# Patient Record
Sex: Female | Born: 1945 | Race: White | Hispanic: No | Smoking: Current some day smoker
Health system: Southern US, Community
[De-identification: ages and names within clinical notes are randomized; demographics above are authoritative.]

## PROBLEM LIST (undated history)

## (undated) DIAGNOSIS — R05 Cough: Secondary | ICD-10-CM

## (undated) DIAGNOSIS — K2270 Barrett's esophagus without dysplasia: Secondary | ICD-10-CM

## (undated) DIAGNOSIS — K2970 Gastritis, unspecified, without bleeding: Secondary | ICD-10-CM

## (undated) DIAGNOSIS — F329 Major depressive disorder, single episode, unspecified: Secondary | ICD-10-CM

## (undated) DIAGNOSIS — J439 Emphysema, unspecified: Secondary | ICD-10-CM

## (undated) DIAGNOSIS — K2980 Duodenitis without bleeding: Secondary | ICD-10-CM

## (undated) DIAGNOSIS — R51 Headache: Secondary | ICD-10-CM

## (undated) DIAGNOSIS — K224 Dyskinesia of esophagus: Secondary | ICD-10-CM

## (undated) DIAGNOSIS — F419 Anxiety disorder, unspecified: Secondary | ICD-10-CM

## (undated) DIAGNOSIS — C50919 Malignant neoplasm of unspecified site of unspecified female breast: Secondary | ICD-10-CM

## (undated) DIAGNOSIS — J449 Chronic obstructive pulmonary disease, unspecified: Secondary | ICD-10-CM

## (undated) DIAGNOSIS — I8393 Asymptomatic varicose veins of bilateral lower extremities: Secondary | ICD-10-CM

## (undated) DIAGNOSIS — F32A Depression, unspecified: Secondary | ICD-10-CM

## (undated) DIAGNOSIS — Z801 Family history of malignant neoplasm of trachea, bronchus and lung: Secondary | ICD-10-CM

## (undated) DIAGNOSIS — C50812 Malignant neoplasm of overlapping sites of left female breast: Secondary | ICD-10-CM

## (undated) DIAGNOSIS — R519 Headache, unspecified: Secondary | ICD-10-CM

## (undated) DIAGNOSIS — I700 Atherosclerosis of aorta: Secondary | ICD-10-CM

## (undated) DIAGNOSIS — R053 Chronic cough: Secondary | ICD-10-CM

## (undated) DIAGNOSIS — Z720 Tobacco use: Secondary | ICD-10-CM

## (undated) DIAGNOSIS — M48062 Spinal stenosis, lumbar region with neurogenic claudication: Secondary | ICD-10-CM

## (undated) DIAGNOSIS — Z170 Estrogen receptor positive status [ER+]: Secondary | ICD-10-CM

## (undated) DIAGNOSIS — K219 Gastro-esophageal reflux disease without esophagitis: Secondary | ICD-10-CM

## (undated) DIAGNOSIS — T7840XA Allergy, unspecified, initial encounter: Secondary | ICD-10-CM

## (undated) DIAGNOSIS — M810 Age-related osteoporosis without current pathological fracture: Secondary | ICD-10-CM

## (undated) DIAGNOSIS — R7303 Prediabetes: Secondary | ICD-10-CM

## (undated) DIAGNOSIS — M1990 Unspecified osteoarthritis, unspecified site: Secondary | ICD-10-CM

## (undated) DIAGNOSIS — Z808 Family history of malignant neoplasm of other organs or systems: Secondary | ICD-10-CM

## (undated) DIAGNOSIS — K222 Esophageal obstruction: Secondary | ICD-10-CM

## (undated) DIAGNOSIS — G8929 Other chronic pain: Secondary | ICD-10-CM

## (undated) DIAGNOSIS — Z800 Family history of malignant neoplasm of digestive organs: Secondary | ICD-10-CM

## (undated) HISTORY — PX: APPENDECTOMY: SHX54

## (undated) HISTORY — PX: EYE SURGERY: SHX253

## (undated) HISTORY — DX: Family history of malignant neoplasm of trachea, bronchus and lung: Z80.1

## (undated) HISTORY — PX: CHOLECYSTECTOMY: SHX55

## (undated) HISTORY — DX: Family history of malignant neoplasm of other organs or systems: Z80.8

## (undated) HISTORY — PX: HERNIA REPAIR: SHX51

## (undated) HISTORY — PX: FRACTURE SURGERY: SHX138

## (undated) HISTORY — DX: Family history of malignant neoplasm of digestive organs: Z80.0

---

## 2012-08-07 ENCOUNTER — Emergency Department: Payer: Self-pay | Admitting: Emergency Medicine

## 2012-08-07 LAB — CBC
HCT: 46.7 % (ref 35.0–47.0)
HGB: 15.6 g/dL (ref 12.0–16.0)
MCH: 30.4 pg (ref 26.0–34.0)
MCHC: 33.5 g/dL (ref 32.0–36.0)
MCV: 91 fL (ref 80–100)
Platelet: 272 10*3/uL (ref 150–440)
RBC: 5.15 10*6/uL (ref 3.80–5.20)
RDW: 13.5 % (ref 11.5–14.5)
WBC: 11.3 10*3/uL — ABNORMAL HIGH (ref 3.6–11.0)

## 2012-08-07 LAB — BASIC METABOLIC PANEL
Anion Gap: 7 (ref 7–16)
BUN: 12 mg/dL (ref 7–18)
Calcium, Total: 9.4 mg/dL (ref 8.5–10.1)
Chloride: 107 mmol/L (ref 98–107)
Co2: 28 mmol/L (ref 21–32)
Creatinine: 0.97 mg/dL (ref 0.60–1.30)
EGFR (African American): >60
EGFR (Non-African Amer.): >60
Glucose: 102 mg/dL — ABNORMAL HIGH (ref 65–99)
Osmolality: 283 (ref 275–301)
Potassium: 4.2 mmol/L (ref 3.5–5.1)
Sodium: 142 mmol/L (ref 136–145)

## 2012-08-07 LAB — CK TOTAL AND CKMB (NOT AT ARMC)
CK, Total: 51 U/L (ref 21–215)
CK-MB: 0.7 ng/mL (ref 0.5–3.6)

## 2012-08-07 LAB — TROPONIN I
Troponin-I: <0.02 ng/mL
Troponin-I: <0.02 ng/mL

## 2012-11-24 ENCOUNTER — Ambulatory Visit: Payer: Self-pay | Admitting: Gastroenterology

## 2012-11-27 LAB — PATHOLOGY REPORT

## 2012-12-05 ENCOUNTER — Ambulatory Visit: Payer: Self-pay | Admitting: Internal Medicine

## 2013-01-03 ENCOUNTER — Ambulatory Visit: Payer: Self-pay | Admitting: Internal Medicine

## 2013-08-07 ENCOUNTER — Ambulatory Visit: Payer: Self-pay | Admitting: Internal Medicine

## 2014-08-14 ENCOUNTER — Ambulatory Visit: Payer: Self-pay | Admitting: Internal Medicine

## 2014-08-21 ENCOUNTER — Ambulatory Visit: Payer: Self-pay | Admitting: Internal Medicine

## 2014-11-14 DIAGNOSIS — T7840XA Allergy, unspecified, initial encounter: Secondary | ICD-10-CM | POA: Diagnosis not present

## 2014-11-14 DIAGNOSIS — G2581 Restless legs syndrome: Secondary | ICD-10-CM | POA: Diagnosis not present

## 2014-11-14 DIAGNOSIS — J3089 Other allergic rhinitis: Secondary | ICD-10-CM | POA: Diagnosis not present

## 2014-11-14 DIAGNOSIS — Z720 Tobacco use: Secondary | ICD-10-CM | POA: Diagnosis not present

## 2015-01-07 DIAGNOSIS — J209 Acute bronchitis, unspecified: Secondary | ICD-10-CM | POA: Diagnosis not present

## 2015-01-13 DIAGNOSIS — J209 Acute bronchitis, unspecified: Secondary | ICD-10-CM | POA: Diagnosis not present

## 2015-01-13 DIAGNOSIS — F328 Other depressive episodes: Secondary | ICD-10-CM | POA: Diagnosis not present

## 2015-02-21 ENCOUNTER — Ambulatory Visit
Admit: 2015-02-21 | Disposition: A | Discharge status: Home / Self Care | Payer: Self-pay | Attending: Internal Medicine | Admitting: Internal Medicine

## 2015-02-21 DIAGNOSIS — N6489 Other specified disorders of breast: Secondary | ICD-10-CM | POA: Diagnosis not present

## 2015-02-21 DIAGNOSIS — N63 Unspecified lump in breast: Secondary | ICD-10-CM | POA: Diagnosis not present

## 2015-03-12 DIAGNOSIS — R600 Localized edema: Secondary | ICD-10-CM | POA: Diagnosis not present

## 2015-03-12 DIAGNOSIS — J302 Other seasonal allergic rhinitis: Secondary | ICD-10-CM | POA: Diagnosis not present

## 2015-03-12 DIAGNOSIS — Z720 Tobacco use: Secondary | ICD-10-CM | POA: Diagnosis not present

## 2015-03-12 DIAGNOSIS — R05 Cough: Secondary | ICD-10-CM | POA: Diagnosis not present

## 2015-04-03 DIAGNOSIS — H5210 Myopia, unspecified eye: Secondary | ICD-10-CM | POA: Diagnosis not present

## 2015-04-03 DIAGNOSIS — Z0100 Encounter for examination of eyes and vision without abnormal findings: Secondary | ICD-10-CM | POA: Diagnosis not present

## 2015-04-03 DIAGNOSIS — H524 Presbyopia: Secondary | ICD-10-CM | POA: Diagnosis not present

## 2015-07-21 DIAGNOSIS — F328 Other depressive episodes: Secondary | ICD-10-CM | POA: Diagnosis not present

## 2015-07-21 DIAGNOSIS — Z720 Tobacco use: Secondary | ICD-10-CM | POA: Diagnosis not present

## 2015-07-21 DIAGNOSIS — R05 Cough: Secondary | ICD-10-CM | POA: Diagnosis not present

## 2015-07-21 DIAGNOSIS — G2581 Restless legs syndrome: Secondary | ICD-10-CM | POA: Diagnosis not present

## 2015-07-21 DIAGNOSIS — J302 Other seasonal allergic rhinitis: Secondary | ICD-10-CM | POA: Diagnosis not present

## 2015-07-21 DIAGNOSIS — K22719 Barrett's esophagus with dysplasia, unspecified: Secondary | ICD-10-CM | POA: Diagnosis not present

## 2015-07-21 DIAGNOSIS — Z79899 Other long term (current) drug therapy: Secondary | ICD-10-CM | POA: Diagnosis not present

## 2015-07-21 DIAGNOSIS — R600 Localized edema: Secondary | ICD-10-CM | POA: Diagnosis not present

## 2015-08-06 ENCOUNTER — Other Ambulatory Visit: Payer: Self-pay | Admitting: Internal Medicine

## 2015-08-06 DIAGNOSIS — T7840XD Allergy, unspecified, subsequent encounter: Secondary | ICD-10-CM | POA: Diagnosis not present

## 2015-08-06 DIAGNOSIS — Z0000 Encounter for general adult medical examination without abnormal findings: Secondary | ICD-10-CM | POA: Diagnosis not present

## 2015-08-06 DIAGNOSIS — R05 Cough: Secondary | ICD-10-CM | POA: Diagnosis not present

## 2015-08-06 DIAGNOSIS — N6320 Unspecified lump in the left breast, unspecified quadrant: Secondary | ICD-10-CM

## 2015-08-06 DIAGNOSIS — K293 Chronic superficial 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: Secondary | ICD-10-CM | POA: Diagnosis not present

## 2015-08-06 DIAGNOSIS — N63 Unspecified lump in breast: Secondary | ICD-10-CM | POA: Diagnosis not present

## 2015-08-06 DIAGNOSIS — F3289 Other specified depressive episodes: Secondary | ICD-10-CM | POA: Diagnosis not present

## 2015-08-06 DIAGNOSIS — Z23 Encounter for immunization: Secondary | ICD-10-CM | POA: Diagnosis not present

## 2015-08-06 DIAGNOSIS — Z720 Tobacco use: Secondary | ICD-10-CM | POA: Diagnosis not present

## 2015-08-06 DIAGNOSIS — J439 Emphysema, unspecified: Secondary | ICD-10-CM | POA: Diagnosis not present

## 2015-08-25 DIAGNOSIS — R197 Diarrhea, unspecified: Secondary | ICD-10-CM | POA: Diagnosis not present

## 2015-08-25 DIAGNOSIS — R110 Nausea: Secondary | ICD-10-CM | POA: Diagnosis not present

## 2015-08-25 DIAGNOSIS — K219 Gastro-esophageal reflux disease without esophagitis: Secondary | ICD-10-CM | POA: Diagnosis not present

## 2015-08-27 ENCOUNTER — Other Ambulatory Visit: Payer: Self-pay | Admitting: Internal Medicine

## 2015-08-27 ENCOUNTER — Ambulatory Visit
Admission: RE | Admit: 2015-08-27 | Discharge: 2015-08-27 | Disposition: A | Discharge status: Home / Self Care | Payer: Commercial Managed Care - HMO | Source: Ambulatory Visit | Attending: Internal Medicine | Admitting: Internal Medicine

## 2015-08-27 DIAGNOSIS — N6320 Unspecified lump in the left breast, unspecified quadrant: Secondary | ICD-10-CM

## 2015-08-27 DIAGNOSIS — N63 Unspecified lump in breast: Secondary | ICD-10-CM | POA: Insufficient documentation

## 2015-09-02 DIAGNOSIS — H2513 Age-related nuclear cataract, bilateral: Secondary | ICD-10-CM | POA: Diagnosis not present

## 2015-10-13 ENCOUNTER — Encounter: Payer: Self-pay | Admitting: *Deleted

## 2015-10-14 ENCOUNTER — Ambulatory Visit: Payer: Commercial Managed Care - HMO | Admitting: Anesthesiology

## 2015-10-14 ENCOUNTER — Encounter: Payer: Self-pay | Admitting: *Deleted

## 2015-10-14 ENCOUNTER — Ambulatory Visit
Admission: RE | Admit: 2015-10-14 | Discharge: 2015-10-14 | Disposition: A | Discharge status: Home / Self Care | Payer: Commercial Managed Care - HMO | Source: Ambulatory Visit | Attending: Gastroenterology | Admitting: Gastroenterology

## 2015-10-14 ENCOUNTER — Encounter
Admission: RE | Disposition: A | Discharge status: Home / Self Care | Payer: Self-pay | Source: Ambulatory Visit | Attending: Gastroenterology

## 2015-10-14 DIAGNOSIS — Z79899 Other long term (current) drug therapy: Secondary | ICD-10-CM | POA: Diagnosis not present

## 2015-10-14 DIAGNOSIS — R110 Nausea: Secondary | ICD-10-CM | POA: Diagnosis not present

## 2015-10-14 DIAGNOSIS — K296 Other 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: Secondary | ICD-10-CM | POA: Diagnosis not present

## 2015-10-14 DIAGNOSIS — K2980 Duodenitis without bleeding: Secondary | ICD-10-CM | POA: Insufficient documentation

## 2015-10-14 DIAGNOSIS — K219 Gastro-esophageal reflux disease without esophagitis: Secondary | ICD-10-CM | POA: Insufficient documentation

## 2015-10-14 DIAGNOSIS — F329 Major depressive disorder, single episode, unspecified: Secondary | ICD-10-CM | POA: Insufficient documentation

## 2015-10-14 DIAGNOSIS — K2970 Gastritis, unspecified, without bleeding: Secondary | ICD-10-CM | POA: Diagnosis not present

## 2015-10-14 DIAGNOSIS — K295 Unspecified chronic 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: Secondary | ICD-10-CM | POA: Insufficient documentation

## 2015-10-14 DIAGNOSIS — Q438 Other specified congenital malformations of intestine: Secondary | ICD-10-CM | POA: Diagnosis not present

## 2015-10-14 HISTORY — DX: Barrett's esophagus without dysplasia: K22.70

## 2015-10-14 HISTORY — DX: Depression, unspecified: F32.A

## 2015-10-14 HISTORY — DX: Major depressive disorder, single episode, unspecified: F32.9

## 2015-10-14 HISTORY — DX: Allergy, unspecified, initial encounter: T78.40XA

## 2015-10-14 HISTORY — DX: Gastro-esophageal reflux disease without esophagitis: K21.9

## 2015-10-14 HISTORY — PX: ESOPHAGOGASTRODUODENOSCOPY (EGD) WITH PROPOFOL: SHX5813

## 2015-10-14 SURGERY — ESOPHAGOGASTRODUODENOSCOPY (EGD) WITH PROPOFOL
Anesthesia: General

## 2015-10-14 MED ORDER — PROPOFOL 500 MG/50ML IV EMUL
INTRAVENOUS | Status: DC | PRN
  Administered 2015-10-14: 150 ug/kg/min via INTRAVENOUS

## 2015-10-14 MED ORDER — FENTANYL CITRATE (PF) 100 MCG/2ML IJ SOLN
INTRAMUSCULAR | Status: DC | PRN
  Administered 2015-10-14: 25 ug via INTRAVENOUS

## 2015-10-14 MED ORDER — SODIUM CHLORIDE 0.9 % IV SOLN
INTRAVENOUS | Status: DC
  Administered 2015-10-14 (×2): via INTRAVENOUS

## 2015-10-14 MED ORDER — SODIUM CHLORIDE 0.9 % IV SOLN: INTRAVENOUS | Status: DC

## 2015-10-14 MED ORDER — LIDOCAINE HCL (PF) 2 % IJ SOLN
INTRAMUSCULAR | Status: DC | PRN
  Administered 2015-10-14: 60 mg via INTRADERMAL

## 2015-10-14 MED ORDER — PROPOFOL 10 MG/ML IV BOLUS
INTRAVENOUS | Status: DC | PRN
  Administered 2015-10-14: 40 mg via INTRAVENOUS
  Administered 2015-10-14: 20 mg via INTRAVENOUS

## 2015-10-14 NOTE — Anesthesia Preprocedure Evaluation (Signed)
Anesthesia Evaluation  Patient identified by MRN, date of birth, ID band Patient awake    Reviewed: Allergy & Precautions, NPO status , Patient's Chart, lab work & pertinent test results, reviewed documented beta blocker date and time   Airway Mallampati: II  TM Distance: >3 FB     Dental  (+) Chipped   Pulmonary Current Smoker,           Cardiovascular      Neuro/Psych PSYCHIATRIC DISORDERS Depression    GI/Hepatic GERD  ,  Endo/Other    Renal/GU      Musculoskeletal   Abdominal   Peds  Hematology   Anesthesia Other Findings   Reproductive/Obstetrics                             Anesthesia Physical Anesthesia Plan  ASA: III  Anesthesia Plan: General   Post-op Pain Management:    Induction:   Airway Management Planned:   Additional Equipment:   Intra-op Plan:   Post-operative Plan:   Informed Consent: I have reviewed the patients History and Physical, chart, labs and discussed the procedure including the risks, benefits and alternatives for the proposed anesthesia with the patient or authorized representative who has indicated his/her understanding and acceptance.     Plan Discussed with: CRNA  Anesthesia Plan Comments:         Anesthesia Quick Evaluation

## 2015-10-14 NOTE — Anesthesia Postprocedure Evaluation (Signed)
Anesthesia Post Note  Patient: Morgan Valdez  Procedure(s) Performed: Procedure(s) (LRB): ESOPHAGOGASTRODUODENOSCOPY (EGD) WITH PROPOFOL (N/A)  Patient location during evaluation: Endoscopy Anesthesia Type: General Level of consciousness: awake Pain management: pain level controlled Vital Signs Assessment: post-procedure vital signs reviewed and stable Respiratory status: spontaneous breathing Cardiovascular status: blood pressure returned to baseline Anesthetic complications: no    Last Vitals:  Filed Vitals:   10/14/15 0900 10/14/15 0910  BP: 102/89 119/65  Pulse: 84 86  Temp:    Resp: 23 21    Last Pain: There were no vitals filed for this visit.               Harshil Cavallaro S

## 2015-10-14 NOTE — Transfer of Care (Signed)
Immediate Anesthesia Transfer of Care Note  Patient: Morgan Valdez  Procedure(s) Performed: Procedure(s): ESOPHAGOGASTRODUODENOSCOPY (EGD) WITH PROPOFOL (N/A)  Patient Location: PACU  Anesthesia Type:General  Level of Consciousness: awake  Airway & Oxygen Therapy: Patient Spontanous Breathing and Patient connected to nasal cannula oxygen  Post-op Assessment: Report given to RN and Post -op Vital signs reviewed and stable  Post vital signs: Reviewed and stable  Last Vitals:  Filed Vitals:   10/14/15 0838 10/14/15 0839  BP: 162/118 162/118  Pulse: 88 88  Temp:    Resp: 14 14    Complications: No apparent anesthesia complications

## 2015-10-14 NOTE — H&P (Signed)
Outpatient short stay form Pre-procedure 10/14/2015 8:05 AM Lollie Sails MD  Primary Physician: Dr. Ginette Pitman  Reason for visit:  EGD  History of present illness:  Patient is a 69 year old female presenting today for EGD. She has had problems with increasing reflux symptoms and switch from a previous medication 1 does not seem be as effective. Also has some nausea perhaps twice a week in the mornings. Is having a history of cholecystectomy in the late 1990s.  She takes no aspirin or blood thinning medications. She was prescribed some Carafate for about a month.   Current facility-administered medications:  .  0.9 %  sodium chloride infusion, , Intravenous, Continuous, Lollie Sails, MD, Last Rate: 20 mL/hr at 10/14/15 0745 .  0.9 %  sodium chloride infusion, , Intravenous, Continuous, Lollie Sails, MD  Prescriptions prior to admission  Medication Sig Dispense Refill Last Dose  . albuterol (PROVENTIL HFA;VENTOLIN HFA) 108 (90 BASE) MCG/ACT inhaler Inhale 2 puffs into the lungs every 6 (six) hours as needed for wheezing or shortness of breath.     . cetirizine (ZYRTEC) 10 MG tablet Take 10 mg by mouth daily.     . fluticasone (FLONASE) 50 MCG/ACT nasal spray Place 2 sprays into both nostrils daily.     . furosemide (LASIX) 20 MG tablet Take 20 mg by mouth daily.     . montelukast (SINGULAIR) 10 MG tablet Take 10 mg by mouth at bedtime.     . pantoprazole (PROTONIX) 40 MG tablet Take 40 mg by mouth daily.     . pramipexole (MIRAPEX) 0.5 MG tablet Take 0.5 mg by mouth 3 (three) times daily.     Marland Kitchen rOPINIRole (REQUIP) 0.25 MG tablet Take 0.25 mg by mouth daily.     . sertraline (ZOLOFT) 50 MG tablet Take 25 mg by mouth daily.     . sucralfate (CARAFATE) 1 G tablet Take 1 g by mouth 4 (four) times daily -  with meals and at bedtime.        Allergies  Allergen Reactions  . Bacitracin      Past Medical History  Diagnosis Date  . Barrett's esophagus   . Depression   .  Allergic state   . GERD (gastroesophageal reflux disease)     Review of systems:      Physical Exam    Heart and lungs: Regular rate and rhythm without rub or gallop, lungs are bilaterally clear.    HEENT: Normocephalic atraumatic eyes are anicteric    Other:     Pertinant exam for procedure: Soft nontender nondistended bowel sounds positive normoactive    Planned proceedures: EGD and indicated procedures. I have discussed the risks benefits and complications of procedures to include not limited to bleeding, infection, perforation and the risk of sedation and the patient wishes to proceed.    Lollie Sails, MD Gastroenterology 10/14/2015  8:05 AM

## 2015-10-14 NOTE — Op Note (Signed)
[facility] Patient Name: Morgan Valdez Procedure Date: 10/14/2015 8:08 AM MRN: [id number] Account #: [id number] Date of Birth: 01/07/1946 Admit Type: Outpatient Age: 69 Room: [facility] ENDO ROOM 3 Gender: Female Note Status: Finalized Procedure:         Upper GI endoscopy Indications:       Gastro-esophageal reflux disease, Nausea Providers:         Lollie Sails, MD Referring MD:      Tracie Harrier, MD (Referring MD) Medicines:         Monitored Anesthesia Care Complications:     No immediate complications. Procedure:         Pre-Anesthesia Assessment:                    - ASA Grade Assessment: III - A patient with severe                     systemic disease.                    After obtaining informed consent, the endoscope was passed                     under direct vision. Throughout the procedure, the                     patient's blood pressure, pulse, and oxygen saturations                     were monitored continuously. The Endoscope was introduced                     through the mouth, and advanced to the third part of                     duodenum. The upper GI endoscopy was accomplished without                     difficulty. The patient tolerated the procedure well. Findings:      The Z-line was variable. Biopsies were taken with a cold forceps for       histology.      Abnormal motility was noted in the lower third of the esophagus. The       cricopharyngeus was normal. There is spasticity of the esophageal body.       The distal esophagus/lower esophageal sphincter is open. Tertiary       peristaltic waves are noted.      Diffuse and patchy mild inflammation characterized by congestion (edema)       and erythema was found in the gastric body and in the gastric antrum.       Biopsies were taken with a cold forceps for histology.      Patchy mucosal flattening was found at 2nd part of the duodenum and at       3rd part of the  duodenum. Biopsies were taken with a cold forceps for       histology.      Localized mild inflammation characterized by erythema was found in the       duodenal bulb. Biopsies were taken with a cold forceps for histology. Impression:        - Z-line variable. Biopsied.                    -  Esophageal motility disorder.                    - Gastritis. Biopsied.                    - Flattened mucosa was found in the duodenum, not                     consistent with celiac disease. Biopsied.                    - Duodenitis. Biopsied. Recommendation:    - Discharge patient to home.                    - Use Prilosec (omeprazole) 40 mg PO daily daily.                    - Return to GI clinic in 1 month. Procedure Code(s): --- Professional ---                    [phone], Esophagogastroduodenoscopy, flexible, transoral;                     with biopsy, single or multiple Diagnosis Code(s): --- Professional ---                    K22.8, Other specified diseases of esophagus                    K22.4, Dyskinesia of esophagus                    K29.70, Gastritis, unspecified, without bleeding                    K31.89, Other diseases of stomach and duodenum                    K29.80, Duodenitis without bleeding                    K21.9, Gastro-esophageal reflux disease without esophagitis                    R11.0, Nausea CPT copyright 2014 [employer]. All rights reserved. The codes documented in this report are preliminary and upon coder review may  be revised to meet current compliance requirements. Lollie Sails, MD 10/14/2015 8:43:44 AM This report has been signed electronically. Number of Addenda: 0 Note Initiated On: 10/14/2015 8:08 AM      [facility]

## 2015-10-15 ENCOUNTER — Encounter: Payer: Self-pay | Admitting: Gastroenterology

## 2015-10-16 LAB — SURGICAL PATHOLOGY

## 2015-11-12 DIAGNOSIS — H04123 Dry eye syndrome of bilateral lacrimal glands: Secondary | ICD-10-CM | POA: Diagnosis not present

## 2015-12-03 DIAGNOSIS — K2970 Gastritis, unspecified, without bleeding: Secondary | ICD-10-CM | POA: Diagnosis not present

## 2015-12-10 DIAGNOSIS — M545 Low back pain: Secondary | ICD-10-CM | POA: Diagnosis not present

## 2015-12-10 DIAGNOSIS — G2581 Restless legs syndrome: Secondary | ICD-10-CM | POA: Diagnosis not present

## 2015-12-10 DIAGNOSIS — Z720 Tobacco use: Secondary | ICD-10-CM | POA: Diagnosis not present

## 2015-12-10 DIAGNOSIS — F325 Major depressive disorder, single episode, in full remission: Secondary | ICD-10-CM | POA: Diagnosis not present

## 2015-12-10 DIAGNOSIS — G8929 Other chronic pain: Secondary | ICD-10-CM | POA: Diagnosis not present

## 2015-12-10 DIAGNOSIS — E7800 Pure hypercholesterolemia, unspecified: Secondary | ICD-10-CM | POA: Diagnosis not present

## 2016-04-01 DIAGNOSIS — G8929 Other chronic pain: Secondary | ICD-10-CM | POA: Diagnosis not present

## 2016-04-01 DIAGNOSIS — M545 Low back pain: Secondary | ICD-10-CM | POA: Diagnosis not present

## 2016-04-01 DIAGNOSIS — G2581 Restless legs syndrome: Secondary | ICD-10-CM | POA: Diagnosis not present

## 2016-04-01 DIAGNOSIS — E7800 Pure hypercholesterolemia, unspecified: Secondary | ICD-10-CM | POA: Diagnosis not present

## 2016-04-01 DIAGNOSIS — Z720 Tobacco use: Secondary | ICD-10-CM | POA: Diagnosis not present

## 2016-04-08 DIAGNOSIS — F3289 Other specified depressive episodes: Secondary | ICD-10-CM | POA: Diagnosis not present

## 2016-04-08 DIAGNOSIS — G2581 Restless legs syndrome: Secondary | ICD-10-CM | POA: Diagnosis not present

## 2016-04-08 DIAGNOSIS — G8929 Other chronic pain: Secondary | ICD-10-CM | POA: Diagnosis not present

## 2016-04-08 DIAGNOSIS — Z720 Tobacco use: Secondary | ICD-10-CM | POA: Diagnosis not present

## 2016-04-08 DIAGNOSIS — M545 Low back pain: Secondary | ICD-10-CM | POA: Diagnosis not present

## 2016-04-08 DIAGNOSIS — J309 Allergic rhinitis, unspecified: Secondary | ICD-10-CM | POA: Diagnosis not present

## 2016-04-08 DIAGNOSIS — N393 Stress incontinence (female) (male): Secondary | ICD-10-CM | POA: Diagnosis not present

## 2016-05-04 ENCOUNTER — Encounter: Payer: Self-pay | Admitting: Emergency Medicine

## 2016-05-04 ENCOUNTER — Emergency Department
Admission: EM | Admit: 2016-05-04 | Discharge: 2016-05-04 | Disposition: A | Discharge status: Home / Self Care | Payer: Commercial Managed Care - HMO | Attending: Emergency Medicine | Admitting: Emergency Medicine

## 2016-05-04 ENCOUNTER — Emergency Department: Payer: Commercial Managed Care - HMO

## 2016-05-04 DIAGNOSIS — R05 Cough: Secondary | ICD-10-CM | POA: Diagnosis not present

## 2016-05-04 DIAGNOSIS — Z79899 Other long term (current) drug therapy: Secondary | ICD-10-CM | POA: Diagnosis not present

## 2016-05-04 DIAGNOSIS — F17200 Nicotine dependence, unspecified, uncomplicated: Secondary | ICD-10-CM | POA: Insufficient documentation

## 2016-05-04 DIAGNOSIS — R0602 Shortness of breath: Secondary | ICD-10-CM | POA: Diagnosis not present

## 2016-05-04 DIAGNOSIS — R0789 Other chest pain: Secondary | ICD-10-CM | POA: Diagnosis not present

## 2016-05-04 DIAGNOSIS — Z7951 Long term (current) use of inhaled steroids: Secondary | ICD-10-CM | POA: Insufficient documentation

## 2016-05-04 DIAGNOSIS — F329 Major depressive disorder, single episode, unspecified: Secondary | ICD-10-CM | POA: Diagnosis not present

## 2016-05-04 DIAGNOSIS — R079 Chest pain, unspecified: Secondary | ICD-10-CM | POA: Diagnosis not present

## 2016-05-04 DIAGNOSIS — R059 Cough, unspecified: Secondary | ICD-10-CM

## 2016-05-04 DIAGNOSIS — Z8719 Personal history of other diseases of the digestive system: Secondary | ICD-10-CM | POA: Diagnosis not present

## 2016-05-04 LAB — COMPREHENSIVE METABOLIC PANEL
ALT: 20 U/L (ref 14–54)
AST: 21 U/L (ref 15–41)
Albumin: 4.1 g/dL (ref 3.5–5.0)
Alkaline Phosphatase: 66 U/L (ref 38–126)
Anion gap: 8 (ref 5–15)
BUN: 13 mg/dL (ref 6–20)
CO2: 27 mmol/L (ref 22–32)
Calcium: 9.2 mg/dL (ref 8.9–10.3)
Chloride: 103 mmol/L (ref 101–111)
Creatinine, Ser: 0.87 mg/dL (ref 0.44–1.00)
GFR calc Af Amer: >60 mL/min (ref >60)
GFR calc non Af Amer: >60 mL/min (ref >60)
Glucose, Bld: 114 mg/dL — ABNORMAL HIGH (ref 65–99)
Potassium: 4 mmol/L (ref 3.5–5.1)
Sodium: 138 mmol/L (ref 135–145)
Total Bilirubin: 0.5 mg/dL (ref 0.3–1.2)
Total Protein: 7.1 g/dL (ref 6.5–8.1)

## 2016-05-04 LAB — TROPONIN I
Troponin I: <0.03 ng/mL (ref <0.03)
Troponin I: <0.03 ng/mL (ref <0.03)

## 2016-05-04 LAB — CBC WITH DIFFERENTIAL/PLATELET
Basophils Absolute: 0.1 10*3/uL (ref 0–0.1)
Basophils Relative: 1 %
Eosinophils Absolute: 0.1 10*3/uL (ref 0–0.7)
Eosinophils Relative: 1 %
HCT: 48.3 % — ABNORMAL HIGH (ref 35.0–47.0)
Hemoglobin: 16.4 g/dL — ABNORMAL HIGH (ref 12.0–16.0)
Lymphocytes Relative: 25 %
Lymphs Abs: 2.7 10*3/uL (ref 1.0–3.6)
MCH: 30.2 pg (ref 26.0–34.0)
MCHC: 34 g/dL (ref 32.0–36.0)
MCV: 89.1 fL (ref 80.0–100.0)
Monocytes Absolute: 0.8 10*3/uL (ref 0.2–0.9)
Monocytes Relative: 7 %
Neutro Abs: 7 10*3/uL — ABNORMAL HIGH (ref 1.4–6.5)
Neutrophils Relative %: 66 %
Platelets: 244 10*3/uL (ref 150–440)
RBC: 5.42 MIL/uL — ABNORMAL HIGH (ref 3.80–5.20)
RDW: 14.2 % (ref 11.5–14.5)
WBC: 10.6 10*3/uL (ref 3.6–11.0)

## 2016-05-04 LAB — FIBRIN DERIVATIVES D-DIMER (ARMC ONLY): Fibrin derivatives D-dimer (ARMC): 416 (ref 0–499)

## 2016-05-04 LAB — PROTIME-INR
INR: 0.99
Prothrombin Time: 13.3 seconds (ref 11.4–15.0)

## 2016-05-04 LAB — LIPASE, BLOOD: Lipase: 20 U/L (ref 11–51)

## 2016-05-04 MED ORDER — ASPIRIN 81 MG PO CHEW
324.0000 mg | CHEWABLE_TABLET | Freq: Once | ORAL | Status: AC
  Administered 2016-05-04: 324 mg via ORAL
  Filled 2016-05-04: qty 4

## 2016-05-04 MED ORDER — ALBUTEROL SULFATE (2.5 MG/3ML) 0.083% IN NEBU
5.0000 mg | INHALATION_SOLUTION | Freq: Once | RESPIRATORY_TRACT | Status: AC
  Administered 2016-05-04: 5 mg via RESPIRATORY_TRACT
  Filled 2016-05-04: qty 6

## 2016-05-04 MED ORDER — IPRATROPIUM BROMIDE 0.02 % IN SOLN
0.5000 mg | Freq: Once | RESPIRATORY_TRACT | Status: AC
  Administered 2016-05-04: 0.5 mg via RESPIRATORY_TRACT
  Filled 2016-05-04: qty 2.5

## 2016-05-04 MED ORDER — BENZONATATE 100 MG PO CAPS: 100.0000 mg | ORAL_CAPSULE | Freq: Four times a day (QID) | ORAL | Status: DC | PRN

## 2016-05-04 MED ORDER — ALBUTEROL SULFATE HFA 108 (90 BASE) MCG/ACT IN AERS: 2.0000 | INHALATION_SPRAY | Freq: Four times a day (QID) | RESPIRATORY_TRACT | Status: DC | PRN

## 2016-05-04 MED ORDER — KETOROLAC TROMETHAMINE 30 MG/ML IJ SOLN
15.0000 mg | Freq: Once | INTRAMUSCULAR | Status: AC
  Administered 2016-05-04: 15 mg via INTRAVENOUS
  Filled 2016-05-04: qty 1

## 2016-05-04 MED ORDER — IOPAMIDOL (ISOVUE-370) INJECTION 76%
100.0000 mL | Freq: Once | INTRAVENOUS | Status: AC | PRN
  Administered 2016-05-04: 100 mL via INTRAVENOUS

## 2016-05-04 NOTE — ED Provider Notes (Addendum)
[facility] Emergency Department Provider Note  ____________________________________________   I have reviewed the triage vital signs and the nursing notes.   HISTORY  Chief Complaint Chest Pain    HPI Morgan Valdez is a 70 y.o. female presents today complaining of very vague with described chest discomfort. Patient states that she has a history of panic attacks. She states this is very similar pain attack but in some ways different in its very difficult for her to tell the difference. Patient states that she has not had a panic attack in several years. She states that she has a generalized chest tightness "like someone is giving me a hug,", the pain seems to be also in her back. Upper back. She has not had any fever or chills. She did have some mild shortness of breath. This is all consistent with a panic attack that she has had before, however again this feels somewhat different as well. Patient also has a history of Barrett's esophagus, no history of anxiety and his estrogens no history of recent surgery, no personal or family history of PE or DVT no leg swelling no recent travel, no risk factors for PE and otherwise. Patient states sometimes the pain is worse when she takes deep breath and sometimes it does not. Pain is also worse when she touches her chest wall or changes position. She does not recall any injury. It feels like a muscle pain in her chest she states. She is had no exertional symptoms. The pain is made worse by changing position, touching her chest. she does admit to coughing which is occasionally productive. She has not had any other URI symptoms. Denies fever or chills. Coughing makes it worse and in fact she believes that she was coughing at the time that this started. Somewhat vague historian.       Past Medical History  Diagnosis Date  . Barrett's esophagus   . Depression   . Allergic state   . GERD (gastroesophageal reflux disease)      There are no active problems to display for this patient.   Past Surgical History  Procedure Laterality Date  . Appendectomy    . Cholecystectomy    . Hernia repair    . Esophagogastroduodenoscopy (egd) with propofol N/A 10/14/2015    Procedure: ESOPHAGOGASTRODUODENOSCOPY (EGD) WITH PROPOFOL;  Surgeon: Lollie Sails, MD;  Location: [facility] ENDOSCOPY;  Service: Endoscopy;  Laterality: N/A;    Current Outpatient Rx  Name  Route  Sig  Dispense  Refill  . albuterol (PROVENTIL HFA;VENTOLIN HFA) 108 (90 BASE) MCG/ACT inhaler   Inhalation   Inhale 2 puffs into the lungs every 6 (six) hours as needed for wheezing or shortness of breath.         . cetirizine (ZYRTEC) 10 MG tablet   Oral   Take 10 mg by mouth daily.         . fluticasone (FLONASE) 50 MCG/ACT nasal spray   Each Nare   Place 2 sprays into both nostrils daily.         . furosemide (LASIX) 20 MG tablet   Oral   Take 20 mg by mouth daily.         . montelukast (SINGULAIR) 10 MG tablet   Oral   Take 10 mg by mouth at bedtime.         . pantoprazole (PROTONIX) 40 MG tablet   Oral   Take 40 mg by mouth daily.         Marland Kitchen  pramipexole (MIRAPEX) 0.5 MG tablet   Oral   Take 0.5 mg by mouth 3 (three) times daily.         Marland Kitchen rOPINIRole (REQUIP) 0.25 MG tablet   Oral   Take 0.25 mg by mouth daily.         . sertraline (ZOLOFT) 50 MG tablet   Oral   Take 25 mg by mouth daily.         . sucralfate (CARAFATE) 1 G tablet   Oral   Take 1 g by mouth 4 (four) times daily -  with meals and at bedtime.           Allergies Bacitracin  No family history on file.  Social History Social History  Substance Use Topics  . Smoking status: Current Some Day Smoker -- 0.50 packs/day  . Smokeless tobacco: Never Used  . Alcohol Use: No    Review of Systems  Constitutional: No fever/chills Eyes: No visual changes. ENT: No sore throat. No stiff neck no neck pain Cardiovascular: Positive vaguely  described chest discomfort Respiratory: Positive cough and mild shortness of breath Gastrointestinal:   no vomiting.  No diarrhea.  No constipation. Genitourinary: Negative for dysuria. Musculoskeletal: Negative lower extremity swelling Skin: Negative for rash. Neurological: Negative for headaches, focal weakness or numbness. 10-point ROS otherwise negative.  ____________________________________________   PHYSICAL EXAM:  VITAL SIGNS: ED Triage Vitals  Enc Vitals Group     BP 05/04/16 1535 143/61 mmHg     Pulse Rate 05/04/16 1535 85     Resp 05/04/16 1535 18     Temp 05/04/16 1535 98.4 F (36.9 C)     Temp Source 05/04/16 1535 Oral     SpO2 05/04/16 1535 96 %     Weight 05/04/16 1535 170 lb (77.111 kg)     Height 05/04/16 1535 5\' 7"  (1.702 m)     Head Cir --      Peak Flow --      Pain Score --      Pain Loc --      Pain Edu? --      Excl. in [place]? --     Constitutional: Alert and oriented. Well appearing and in no acute distress. Eyes: Conjunctivae are normal. PERRL. EOMI. Head: Atraumatic. Nose: No congestion/rhinnorhea. Mouth/Throat: Mucous membranes are moist.  Oropharynx non-erythematous. Neck: No stridor.   Nontender with no meningismus Cardiovascular: Normal rate, regular rhythm. Grossly normal heart sounds.  Good peripheral circulation. Respiratory: Normal respiratory effort.  No retractions. Lungs CTAB. Abdominal: Soft and nontender. No distention. No guarding no rebound Chest: Tender to palpation of the chest wall anteriorly, when I palpate this area patient says "ouch that's the pain right there" and pulls back. No crepitus no flail chest, no rib fracture palpated. Patient also has pain when she pulls up in the bed. Back:  There is no focal tenderness or step off there is no midline tenderness there are no lesions noted. there is no CVA tenderness3 Musculoskeletal: No lower extremity tenderness. No joint effusions, no DVT signs strong distal pulses no  edema Neurologic:  Normal speech and language. No gross focal neurologic deficits are appreciated.  Skin:  Skin is warm, dry and intact. No rash noted. Psychiatric: Mood and affect are normal. Speech and behavior are normal.  ____________________________________________   LABS (all labs ordered are listed, but only abnormal results are displayed)  Labs Reviewed  CBC WITH DIFFERENTIAL/PLATELET - Abnormal; Notable for the following:    RBC  5.42 (*)    Hemoglobin 16.4 (*)    HCT 48.3 (*)    Neutro Abs 7.0 (*)    All other components within normal limits  COMPREHENSIVE METABOLIC PANEL - Abnormal; Notable for the following:    Glucose, Bld 114 (*)    All other components within normal limits  TROPONIN I  FIBRIN DERIVATIVES D-DIMER ([facility] ONLY)  PROTIME-INR  LIPASE, BLOOD   ____________________________________________  EKG  I personally interpreted any EKGs ordered by me or triage Sinus rhythm rate 86 bpm no acute ST elevation or acute ST depression normal axis unremarkable EKG ____________________________________________  RADIOLOGY  I reviewed any imaging ordered by me or triage that were performed during my shift and, if possible, patient and/or family made aware of any abnormal findings. ____________________________________________   PROCEDURES  Procedure(s) performed: None  Critical Care performed: None  ____________________________________________   INITIAL IMPRESSION / ASSESSMENT AND PLAN / ED COURSE  Pertinent labs & imaging results that were available during my care of the patient were reviewed by me and considered in my medical decision making (see chart for details).  Patient with reproducible chest wall pain in the context of cough and history of panic attacks however given her age, I did send a d-dimer. I have low pretest for really for PE and her d-dimer is negative. However, patient also complains of pain that seems to be going towards her back. Her age,  obviously she is at some risk for dissection. I have however low suspicion of that entity. We will obtain imaging to rule it out. Patient is much more controlled this time after breathing treatment. At this time, the most likely diagnosis is reproducible chest wall pain in the context of cough however, we will continue to evaluate the patient. I will send a second troponin, CT and troponin are negative is my hope that we can get the patient's sibling, which is her strong preference.  ----------------------------------------- 7:59 PM on 05/04/2016 -----------------------------------------  Patient sleeping in no acute distress, exhaustive workup here unremarkable. Patient feels much better after Toradol. She does understand the limitations of the workup in the emergency room. I've offered her admission to the hospital and she declines. She understands the risk of going home and would prefer to leave. Extensive return precautions and follow-up given and understood. At this time, there does not appear to be clinical evidence to support the diagnosis of pulmonary embolus, dissection, myocarditis, endocarditis, pericarditis, pericardial tamponade, acute coronary syndrome, pneumothorax, pneumonia, or any other acute intrathoracic pathology that will require admission or acute intervention. Nor is there evidence of any significant intra-abdominal pathology causing this discomfort. ____________________________________________   FINAL CLINICAL IMPRESSION(S) / ED DIAGNOSES  Final diagnoses:  None      This chart was dictated using voice recognition software.  Despite best efforts to proofread,  errors can occur which can change meaning.     Schuyler Amor, MD 05/04/16 [place], MD 05/04/16 2000

## 2016-05-04 NOTE — ED Notes (Signed)
Pt arrived via EMS from home with reports of substernal chest pain. Pt does not report radiation to arm or jaw but reports increased pain when she takes a deep breath. EMS reports VS WNL, NSR.

## 2016-05-04 NOTE — Discharge Instructions (Signed)
Cardiac-Specific Troponin I and T Test WHY AM I HAVING THIS TEST? You may have this test if you have experienced chest pain. The test can be used to determine if you have had a heart attack or injury to heart (cardiac) muscle. This test can also help predict the possibility of future heart attacks. This test measures the concentration of cardiac-specific troponin in your blood. Troponins are proteins that help muscles contract. There are three forms of troponin, including troponins C, I, and T. The types of troponins I and T that are found in cardiac muscle are different from the troponins I and T that are found in skeletal muscle. Therefore, testing can be done for cardiac-specific troponins I and T. These types of troponin are normally present in very small quantities in the blood. When there is damage to heart muscle cells, cardiac troponins I and T are released into circulation. The more damage there is, the greater the concentration of troponins I and T. When a person has a heart attack, levels of troponin can become elevated in the blood within 3-4 hours after injury and may remain elevated for 10-14 days. WHAT KIND OF SAMPLE IS TAKEN? A blood sample is required for this test. It is usually collected by inserting a needle into a vein. Usually, an initial blood sample is collected, and then another blood sample is collected 12 hours later. After these samples, you will have your blood tested daily for 3-5 days. You might also have it tested weekly for 5-6 weeks. HOW DO I PREPARE FOR THE TEST? There is no preparation required for this test. However, be aware that you will need to make arrangements to have your blood collected frequently.  WHAT ARE THE REFERENCE RANGES? Reference values are considered healthy values established after testing a large group of healthy people. Reference values may vary among different people, labs, and hospitals. It is your responsibility to obtain your test results. Ask  the lab or department performing the test when and how you will get your results. Reference values for cardiac troponins are as follows:  Cardiac troponin T: less than 0.1 ng/mL.  Cardiac troponin I: less than 0.03 ng/mL. WHAT DO THE RESULTS MEAN? Troponin values above the reference values may indicate:  Injury to the heart muscle.  Heart attack. Talk with your health care provider to discuss your results, treatment options, and if necessary, the need for more tests. Talk with your health care provider if you have any questions about your results.   This information is not intended to replace advice given to you by your health care provider. Make sure you discuss any questions you have with your health care provider.   Document Released: 11/20/2004 Document Revised: 11/08/2014 Document Reviewed: 03/13/2014 Elsevier Interactive Patient Education 2016 [employer]  Chest Wall Pain Chest wall pain is pain in or around the bones and muscles of your chest. Sometimes, an injury causes this pain. Sometimes, the cause may not be known. This pain may take several weeks or longer to get better. HOME CARE INSTRUCTIONS  Pay attention to any changes in your symptoms. Take these actions to help with your pain:   Rest as told by your health care provider.   Avoid activities that cause pain. These include any activities that use your chest muscles or your abdominal and side muscles to lift heavy items.   If directed, apply ice to the painful area:  Put ice in a plastic bag.  Place a towel between your  skin and the bag.  Leave the ice on for 20 minutes, 2-3 times per day.  Take over-the-counter and prescription medicines only as told by your health care provider.  Do not use tobacco products, including cigarettes, chewing tobacco, and e-cigarettes. If you need help quitting, ask your health care provider.  Keep all follow-up visits as told by your health care provider. This is  important. SEEK MEDICAL CARE IF:  You have a fever.  Your chest pain becomes worse.  You have new symptoms. SEEK IMMEDIATE MEDICAL CARE IF:  You have nausea or vomiting.  You feel sweaty or light-headed.  You have a cough with phlegm (sputum) or you cough up blood.  You develop shortness of breath.   This information is not intended to replace advice given to you by your health care provider. Make sure you discuss any questions you have with your health care provider.   Document Released: 10/18/2005 Document Revised: 07/09/2015 Document Reviewed: 01/13/2015 Elsevier Interactive Patient Education 2016 [place].  Chest Pain Observation It is often hard to give a specific diagnosis for the cause of chest pain. Among other possibilities your symptoms might be caused by inadequate oxygen delivery to your heart (angina). Angina that is not treated or evaluated can lead to a heart attack (myocardial infarction) or death. Blood tests, electrocardiograms, and X-rays may have been done to help determine a possible cause of your chest pain. After evaluation and observation, your health care provider has determined that it is unlikely your pain was caused by an unstable condition that requires hospitalization. However, a full evaluation of your pain may need to be completed, with additional diagnostic testing as directed. It is very important to keep your follow-up appointments. Not keeping your follow-up appointments could result in permanent heart damage, disability, or death. If there is any problem keeping your follow-up appointments, you must call your health care provider. HOME CARE INSTRUCTIONS  Due to the slight chance that your pain could be angina, it is important to follow your health care provider's treatment plan and also maintain a healthy lifestyle:  Maintain or work toward achieving a healthy weight.  Stay physically active and exercise regularly.  Decrease your salt  intake.  Eat a balanced, healthy diet. Talk to a dietitian to learn about heart-healthy foods.  Increase your fiber intake by including whole grains, vegetables, fruits, and nuts in your diet.  Avoid situations that cause stress, anger, or depression.  Take medicines as advised by your health care provider. Report any side effects to your health care provider. Do not stop medicines or adjust the dosages on your own.  Quit smoking. Do not use nicotine patches or gum until you check with your health care provider.  Keep your blood pressure, blood sugar, and cholesterol levels within normal limits.  Limit alcohol intake to no more than 1 drink per day for women who are not pregnant and 2 drinks per day for men.  Do not abuse drugs. SEEK IMMEDIATE MEDICAL CARE IF: You have severe chest pain or pressure which may include symptoms such as:  You feel pain or pressure in your arms, neck, jaw, or back.  You have severe back or abdominal pain, feel sick to your stomach (nauseous), or throw up (vomit).  You are sweating profusely.  You are having a fast or irregular heartbeat.  You feel short of breath while at rest.  You notice increasing shortness of breath during rest, sleep, or with activity.  You have chest  pain that does not get better after rest or after taking your usual medicine.  You wake from sleep with chest pain.  You are unable to sleep because you cannot breathe.  You develop a frequent cough or you are coughing up blood.  You feel dizzy, faint, or experience extreme fatigue.  You develop severe weakness, dizziness, fainting, or chills. Any of these symptoms may represent a serious problem that is an emergency. Do not wait to see if the symptoms will go away. Call your local emergency services (911 in the [place] Do not drive yourself to the hospital. MAKE SURE YOU:  Understand these instructions.  Will watch your condition.  Will get help right away if you are  not doing well or get worse.   This information is not intended to replace advice given to you by your health care provider. Make sure you discuss any questions you have with your health care provider.   Document Released: 11/20/2010 Document Revised: 10/23/2013 Document Reviewed: 04/19/2013 Elsevier Interactive Patient Education [employer].

## 2016-05-06 DIAGNOSIS — R0602 Shortness of breath: Secondary | ICD-10-CM | POA: Diagnosis not present

## 2016-05-06 DIAGNOSIS — F3289 Other specified depressive episodes: Secondary | ICD-10-CM | POA: Diagnosis not present

## 2016-05-06 DIAGNOSIS — Z720 Tobacco use: Secondary | ICD-10-CM | POA: Diagnosis not present

## 2016-05-06 DIAGNOSIS — Z09 Encounter for follow-up examination after completed treatment for conditions other than malignant neoplasm: Secondary | ICD-10-CM | POA: Diagnosis not present

## 2016-05-17 DIAGNOSIS — R0602 Shortness of breath: Secondary | ICD-10-CM | POA: Diagnosis not present

## 2016-09-07 DIAGNOSIS — J45909 Unspecified asthma, uncomplicated: Secondary | ICD-10-CM | POA: Diagnosis not present

## 2016-09-07 DIAGNOSIS — Z720 Tobacco use: Secondary | ICD-10-CM | POA: Diagnosis not present

## 2016-09-07 DIAGNOSIS — G2581 Restless legs syndrome: Secondary | ICD-10-CM | POA: Diagnosis not present

## 2016-09-21 DIAGNOSIS — J40 Bronchitis, not specified as acute or chronic: Secondary | ICD-10-CM | POA: Diagnosis not present

## 2016-09-21 DIAGNOSIS — R05 Cough: Secondary | ICD-10-CM | POA: Diagnosis not present

## 2016-09-21 DIAGNOSIS — J439 Emphysema, unspecified: Secondary | ICD-10-CM | POA: Diagnosis not present

## 2016-11-04 ENCOUNTER — Other Ambulatory Visit: Payer: Self-pay | Admitting: Internal Medicine

## 2016-11-04 DIAGNOSIS — N6320 Unspecified lump in the left breast, unspecified quadrant: Secondary | ICD-10-CM

## 2016-11-04 DIAGNOSIS — Z1239 Encounter for other screening for malignant neoplasm of breast: Secondary | ICD-10-CM

## 2016-11-08 DIAGNOSIS — E785 Hyperlipidemia, unspecified: Secondary | ICD-10-CM | POA: Diagnosis not present

## 2016-11-08 DIAGNOSIS — H538 Other visual disturbances: Secondary | ICD-10-CM | POA: Diagnosis not present

## 2016-11-08 DIAGNOSIS — M545 Low back pain: Secondary | ICD-10-CM | POA: Diagnosis not present

## 2016-11-08 DIAGNOSIS — F3289 Other specified depressive episodes: Secondary | ICD-10-CM | POA: Diagnosis not present

## 2016-11-08 DIAGNOSIS — G8929 Other chronic pain: Secondary | ICD-10-CM | POA: Diagnosis not present

## 2016-11-08 DIAGNOSIS — Z1231 Encounter for screening mammogram for malignant neoplasm of breast: Secondary | ICD-10-CM | POA: Diagnosis not present

## 2016-11-08 DIAGNOSIS — Z0000 Encounter for general adult medical examination without abnormal findings: Secondary | ICD-10-CM | POA: Diagnosis not present

## 2016-11-08 DIAGNOSIS — Z720 Tobacco use: Secondary | ICD-10-CM | POA: Diagnosis not present

## 2016-12-01 DIAGNOSIS — H2513 Age-related nuclear cataract, bilateral: Secondary | ICD-10-CM | POA: Diagnosis not present

## 2016-12-08 DIAGNOSIS — R1310 Dysphagia, unspecified: Secondary | ICD-10-CM | POA: Diagnosis not present

## 2016-12-10 ENCOUNTER — Other Ambulatory Visit: Payer: Self-pay | Admitting: Gastroenterology

## 2016-12-10 DIAGNOSIS — R1310 Dysphagia, unspecified: Secondary | ICD-10-CM

## 2016-12-17 ENCOUNTER — Ambulatory Visit
Admission: RE | Admit: 2016-12-17 | Discharge: 2016-12-17 | Disposition: A | Discharge status: Home / Self Care | Payer: Commercial Managed Care - HMO | Source: Ambulatory Visit | Attending: Internal Medicine | Admitting: Internal Medicine

## 2016-12-17 DIAGNOSIS — N6320 Unspecified lump in the left breast, unspecified quadrant: Secondary | ICD-10-CM

## 2016-12-17 DIAGNOSIS — Z1239 Encounter for other screening for malignant neoplasm of breast: Secondary | ICD-10-CM

## 2016-12-17 DIAGNOSIS — Z0000 Encounter for general adult medical examination without abnormal findings: Secondary | ICD-10-CM | POA: Insufficient documentation

## 2016-12-17 DIAGNOSIS — D242 Benign neoplasm of left breast: Secondary | ICD-10-CM | POA: Insufficient documentation

## 2016-12-17 DIAGNOSIS — N6321 Unspecified lump in the left breast, upper outer quadrant: Secondary | ICD-10-CM | POA: Diagnosis not present

## 2016-12-17 DIAGNOSIS — R928 Other abnormal and inconclusive findings on diagnostic imaging of breast: Secondary | ICD-10-CM | POA: Diagnosis not present

## 2016-12-21 DIAGNOSIS — H2513 Age-related nuclear cataract, bilateral: Secondary | ICD-10-CM | POA: Diagnosis not present

## 2016-12-27 ENCOUNTER — Ambulatory Visit: Admission: RE | Admit: 2016-12-27 | Payer: Commercial Managed Care - HMO | Source: Ambulatory Visit

## 2016-12-28 ENCOUNTER — Encounter: Payer: Self-pay | Admitting: *Deleted

## 2016-12-31 NOTE — Discharge Instructions (Signed)
Cataract Surgery, Care After °Refer to this sheet in the next few weeks. These instructions provide you with information about caring for yourself after your procedure. Your health care provider may also give you more specific instructions. Your treatment has been planned according to current medical practices, but problems sometimes occur. Call your health care provider if you have any problems or questions after your procedure. °What can I expect after the procedure? °After the procedure, it is common to have: °· Itching. °· Discomfort. °· Fluid discharge. °· Sensitivity to light and to touch. °· Bruising. °Follow these instructions at home: °Eye Care  °· Check your eye every day for signs of infection. Watch for: °¨ Redness, swelling, or pain. °¨ Fluid, blood, or pus. °¨ Warmth. °¨ Bad smell. °Activity  °· Avoid strenuous activities, such as playing contact sports, for as long as told by your health care provider. °· Do not drive or operate heavy machinery until your health care provider approves. °· Do not bend or lift heavy objects . Bending increases pressure in the eye. You can walk, climb stairs, and do light household chores. °· Ask your health care provider when you can return to work. If you work in a dusty environment, you may be advised to wear protective eyewear for a period of time. °General instructions  °· Take or apply over-the-counter and prescription medicines only as told by your health care provider. This includes eye drops. °· Do not touch or rub your eyes. °· If you were given a protective shield, wear it as told by your health care provider. If you were not given a protective shield, wear sunglasses as told by your health care provider to protect your eyes. °· Keep the area around your eye clean and dry. Avoid swimming or allowing water to hit you directly in the face while showering until told by your health care provider. Keep soap and shampoo out of your eyes. °· Do not put a contact lens  into the affected eye or eyes until your health care provider approves. °· Keep all follow-up visits as told by your health care provider. This is important. °Contact a health care provider if: ° °· You have increased bruising around your eye. °· You have pain that is not helped with medicine. °· You have a fever. °· You have redness, swelling, or pain in your eye. °· You have fluid, blood, or pus coming from your incision. °· Your vision gets worse. °Get help right away if: °· You have sudden vision loss. °This information is not intended to replace advice given to you by your health care provider. Make sure you discuss any questions you have with your health care provider. °Document Released: 05/07/2005 Document Revised: 02/26/2016 Document Reviewed: 08/28/2015 °Elsevier Interactive Patient Education © 2017 [employer] Inc. ° ° ° ° °General Anesthesia, Adult, Care After °These instructions provide you with information about caring for yourself after your procedure. Your health care provider may also give you more specific instructions. Your treatment has been planned according to current medical practices, but problems sometimes occur. Call your health care provider if you have any problems or questions after your procedure. °What can I expect after the procedure? °After the procedure, it is common to have: °· Vomiting. °· A sore throat. °· Mental slowness. °It is common to feel: °· Nauseous. °· Cold or shivery. °· Sleepy. °· Tired. °· Sore or achy, even in parts of your body where you did not have surgery. °Follow these instructions at   home: °For at least 24 hours after the procedure:  °· Do not: °¨ Participate in activities where you could fall or become injured. °¨ Drive. °¨ Use heavy machinery. °¨ Drink alcohol. °¨ Take sleeping pills or medicines that cause drowsiness. °¨ Make important decisions or sign legal documents. °¨ Take care of children on your own. °· Rest. °Eating and drinking  °· If you vomit, drink  water, juice, or soup when you can drink without vomiting. °· Drink enough fluid to keep your urine clear or pale yellow. °· Make sure you have little or no nausea before eating solid foods. °· Follow the diet recommended by your health care provider. °General instructions  °· Have a responsible adult stay with you until you are awake and alert. °· Return to your normal activities as told by your health care provider. Ask your health care provider what activities are safe for you. °· Take over-the-counter and prescription medicines only as told by your health care provider. °· If you smoke, do not smoke without supervision. °· Keep all follow-up visits as told by your health care provider. This is important. °Contact a health care provider if: °· You continue to have nausea or vomiting at home, and medicines are not helpful. °· You cannot drink fluids or start eating again. °· You cannot urinate after 8-12 hours. °· You develop a skin rash. °· You have fever. °· You have increasing redness at the site of your procedure. °Get help right away if: °· You have difficulty breathing. °· You have chest pain. °· You have unexpected bleeding. °· You feel that you are having a life-threatening or urgent problem. °This information is not intended to replace advice given to you by your health care provider. Make sure you discuss any questions you have with your health care provider. °Document Released: 01/24/2001 Document Revised: 03/22/2016 Document Reviewed: 10/02/2015 °Elsevier Interactive Patient Education © 2017 Elsevier Inc. ° °

## 2017-01-03 ENCOUNTER — Ambulatory Visit
Admission: RE | Admit: 2017-01-03 | Discharge: 2017-01-03 | Disposition: A | Discharge status: Home / Self Care | Payer: Medicare HMO | Source: Ambulatory Visit | Attending: Ophthalmology | Admitting: Ophthalmology

## 2017-01-03 ENCOUNTER — Encounter
Admission: RE | Disposition: A | Discharge status: Home / Self Care | Payer: Self-pay | Source: Ambulatory Visit | Attending: Ophthalmology

## 2017-01-03 ENCOUNTER — Ambulatory Visit: Payer: Medicare HMO | Admitting: Anesthesiology

## 2017-01-03 DIAGNOSIS — H2513 Age-related nuclear cataract, bilateral: Secondary | ICD-10-CM | POA: Diagnosis not present

## 2017-01-03 DIAGNOSIS — Z79899 Other long term (current) drug therapy: Secondary | ICD-10-CM | POA: Insufficient documentation

## 2017-01-03 DIAGNOSIS — J449 Chronic obstructive pulmonary disease, unspecified: Secondary | ICD-10-CM | POA: Insufficient documentation

## 2017-01-03 DIAGNOSIS — H2511 Age-related nuclear cataract, right eye: Secondary | ICD-10-CM | POA: Diagnosis not present

## 2017-01-03 DIAGNOSIS — F17200 Nicotine dependence, unspecified, uncomplicated: Secondary | ICD-10-CM | POA: Insufficient documentation

## 2017-01-03 HISTORY — DX: Asymptomatic varicose veins of bilateral lower extremities: I83.93

## 2017-01-03 HISTORY — DX: Headache: R51

## 2017-01-03 HISTORY — DX: Chronic obstructive pulmonary disease, unspecified: J44.9

## 2017-01-03 HISTORY — DX: Unspecified osteoarthritis, unspecified site: M19.90

## 2017-01-03 HISTORY — DX: Cough: R05

## 2017-01-03 HISTORY — DX: Chronic cough: R05.3

## 2017-01-03 HISTORY — PX: CATARACT EXTRACTION W/PHACO: SHX586

## 2017-01-03 HISTORY — DX: Headache, unspecified: R51.9

## 2017-01-03 SURGERY — PHACOEMULSIFICATION, CATARACT, WITH IOL INSERTION
Anesthesia: Monitor Anesthesia Care | Site: Eye | Laterality: Right | Wound class: Clean

## 2017-01-03 MED ORDER — ARMC OPHTHALMIC DILATING DROPS
1.0000 "application " | OPHTHALMIC | Status: DC | PRN
  Administered 2017-01-03 (×3): 1 via OPHTHALMIC

## 2017-01-03 MED ORDER — CEFUROXIME OPHTHALMIC INJECTION 1 MG/0.1 ML
INJECTION | OPHTHALMIC | Status: DC | PRN
  Administered 2017-01-03: 0.1 mL via INTRACAMERAL

## 2017-01-03 MED ORDER — LIDOCAINE HCL (PF) 2 % IJ SOLN
INTRAMUSCULAR | Status: DC | PRN
  Administered 2017-01-03: .2 mL via INTRADERMAL

## 2017-01-03 MED ORDER — ARMC OPHTHALMIC DILATING DROPS: 1.0000 "application " | OPHTHALMIC | Status: DC | PRN

## 2017-01-03 MED ORDER — MOXIFLOXACIN HCL 0.5 % OP SOLN
1.0000 [drp] | OPHTHALMIC | Status: DC | PRN
  Administered 2017-01-03 (×3): 1 [drp] via OPHTHALMIC

## 2017-01-03 MED ORDER — MOXIFLOXACIN HCL 0.5 % OP SOLN: 1.0000 [drp] | OPHTHALMIC | Status: DC | PRN

## 2017-01-03 MED ORDER — MIDAZOLAM HCL 2 MG/2ML IJ SOLN
INTRAMUSCULAR | Status: DC | PRN
  Administered 2017-01-03: 2 mg via INTRAVENOUS

## 2017-01-03 MED ORDER — EPINEPHRINE PF 1 MG/ML IJ SOLN
INTRAOCULAR | Status: DC | PRN
  Administered 2017-01-03: 65 mL via OPHTHALMIC

## 2017-01-03 MED ORDER — LACTATED RINGERS IV SOLN: INTRAVENOUS | Status: DC

## 2017-01-03 MED ORDER — NA HYALUR & NA CHOND-NA HYALUR 0.4-0.35 ML IO KIT
PACK | INTRAOCULAR | Status: DC | PRN
  Administered 2017-01-03: 1 mL via INTRAOCULAR

## 2017-01-03 MED ORDER — FENTANYL CITRATE (PF) 100 MCG/2ML IJ SOLN
INTRAMUSCULAR | Status: DC | PRN
  Administered 2017-01-03: 100 ug via INTRAVENOUS

## 2017-01-03 MED ORDER — LIDOCAINE HCL (PF) 2 % IJ SOLN: INTRAOCULAR | Status: DC | PRN

## 2017-01-03 MED ORDER — BRIMONIDINE TARTRATE-TIMOLOL 0.2-0.5 % OP SOLN
OPHTHALMIC | Status: DC | PRN
  Administered 2017-01-03: 1 [drp] via OPHTHALMIC

## 2017-01-03 SURGICAL SUPPLY — 23 items
APPLICATOR COTTON TIP 3IN (MISCELLANEOUS) ×3 IMPLANT
CANNULA ANT/CHMB 27GA (MISCELLANEOUS) ×3 IMPLANT
DISSECTOR HYDRO NUCLEUS 50X22 (MISCELLANEOUS) ×3 IMPLANT
GLOVE BIO SURGEON STRL SZ7 (GLOVE) ×3 IMPLANT
GLOVE SURG LX 6.5 MICRO (GLOVE) ×2
GLOVE SURG LX STRL 6.5 MICRO (GLOVE) ×1 IMPLANT
GOWN STRL REUS W/ TWL LRG LVL3 (GOWN DISPOSABLE) ×2 IMPLANT
GOWN STRL REUS W/TWL LRG LVL3 (GOWN DISPOSABLE) ×4
LENS IOL ACRSF IQ ULTRA 13.5 (Intraocular Lens) ×1 IMPLANT
LENS IOL ACRYSOF IQ 13.5 (Intraocular Lens) ×3 IMPLANT
MARKER SKIN DUAL TIP RULER LAB (MISCELLANEOUS) ×3 IMPLANT
PACK CATARACT BRASINGTON (MISCELLANEOUS) ×3 IMPLANT
PACK EYE AFTER SURG (MISCELLANEOUS) ×3 IMPLANT
PACK OPTHALMIC (MISCELLANEOUS) ×3 IMPLANT
RING MALYGIN 7.0 (MISCELLANEOUS) IMPLANT
SUT ETHILON 10-0 CS-B-6CS-B-6 (SUTURE)
SUT VICRYL  9 0 (SUTURE)
SUT VICRYL 9 0 (SUTURE) IMPLANT
SUTURE EHLN 10-0 CS-B-6CS-B-6 (SUTURE) IMPLANT
SYR TB 1ML LUER SLIP (SYRINGE) ×3 IMPLANT
WATER STERILE IRR 250ML POUR (IV SOLUTION) ×3 IMPLANT
WICK EYE OCUCEL (MISCELLANEOUS) IMPLANT
WIPE NON LINTING 3.25X3.25 (MISCELLANEOUS) ×3 IMPLANT

## 2017-01-03 NOTE — Transfer of Care (Signed)
Immediate Anesthesia Transfer of Care Note  Patient: Morgan Valdez  Procedure(s) Performed: Procedure(s): CATARACT EXTRACTION PHACO AND INTRAOCULAR LENS PLACEMENT (IOC)  Right (Right)  Patient Location: PACU  Anesthesia Type: MAC  Level of Consciousness: awake, alert  and patient cooperative  Airway and Oxygen Therapy: Patient Spontanous Breathing and Patient connected to supplemental oxygen  Post-op Assessment: Post-op Vital signs reviewed, Patient's Cardiovascular Status Stable, Respiratory Function Stable, Patent Airway and No signs of Nausea or vomiting  Post-op Vital Signs: Reviewed and stable  Complications: No apparent anesthesia complications

## 2017-01-03 NOTE — Anesthesia Postprocedure Evaluation (Signed)
Anesthesia Post Note  Patient: Morgan Valdez  Procedure(s) Performed: Procedure(s) (LRB): CATARACT EXTRACTION PHACO AND INTRAOCULAR LENS PLACEMENT (IOC)  Right (Right)  Patient location during evaluation: PACU Anesthesia Type: MAC Level of consciousness: awake Pain management: pain level controlled Vital Signs Assessment: post-procedure vital signs reviewed and stable Respiratory status: spontaneous breathing Cardiovascular status: blood pressure returned to baseline Postop Assessment: no headache Anesthetic complications: no    Jaci Standard, III,  Shadara Lopez D

## 2017-01-03 NOTE — Anesthesia Preprocedure Evaluation (Addendum)
Anesthesia Evaluation  Patient identified by MRN, date of birth, ID band Patient awake    Reviewed: Allergy & Precautions, H&P , NPO status , Patient's Chart, lab work & pertinent test results  Airway Mallampati: II  TM Distance: >3 FB Neck ROM: full    Dental no notable dental hx.    Pulmonary COPD,  COPD inhaler, Current Smoker,    Pulmonary exam normal        Cardiovascular negative cardio ROS Normal cardiovascular exam     Neuro/Psych    GI/Hepatic Neg liver ROS, Medicated,  Endo/Other  negative endocrine ROS  Renal/GU negative Renal ROS     Musculoskeletal   Abdominal   Peds  Hematology negative hematology ROS (+)   Anesthesia Other Findings   Reproductive/Obstetrics                            Anesthesia Physical Anesthesia Plan  ASA: II  Anesthesia Plan: MAC   Post-op Pain Management:    Induction:   Airway Management Planned:   Additional Equipment:   Intra-op Plan:   Post-operative Plan:   Informed Consent: I have reviewed the patients History and Physical, chart, labs and discussed the procedure including the risks, benefits and alternatives for the proposed anesthesia with the patient or authorized representative who has indicated his/her understanding and acceptance.     Plan Discussed with:   Anesthesia Plan Comments:         Anesthesia Quick Evaluation

## 2017-01-03 NOTE — Anesthesia Procedure Notes (Signed)
Procedure Name: MAC Performed by: Mayme Genta Pre-anesthesia Checklist: Patient identified, Emergency Drugs available, Suction available, Timeout performed and Patient being monitored Patient Re-evaluated:Patient Re-evaluated prior to inductionOxygen Delivery Method: Nasal cannula Placement Confirmation: positive ETCO2

## 2017-01-03 NOTE — H&P (Signed)
H+P reviewed and is up to date, please see paper chart.  

## 2017-01-03 NOTE — Op Note (Signed)
Date of Surgery: 01/03/2017  PREOPERATIVE DIAGNOSES: Visually significant nuclear sclerotic cataract, right eye.  POSTOPERATIVE DIAGNOSES: Same  PROCEDURES PERFORMED: Cataract extraction with intraocular lens implant, right eye.  SURGEON: Almon Hercules, M.D.  ANESTHESIA: MAC and topical  IMPLANTS: [id number] +13.5 D  Implant Name Type Inv. Item Serial No. Manufacturer Lot No. LRB No. Used  LENS IOL ACRYSOF IQ 13.5 - [id number] Intraocular Lens LENS IOL ACRYSOF IQ 13.5 [id number] [employer]   Right 1     COMPLICATIONS: None.  DESCRIPTION OF PROCEDURE: Therapeutic options were discussed with the patient preoperatively, including a discussion of risks and benefits of surgery. Informed consent was obtained. An IOL-Master and immersion biometry were used to take the lens measurements, and a dilated fundus exam was performed within 6 months of the surgical date.  The patient was premedicated and brought to the operating room and placed on the operating table in the supine position. After adequate anesthesia, the patient was prepped and draped in the usual sterile ophthalmic fashion. A wire lid speculum was inserted and the microscope was positioned. A Superblade was used to create a paracentesis site at the limbus and a small amount of dilute preservative free lidocaine was instilled into the anterior chamber, followed by dispersive viscoelastic. A clear corneal incision was created temporally using a 2.4 mm keratome blade. Capsulorrhexis was then performed. In situ phacoemulsification was performed.  Cortical material was removed with the irrigation-aspiration unit. Dispersive viscoelastic was instilled to open the capsular bag. A posterior chamber intraocular lens with the specifications above was inserted and positioned. Irrigation-aspiration was used to remove all viscoelastic. Cefuroxime 1cc was instilled into the anterior chamber, and the corneal incision was checked and found to be water tight. The  eyelid speculum was removed.  The operative eye was covered with protective goggles after instilling 1 drop of timolol and brimonidine. The patient tolerated the procedure well. There were no complications.

## 2017-01-04 ENCOUNTER — Encounter: Payer: Self-pay | Admitting: Ophthalmology

## 2017-01-11 DIAGNOSIS — H2512 Age-related nuclear cataract, left eye: Secondary | ICD-10-CM | POA: Diagnosis not present

## 2017-01-12 ENCOUNTER — Encounter: Payer: Self-pay | Admitting: *Deleted

## 2017-01-13 NOTE — Discharge Instructions (Signed)
Cataract Surgery, Care After °Refer to this sheet in the next few weeks. These instructions provide you with information about caring for yourself after your procedure. Your health care provider may also give you more specific instructions. Your treatment has been planned according to current medical practices, but problems sometimes occur. Call your health care provider if you have any problems or questions after your procedure. °What can I expect after the procedure? °After the procedure, it is common to have: °· Itching. °· Discomfort. °· Fluid discharge. °· Sensitivity to light and to touch. °· Bruising. °Follow these instructions at home: °Eye Care  °· Check your eye every day for signs of infection. Watch for: °¨ Redness, swelling, or pain. °¨ Fluid, blood, or pus. °¨ Warmth. °¨ Bad smell. °Activity  °· Avoid strenuous activities, such as playing contact sports, for as long as told by your health care provider. °· Do not drive or operate heavy machinery until your health care provider approves. °· Do not bend or lift heavy objects . Bending increases pressure in the eye. You can walk, climb stairs, and do light household chores. °· Ask your health care provider when you can return to work. If you work in a dusty environment, you may be advised to wear protective eyewear for a period of time. °General instructions  °· Take or apply over-the-counter and prescription medicines only as told by your health care provider. This includes eye drops. °· Do not touch or rub your eyes. °· If you were given a protective shield, wear it as told by your health care provider. If you were not given a protective shield, wear sunglasses as told by your health care provider to protect your eyes. °· Keep the area around your eye clean and dry. Avoid swimming or allowing water to hit you directly in the face while showering until told by your health care provider. Keep soap and shampoo out of your eyes. °· Do not put a contact lens  into the affected eye or eyes until your health care provider approves. °· Keep all follow-up visits as told by your health care provider. This is important. °Contact a health care provider if: ° °· You have increased bruising around your eye. °· You have pain that is not helped with medicine. °· You have a fever. °· You have redness, swelling, or pain in your eye. °· You have fluid, blood, or pus coming from your incision. °· Your vision gets worse. °Get help right away if: °· You have sudden vision loss. °This information is not intended to replace advice given to you by your health care provider. Make sure you discuss any questions you have with your health care provider. °Document Released: 05/07/2005 Document Revised: 02/26/2016 Document Reviewed: 08/28/2015 °Elsevier Interactive Patient Education © 2017 [employer] Inc. ° ° ° ° °General Anesthesia, Adult, Care After °These instructions provide you with information about caring for yourself after your procedure. Your health care provider may also give you more specific instructions. Your treatment has been planned according to current medical practices, but problems sometimes occur. Call your health care provider if you have any problems or questions after your procedure. °What can I expect after the procedure? °After the procedure, it is common to have: °· Vomiting. °· A sore throat. °· Mental slowness. °It is common to feel: °· Nauseous. °· Cold or shivery. °· Sleepy. °· Tired. °· Sore or achy, even in parts of your body where you did not have surgery. °Follow these instructions at   home: °For at least 24 hours after the procedure:  °· Do not: °¨ Participate in activities where you could fall or become injured. °¨ Drive. °¨ Use heavy machinery. °¨ Drink alcohol. °¨ Take sleeping pills or medicines that cause drowsiness. °¨ Make important decisions or sign legal documents. °¨ Take care of children on your own. °· Rest. °Eating and drinking  °· If you vomit, drink  water, juice, or soup when you can drink without vomiting. °· Drink enough fluid to keep your urine clear or pale yellow. °· Make sure you have little or no nausea before eating solid foods. °· Follow the diet recommended by your health care provider. °General instructions  °· Have a responsible adult stay with you until you are awake and alert. °· Return to your normal activities as told by your health care provider. Ask your health care provider what activities are safe for you. °· Take over-the-counter and prescription medicines only as told by your health care provider. °· If you smoke, do not smoke without supervision. °· Keep all follow-up visits as told by your health care provider. This is important. °Contact a health care provider if: °· You continue to have nausea or vomiting at home, and medicines are not helpful. °· You cannot drink fluids or start eating again. °· You cannot urinate after 8-12 hours. °· You develop a skin rash. °· You have fever. °· You have increasing redness at the site of your procedure. °Get help right away if: °· You have difficulty breathing. °· You have chest pain. °· You have unexpected bleeding. °· You feel that you are having a life-threatening or urgent problem. °This information is not intended to replace advice given to you by your health care provider. Make sure you discuss any questions you have with your health care provider. °Document Released: 01/24/2001 Document Revised: 03/22/2016 Document Reviewed: 10/02/2015 °Elsevier Interactive Patient Education © 2017 Elsevier Inc. ° °

## 2017-01-17 ENCOUNTER — Ambulatory Visit: Payer: Medicare HMO | Admitting: Anesthesiology

## 2017-01-17 ENCOUNTER — Encounter
Admission: RE | Disposition: A | Discharge status: Home / Self Care | Payer: Self-pay | Source: Ambulatory Visit | Attending: Ophthalmology

## 2017-01-17 ENCOUNTER — Ambulatory Visit
Admission: RE | Admit: 2017-01-17 | Discharge: 2017-01-17 | Disposition: A | Discharge status: Home / Self Care | Payer: Medicare HMO | Source: Ambulatory Visit | Attending: Ophthalmology | Admitting: Ophthalmology

## 2017-01-17 DIAGNOSIS — M1990 Unspecified osteoarthritis, unspecified site: Secondary | ICD-10-CM | POA: Insufficient documentation

## 2017-01-17 DIAGNOSIS — H2512 Age-related nuclear cataract, left eye: Secondary | ICD-10-CM | POA: Diagnosis not present

## 2017-01-17 DIAGNOSIS — F17200 Nicotine dependence, unspecified, uncomplicated: Secondary | ICD-10-CM | POA: Diagnosis not present

## 2017-01-17 DIAGNOSIS — K219 Gastro-esophageal reflux disease without esophagitis: Secondary | ICD-10-CM | POA: Diagnosis not present

## 2017-01-17 DIAGNOSIS — F329 Major depressive disorder, single episode, unspecified: Secondary | ICD-10-CM | POA: Insufficient documentation

## 2017-01-17 DIAGNOSIS — R51 Headache: Secondary | ICD-10-CM | POA: Insufficient documentation

## 2017-01-17 DIAGNOSIS — J449 Chronic obstructive pulmonary disease, unspecified: Secondary | ICD-10-CM | POA: Insufficient documentation

## 2017-01-17 DIAGNOSIS — Z79899 Other long term (current) drug therapy: Secondary | ICD-10-CM | POA: Insufficient documentation

## 2017-01-17 HISTORY — PX: CATARACT EXTRACTION W/PHACO: SHX586

## 2017-01-17 SURGERY — PHACOEMULSIFICATION, CATARACT, WITH IOL INSERTION
Anesthesia: Monitor Anesthesia Care | Site: Eye | Laterality: Left

## 2017-01-17 MED ORDER — BRIMONIDINE TARTRATE-TIMOLOL 0.2-0.5 % OP SOLN
OPHTHALMIC | Status: DC | PRN
  Administered 2017-01-17: 1 [drp] via OPHTHALMIC

## 2017-01-17 MED ORDER — NA HYALUR & NA CHOND-NA HYALUR 0.4-0.35 ML IO KIT
PACK | INTRAOCULAR | Status: DC | PRN
  Administered 2017-01-17: 1 mL via INTRAOCULAR

## 2017-01-17 MED ORDER — MIDAZOLAM HCL 2 MG/2ML IJ SOLN
INTRAMUSCULAR | Status: DC | PRN
  Administered 2017-01-17: 2 mg via INTRAVENOUS

## 2017-01-17 MED ORDER — MOXIFLOXACIN HCL 0.5 % OP SOLN
1.0000 [drp] | OPHTHALMIC | Status: DC | PRN
  Administered 2017-01-17 (×3): 1 [drp] via OPHTHALMIC

## 2017-01-17 MED ORDER — LIDOCAINE HCL (PF) 2 % IJ SOLN
INTRAMUSCULAR | Status: DC | PRN
  Administered 2017-01-17: 1 mL

## 2017-01-17 MED ORDER — ARMC OPHTHALMIC DILATING DROPS
1.0000 "application " | OPHTHALMIC | Status: DC | PRN
  Administered 2017-01-17 (×3): 1 via OPHTHALMIC

## 2017-01-17 MED ORDER — FENTANYL CITRATE (PF) 100 MCG/2ML IJ SOLN
INTRAMUSCULAR | Status: DC | PRN
  Administered 2017-01-17: 100 ug via INTRAVENOUS

## 2017-01-17 MED ORDER — CEFUROXIME OPHTHALMIC INJECTION 1 MG/0.1 ML
INJECTION | OPHTHALMIC | Status: DC | PRN
  Administered 2017-01-17: .2 mL via OPHTHALMIC

## 2017-01-17 MED ORDER — EPINEPHRINE PF 1 MG/ML IJ SOLN
INTRAMUSCULAR | Status: DC | PRN
  Administered 2017-01-17: 110 mL via OPHTHALMIC

## 2017-01-17 SURGICAL SUPPLY — 23 items
APPLICATOR COTTON TIP 3IN (MISCELLANEOUS) ×2 IMPLANT
CANNULA ANT/CHMB 27GA (MISCELLANEOUS) ×2 IMPLANT
DISSECTOR HYDRO NUCLEUS 50X22 (MISCELLANEOUS) ×2 IMPLANT
GLOVE BIO SURGEON STRL SZ7 (GLOVE) ×2 IMPLANT
GLOVE SURG LX 6.5 MICRO (GLOVE) ×1
GLOVE SURG LX STRL 6.5 MICRO (GLOVE) ×1 IMPLANT
GOWN STRL REUS W/ TWL LRG LVL3 (GOWN DISPOSABLE) ×2 IMPLANT
GOWN STRL REUS W/TWL LRG LVL3 (GOWN DISPOSABLE) ×2
LENS IOL ACRSF IQ ULTRA 13.5 (Intraocular Lens) ×1 IMPLANT
LENS IOL ACRYSOF IQ 13.5 (Intraocular Lens) ×2 IMPLANT
MARKER SKIN DUAL TIP RULER LAB (MISCELLANEOUS) ×2 IMPLANT
PACK CATARACT BRASINGTON (MISCELLANEOUS) ×2 IMPLANT
PACK EYE AFTER SURG (MISCELLANEOUS) ×2 IMPLANT
PACK OPTHALMIC (MISCELLANEOUS) ×2 IMPLANT
RING MALYGIN 7.0 (MISCELLANEOUS) IMPLANT
SUT ETHILON 10-0 CS-B-6CS-B-6 (SUTURE)
SUT VICRYL  9 0 (SUTURE)
SUT VICRYL 9 0 (SUTURE) IMPLANT
SUTURE EHLN 10-0 CS-B-6CS-B-6 (SUTURE) IMPLANT
SYR TB 1ML LUER SLIP (SYRINGE) ×2 IMPLANT
WATER STERILE IRR 250ML POUR (IV SOLUTION) ×2 IMPLANT
WICK EYE OCUCEL (MISCELLANEOUS) IMPLANT
WIPE NON LINTING 3.25X3.25 (MISCELLANEOUS) ×2 IMPLANT

## 2017-01-17 NOTE — Anesthesia Preprocedure Evaluation (Addendum)
Anesthesia Evaluation  Patient identified by MRN, date of birth, ID band Patient awake    Reviewed: Allergy & Precautions, H&P , NPO status , Patient's Chart, lab work & pertinent test results  Airway Mallampati: II  TM Distance: >3 FB Neck ROM: full    Dental no notable dental hx.    Pulmonary COPD,  COPD inhaler, Current Smoker,    Pulmonary exam normal        Cardiovascular negative cardio ROS Normal cardiovascular exam     Neuro/Psych  Headaches, PSYCHIATRIC DISORDERS Depression    GI/Hepatic Neg liver ROS, GERD  Medicated and Controlled,  Endo/Other  negative endocrine ROS  Renal/GU negative Renal ROS     Musculoskeletal  (+) Arthritis , Osteoarthritis,    Abdominal   Peds  Hematology negative hematology ROS (+)   Anesthesia Other Findings   Reproductive/Obstetrics                            Anesthesia Physical Anesthesia Plan  ASA: II  Anesthesia Plan: MAC   Post-op Pain Management:    Induction: Intravenous  Airway Management Planned:   Additional Equipment:   Intra-op Plan:   Post-operative Plan:   Informed Consent: I have reviewed the patients History and Physical, chart, labs and discussed the procedure including the risks, benefits and alternatives for the proposed anesthesia with the patient or authorized representative who has indicated his/her understanding and acceptance.     Plan Discussed with: CRNA  Anesthesia Plan Comments:         Anesthesia Quick Evaluation                                   Anesthesia Evaluation  Patient identified by MRN, date of birth, ID band Patient awake    Reviewed: Allergy & Precautions, H&P , NPO status , Patient's Chart, lab work & pertinent test results  Airway Mallampati: II  TM Distance: >3 FB Neck ROM: full    Dental no notable dental hx.    Pulmonary COPD,  COPD inhaler, Current Smoker,     Pulmonary exam normal        Cardiovascular negative cardio ROS Normal cardiovascular exam     Neuro/Psych    GI/Hepatic Neg liver ROS, Medicated,  Endo/Other  negative endocrine ROS  Renal/GU negative Renal ROS     Musculoskeletal   Abdominal   Peds  Hematology negative hematology ROS (+)   Anesthesia Other Findings   Reproductive/Obstetrics                            Anesthesia Physical Anesthesia Plan  ASA: II  Anesthesia Plan: MAC   Post-op Pain Management:    Induction:   Airway Management Planned:   Additional Equipment:   Intra-op Plan:   Post-operative Plan:   Informed Consent: I have reviewed the patients History and Physical, chart, labs and discussed the procedure including the risks, benefits and alternatives for the proposed anesthesia with the patient or authorized representative who has indicated his/her understanding and acceptance.     Plan Discussed with:   Anesthesia Plan Comments:         Anesthesia Quick Evaluation

## 2017-01-17 NOTE — Transfer of Care (Signed)
Immediate Anesthesia Transfer of Care Note  Patient: Morgan Valdez  Procedure(s) Performed: Procedure(s) with comments: CATARACT EXTRACTION PHACO AND INTRAOCULAR LENS PLACEMENT (IOC) (Left) - left  Patient Location: PACU  Anesthesia Type: MAC  Level of Consciousness: awake, alert  and patient cooperative  Airway and Oxygen Therapy: Patient Spontanous Breathing and Patient connected to supplemental oxygen  Post-op Assessment: Post-op Vital signs reviewed, Patient's Cardiovascular Status Stable, Respiratory Function Stable, Patent Airway and No signs of Nausea or vomiting  Post-op Vital Signs: Reviewed and stable  Complications: No apparent anesthesia complications

## 2017-01-17 NOTE — Op Note (Signed)
Date of Surgery: 01/17/2017  PREOPERATIVE DIAGNOSES: Visually significant nuclear sclerotic cataract, left eye.  POSTOPERATIVE DIAGNOSES: Same  PROCEDURES PERFORMED: Cataract extraction with intraocular lens implant, left eye.  SURGEON: Almon Hercules, M.D.  ANESTHESIA: MAC and topical  IMPLANTS: [id number] +13.5 D  Implant Name Type Inv. Item Serial No. Manufacturer Lot No. LRB No. Used  LENS IOL ACRYSOF IQ 13.5 - [id number] Intraocular Lens LENS IOL ACRYSOF IQ 13.5 [id number] ALCON   Left 1    COMPLICATIONS: None.  DESCRIPTION OF PROCEDURE: Therapeutic options were discussed with the patient preoperatively, including a discussion of risks and benefits of surgery. Informed consent was obtained. An IOL-Master and immersion biometry were used to take the lens measurements, and a dilated fundus exam was performed within 6 months of the surgical date.  The patient was premedicated and brought to the operating room and placed on the operating table in the supine position. After adequate anesthesia, the patient was prepped and draped in the usual sterile ophthalmic fashion. A wire lid speculum was inserted and the microscope was positioned. A Superblade was used to create a paracentesis site at the limbus and a small amount of dilute preservative free lidocaine was instilled into the anterior chamber, followed by dispersive viscoelastic. A clear corneal incision was created temporally using a 2.4 mm keratome blade. Capsulorrhexis was then performed. In situ phacoemulsification was performed.  Cortical material was removed with the irrigation-aspiration unit. Dispersive viscoelastic was instilled to open the capsular bag. A posterior chamber intraocular lens with the specifications above was inserted and positioned. Irrigation-aspiration was used to remove all viscoelastic. Cefuroxime 1cc was instilled into the anterior chamber, and the corneal incision was checked and found to be water tight. The  eyelid speculum was removed.  The operative eye was covered with protective goggles after instilling 1 drop of timolol and brimonidine. The patient tolerated the procedure well. There were no complications.

## 2017-01-17 NOTE — H&P (Signed)
H+P reviewed and is up to date, please see paper chart.  

## 2017-01-17 NOTE — Anesthesia Postprocedure Evaluation (Signed)
Anesthesia Post Note  Patient: Morgan Valdez  Procedure(s) Performed: Procedure(s) (LRB): CATARACT EXTRACTION PHACO AND INTRAOCULAR LENS PLACEMENT (IOC) (Left)  Patient location during evaluation: PACU Anesthesia Type: MAC Level of consciousness: awake and alert and oriented Pain management: pain level controlled Vital Signs Assessment: post-procedure vital signs reviewed and stable Respiratory status: spontaneous breathing and nonlabored ventilation Cardiovascular status: stable Postop Assessment: no signs of nausea or vomiting and adequate PO intake Anesthetic complications: no    Estill Batten

## 2017-01-17 NOTE — Anesthesia Procedure Notes (Signed)
Procedure Name: MAC Performed by: Mayme Genta Pre-anesthesia Checklist: Patient identified, Emergency Drugs available, Suction available, Timeout performed and Patient being monitored Patient Re-evaluated:Patient Re-evaluated prior to inductionOxygen Delivery Method: Nasal cannula Placement Confirmation: positive ETCO2

## 2017-01-18 ENCOUNTER — Encounter: Payer: Self-pay | Admitting: Ophthalmology

## 2017-04-12 DIAGNOSIS — K219 Gastro-esophageal reflux disease without esophagitis: Secondary | ICD-10-CM | POA: Diagnosis not present

## 2017-04-12 DIAGNOSIS — G2581 Restless legs syndrome: Secondary | ICD-10-CM | POA: Diagnosis not present

## 2017-04-12 DIAGNOSIS — F17210 Nicotine dependence, cigarettes, uncomplicated: Secondary | ICD-10-CM | POA: Diagnosis not present

## 2017-04-12 DIAGNOSIS — F329 Major depressive disorder, single episode, unspecified: Secondary | ICD-10-CM | POA: Diagnosis not present

## 2017-04-12 DIAGNOSIS — J449 Chronic obstructive pulmonary disease, unspecified: Secondary | ICD-10-CM | POA: Diagnosis not present

## 2017-04-12 DIAGNOSIS — Z79899 Other long term (current) drug therapy: Secondary | ICD-10-CM | POA: Diagnosis not present

## 2017-04-12 DIAGNOSIS — J069 Acute upper respiratory infection, unspecified: Secondary | ICD-10-CM | POA: Diagnosis not present

## 2017-05-10 DIAGNOSIS — Z0000 Encounter for general adult medical examination without abnormal findings: Secondary | ICD-10-CM | POA: Diagnosis not present

## 2017-05-10 DIAGNOSIS — Z720 Tobacco use: Secondary | ICD-10-CM | POA: Diagnosis not present

## 2017-05-10 DIAGNOSIS — F3289 Other specified depressive episodes: Secondary | ICD-10-CM | POA: Diagnosis not present

## 2017-05-10 DIAGNOSIS — G2581 Restless legs syndrome: Secondary | ICD-10-CM | POA: Diagnosis not present

## 2017-05-10 DIAGNOSIS — M7061 Trochanteric bursitis, right hip: Secondary | ICD-10-CM | POA: Diagnosis not present

## 2017-05-12 ENCOUNTER — Emergency Department
Admission: EM | Admit: 2017-05-12 | Discharge: 2017-05-12 | Disposition: A | Discharge status: Home / Self Care | Payer: Medicare HMO | Attending: Emergency Medicine | Admitting: Emergency Medicine

## 2017-05-12 ENCOUNTER — Encounter: Payer: Self-pay | Admitting: Emergency Medicine

## 2017-05-12 DIAGNOSIS — Z79899 Other long term (current) drug therapy: Secondary | ICD-10-CM | POA: Insufficient documentation

## 2017-05-12 DIAGNOSIS — J449 Chronic obstructive pulmonary disease, unspecified: Secondary | ICD-10-CM | POA: Insufficient documentation

## 2017-05-12 DIAGNOSIS — G2581 Restless legs syndrome: Secondary | ICD-10-CM | POA: Diagnosis not present

## 2017-05-12 DIAGNOSIS — Z7951 Long term (current) use of inhaled steroids: Secondary | ICD-10-CM | POA: Diagnosis not present

## 2017-05-12 DIAGNOSIS — G894 Chronic pain syndrome: Secondary | ICD-10-CM | POA: Insufficient documentation

## 2017-05-12 DIAGNOSIS — F17210 Nicotine dependence, cigarettes, uncomplicated: Secondary | ICD-10-CM | POA: Insufficient documentation

## 2017-05-12 DIAGNOSIS — M79606 Pain in leg, unspecified: Secondary | ICD-10-CM | POA: Diagnosis present

## 2017-05-12 MED ORDER — ORPHENADRINE CITRATE ER 100 MG PO TB12: 100.0000 mg | ORAL_TABLET | Freq: Two times a day (BID) | ORAL | 0 refills | Status: DC

## 2017-05-12 MED ORDER — ORPHENADRINE CITRATE 30 MG/ML IJ SOLN
60.0000 mg | Freq: Two times a day (BID) | INTRAMUSCULAR | Status: DC
  Administered 2017-05-12: 60 mg via INTRAMUSCULAR
  Filled 2017-05-12: qty 2

## 2017-05-12 NOTE — ED Triage Notes (Signed)
Patient presents to ED via POV from home with chronic leg pain. Hx of restless leg. Patient states she took her pain medication at home but needs something stronger. Patient ambulatory to triage. Even and non labored respirations noted.

## 2017-05-12 NOTE — ED Provider Notes (Signed)
[facility] Emergency Department Provider Note   ____________________________________________   None    (approximate)  I have reviewed the triage vital signs and the nursing notes.   HISTORY  Chief Complaint Leg Pain    HPI Morgan Valdez is a 71 y.o. female patient here today for stronger pain medication for chronic leg pain. Patient states she was diagnosed with restless leg syndrome. Patient stated current medicationsare not helping. Patient states she discussed this with Dr. Marcelino Scot today and was told to come to the ED for MRI. Patient is also requesting stronger pain medication. Patient had a prescription filled 2 days ago for tramadol. Patient stated pain increases at night. Patient rates the pain as a 7/10. Patient described the pain as "achy". Patient state his chronic back pain but denies to bladder or bowel dysfunction. Patient denies numbness to the lower extremities.   Past Medical History:  Diagnosis Date  . Allergic state   . Arthritis   . Barrett's esophagus   . Chronic cough   . COPD (chronic obstructive pulmonary disease) ([place])   . Depression   . GERD (gastroesophageal reflux disease)   . Headache    migraines - 3-4x/yr  . Varicose veins of legs     There are no active problems to display for this patient.   Past Surgical History:  Procedure Laterality Date  . APPENDECTOMY    . CATARACT EXTRACTION W/PHACO Right 01/03/2017   Procedure: CATARACT EXTRACTION PHACO AND INTRAOCULAR LENS PLACEMENT ([place])  Right;  Surgeon: Ronnell Freshwater, MD;  Location: [place];  Service: Ophthalmology;  Laterality: Right;  . CATARACT EXTRACTION W/PHACO Left 01/17/2017   Procedure: CATARACT EXTRACTION PHACO AND INTRAOCULAR LENS PLACEMENT (IOC);  Surgeon: Ronnell Freshwater, MD;  Location: [place];  Service: Ophthalmology;  Laterality: Left;  left  . CHOLECYSTECTOMY    . ESOPHAGOGASTRODUODENOSCOPY (EGD) WITH PROPOFOL  N/A 10/14/2015   Procedure: ESOPHAGOGASTRODUODENOSCOPY (EGD) WITH PROPOFOL;  Surgeon: Lollie Sails, MD;  Location: [facility] ENDOSCOPY;  Service: Endoscopy;  Laterality: N/A;  . HERNIA REPAIR      Prior to Admission medications   Medication Sig Start Date End Date Taking? Authorizing Provider  albuterol (PROVENTIL HFA;VENTOLIN HFA) 108 (90 Base) MCG/ACT inhaler Inhale 2 puffs into the lungs every 6 (six) hours as needed for wheezing or shortness of breath. 05/04/16   Schuyler Amor, MD  Budesonide-Formoterol Fumarate (SYMBICORT IN) Inhale into the lungs 2 (two) times daily as needed.    [provider]  cetirizine (ZYRTEC) 10 MG tablet Take 10 mg by mouth daily.    [provider]  fluticasone (FLONASE) 50 MCG/ACT nasal spray Place 2 sprays into both nostrils daily.    [provider]  furosemide (LASIX) 20 MG tablet Take 20 mg by mouth daily.    [provider]  montelukast (SINGULAIR) 10 MG tablet Take 10 mg by mouth at bedtime.    [provider]  omeprazole (PRILOSEC) 40 MG capsule Take 40 mg by mouth daily.    [provider]  orphenadrine (NORFLEX) 100 MG tablet Take 1 tablet (100 mg total) by mouth 2 (two) times daily. 05/12/17   Sable Feil, PA-C  pramipexole (MIRAPEX) 0.5 MG tablet Take 0.5 mg by mouth 3 (three) times daily.    [provider]  sertraline (ZOLOFT) 50 MG tablet Take 50 mg by mouth 2 (two) times daily. 50 mg AM, 25 mg PM    [provider]  tiZANidine (ZANAFLEX) 2 MG tablet Take 2 mg by mouth as needed for muscle spasms.    [provider]    Allergies Bacitracin  No family history on file.  Social History Social History  Substance Use Topics  . Smoking status: Current Every Day Smoker    Packs/day: 0.50    Years: 50.00  . Smokeless tobacco: Never Used  . Alcohol use Yes     Comment: rare - Holidays    Review of Systems  Constitutional: No fever/chills Eyes: No visual  changes. ENT: No sore throat. Cardiovascular: Denies chest pain. Respiratory: Denies shortness of breath. Gastrointestinal: No abdominal pain.  No nausea, no vomiting.  No diarrhea.  No constipation. Genitourinary: Negative for dysuria. Musculoskeletal: Bilateral pain lower extremities Skin: Negative for rash. Neurological: Negative for headaches, focal weakness or numbness. Psychiatric:Depression  Allergic/Immunilogical: Bacitracin ____________________________________________   PHYSICAL EXAM:  VITAL SIGNS: ED Triage Vitals  Enc Vitals Group     BP 05/12/17 1307 101/78     Pulse Rate 05/12/17 1307 99     Resp 05/12/17 1307 17     Temp 05/12/17 1307 98.6 F (37 C)     Temp src --      SpO2 05/12/17 1307 94 %     Weight 05/12/17 1308 172 lb (78 kg)     Height 05/12/17 1308 5\' 7"  (1.702 m)     Head Circumference --      Peak Flow --      Pain Score 05/12/17 1307 8     Pain Loc --      Pain Edu? --      Excl. in [place]? --     Constitutional: Alert and oriented. Well appearing and in no acute distress. Neck: No stridor.  No cervical spine tenderness to palpation. Hematological/Lymphatic/Immunilogical: No cervical lymphadenopathy. Cardiovascular: Normal rate, regular rhythm. Grossly normal heart sounds.  Good peripheral circulation. Respiratory: Normal respiratory effort.  No retractions. Lungs CTAB. Gastrointestinal: Soft and nontender. No distention. No abdominal bruits. No CVA tenderness. Musculoskeletal: No obvious spinal deformity. Patient has moderate guarding palpation of L4-S1. Patient Straight-leg test.  Neurologic:  Normal speech and language. No gross focal neurologic deficits are appreciated. No gait instability. Skin:  Skin is warm, dry and intact. No rash noted. Psychiatric: Mood and affect are normal. Speech and behavior are normal.  ____________________________________________   LABS (all labs ordered are listed, but only abnormal results are  displayed)  Labs Reviewed - No data to display ____________________________________________  EKG   ____________________________________________  RADIOLOGY  No results found.  ____________________________________________   PROCEDURES  Procedure(s) performed: None  Procedures  Critical Care performed: No  ____________________________________________   INITIAL IMPRESSION / ASSESSMENT AND PLAN / ED COURSE  Pertinent labs & imaging results that were available during my care of the patient were reviewed by me and considered in my medical decision making (see chart for details).  Chronic back pain and restless leg syndrome. Patient given discharge care instructions. Patient given Norflex IM and a prescription for oral medication for 5 days. Patient advised to reschedule  with Dr. Ginette Pitman to discuss consult for MRI of the treatment options.      ____________________________________________   FINAL CLINICAL IMPRESSION(S) / ED DIAGNOSES  Final diagnoses:  Restless leg syndrome      NEW MEDICATIONS STARTED DURING THIS VISIT:  New Prescriptions   ORPHENADRINE (NORFLEX) 100 MG TABLET    Take 1 tablet (100 mg total) by mouth 2 (two) times daily.  Note:  This document was prepared using Dragon voice recognition software and may include unintentional dictation errors.    Sable Feil, PA-C 05/12/17 1507    Harvest Dark, MD 05/12/17 [phone]

## 2017-06-21 DIAGNOSIS — L03115 Cellulitis of right lower limb: Secondary | ICD-10-CM | POA: Diagnosis not present

## 2017-06-24 ENCOUNTER — Ambulatory Visit
Admission: RE | Admit: 2017-06-24 | Discharge: 2017-06-24 | Disposition: A | Discharge status: Home / Self Care | Payer: Medicare HMO | Source: Ambulatory Visit | Attending: Family Medicine | Admitting: Family Medicine

## 2017-06-24 ENCOUNTER — Other Ambulatory Visit: Payer: Self-pay | Admitting: Family Medicine

## 2017-06-24 DIAGNOSIS — I82811 Embolism and thrombosis of superficial veins of right lower extremities: Secondary | ICD-10-CM | POA: Insufficient documentation

## 2017-06-24 DIAGNOSIS — L538 Other specified erythematous conditions: Secondary | ICD-10-CM | POA: Insufficient documentation

## 2017-06-24 DIAGNOSIS — M7989 Other specified soft tissue disorders: Secondary | ICD-10-CM | POA: Insufficient documentation

## 2017-09-06 DIAGNOSIS — F3289 Other specified depressive episodes: Secondary | ICD-10-CM | POA: Diagnosis not present

## 2017-09-06 DIAGNOSIS — Z0000 Encounter for general adult medical examination without abnormal findings: Secondary | ICD-10-CM | POA: Diagnosis not present

## 2017-09-06 DIAGNOSIS — G2581 Restless legs syndrome: Secondary | ICD-10-CM | POA: Diagnosis not present

## 2017-09-06 DIAGNOSIS — Z720 Tobacco use: Secondary | ICD-10-CM | POA: Diagnosis not present

## 2017-09-06 DIAGNOSIS — M7061 Trochanteric bursitis, right hip: Secondary | ICD-10-CM | POA: Diagnosis not present

## 2017-09-13 DIAGNOSIS — J302 Other seasonal allergic rhinitis: Secondary | ICD-10-CM | POA: Diagnosis not present

## 2017-09-13 DIAGNOSIS — G2581 Restless legs syndrome: Secondary | ICD-10-CM | POA: Diagnosis not present

## 2017-09-13 DIAGNOSIS — M5441 Lumbago with sciatica, right side: Secondary | ICD-10-CM | POA: Diagnosis not present

## 2017-09-13 DIAGNOSIS — M25551 Pain in right hip: Secondary | ICD-10-CM | POA: Diagnosis not present

## 2017-09-13 DIAGNOSIS — G8929 Other chronic pain: Secondary | ICD-10-CM | POA: Diagnosis not present

## 2017-09-13 DIAGNOSIS — Z720 Tobacco use: Secondary | ICD-10-CM | POA: Diagnosis not present

## 2017-09-13 DIAGNOSIS — F3289 Other specified depressive episodes: Secondary | ICD-10-CM | POA: Diagnosis not present

## 2017-09-13 DIAGNOSIS — M48062 Spinal stenosis, lumbar region with neurogenic claudication: Secondary | ICD-10-CM | POA: Diagnosis not present

## 2017-09-14 ENCOUNTER — Other Ambulatory Visit: Payer: Self-pay | Admitting: Internal Medicine

## 2017-09-14 DIAGNOSIS — Z720 Tobacco use: Secondary | ICD-10-CM

## 2017-09-14 DIAGNOSIS — G8929 Other chronic pain: Secondary | ICD-10-CM

## 2017-09-14 DIAGNOSIS — G9519 Other vascular myelopathies: Secondary | ICD-10-CM

## 2017-09-14 DIAGNOSIS — M48062 Spinal stenosis, lumbar region with neurogenic claudication: Secondary | ICD-10-CM

## 2017-09-14 DIAGNOSIS — R29818 Other symptoms and signs involving the nervous system: Secondary | ICD-10-CM

## 2017-09-14 DIAGNOSIS — M5441 Lumbago with sciatica, right side: Principal | ICD-10-CM

## 2017-09-24 ENCOUNTER — Ambulatory Visit
Admission: RE | Admit: 2017-09-24 | Discharge: 2017-09-24 | Disposition: A | Discharge status: Home / Self Care | Payer: Medicare HMO | Source: Ambulatory Visit | Attending: Internal Medicine | Admitting: Internal Medicine

## 2017-09-24 DIAGNOSIS — Z720 Tobacco use: Secondary | ICD-10-CM | POA: Diagnosis not present

## 2017-09-24 DIAGNOSIS — M5441 Lumbago with sciatica, right side: Secondary | ICD-10-CM | POA: Insufficient documentation

## 2017-09-24 DIAGNOSIS — G8929 Other chronic pain: Secondary | ICD-10-CM | POA: Diagnosis not present

## 2017-09-24 DIAGNOSIS — M48062 Spinal stenosis, lumbar region with neurogenic claudication: Secondary | ICD-10-CM | POA: Diagnosis not present

## 2017-09-24 DIAGNOSIS — G9519 Other vascular myelopathies: Secondary | ICD-10-CM

## 2017-09-24 DIAGNOSIS — M5136 Other intervertebral disc degeneration, lumbar region: Secondary | ICD-10-CM | POA: Insufficient documentation

## 2017-09-24 DIAGNOSIS — M48061 Spinal stenosis, lumbar region without neurogenic claudication: Secondary | ICD-10-CM | POA: Insufficient documentation

## 2017-09-24 MED ORDER — GADOBENATE DIMEGLUMINE 529 MG/ML IV SOLN
16.0000 mL | Freq: Once | INTRAVENOUS | Status: AC | PRN
  Administered 2017-09-24: 16 mL via INTRAVENOUS

## 2017-10-03 DIAGNOSIS — M5416 Radiculopathy, lumbar region: Secondary | ICD-10-CM | POA: Diagnosis not present

## 2017-10-03 DIAGNOSIS — G8929 Other chronic pain: Secondary | ICD-10-CM | POA: Diagnosis not present

## 2017-10-03 DIAGNOSIS — M5136 Other intervertebral disc degeneration, lumbar region: Secondary | ICD-10-CM | POA: Diagnosis not present

## 2017-10-03 DIAGNOSIS — M545 Low back pain: Secondary | ICD-10-CM | POA: Diagnosis not present

## 2017-10-12 DIAGNOSIS — M6281 Muscle weakness (generalized): Secondary | ICD-10-CM | POA: Diagnosis not present

## 2017-10-12 DIAGNOSIS — M2560 Stiffness of unspecified joint, not elsewhere classified: Secondary | ICD-10-CM | POA: Diagnosis not present

## 2017-10-12 DIAGNOSIS — M5136 Other intervertebral disc degeneration, lumbar region: Secondary | ICD-10-CM | POA: Diagnosis not present

## 2017-10-12 DIAGNOSIS — M5441 Lumbago with sciatica, right side: Secondary | ICD-10-CM | POA: Diagnosis not present

## 2017-10-28 DIAGNOSIS — M5136 Other intervertebral disc degeneration, lumbar region: Secondary | ICD-10-CM | POA: Diagnosis not present

## 2017-11-01 HISTORY — PX: EYE SURGERY: SHX253

## 2017-11-10 DIAGNOSIS — Z720 Tobacco use: Secondary | ICD-10-CM | POA: Diagnosis not present

## 2017-11-10 DIAGNOSIS — M48062 Spinal stenosis, lumbar region with neurogenic claudication: Secondary | ICD-10-CM | POA: Diagnosis not present

## 2017-11-10 DIAGNOSIS — J019 Acute sinusitis, unspecified: Secondary | ICD-10-CM | POA: Diagnosis not present

## 2017-11-10 DIAGNOSIS — G2581 Restless legs syndrome: Secondary | ICD-10-CM | POA: Diagnosis not present

## 2017-11-10 DIAGNOSIS — J309 Allergic rhinitis, unspecified: Secondary | ICD-10-CM | POA: Diagnosis not present

## 2017-11-18 DIAGNOSIS — F3289 Other specified depressive episodes: Secondary | ICD-10-CM | POA: Diagnosis not present

## 2017-11-18 DIAGNOSIS — Z720 Tobacco use: Secondary | ICD-10-CM | POA: Diagnosis not present

## 2017-11-18 DIAGNOSIS — R1032 Left lower quadrant pain: Secondary | ICD-10-CM | POA: Diagnosis not present

## 2017-11-18 DIAGNOSIS — R1031 Right lower quadrant pain: Secondary | ICD-10-CM | POA: Diagnosis not present

## 2018-02-15 DIAGNOSIS — Z86010 Personal history of colonic polyps: Secondary | ICD-10-CM | POA: Diagnosis not present

## 2018-03-23 DIAGNOSIS — J309 Allergic rhinitis, unspecified: Secondary | ICD-10-CM | POA: Diagnosis not present

## 2018-03-23 DIAGNOSIS — F3289 Other specified depressive episodes: Secondary | ICD-10-CM | POA: Diagnosis not present

## 2018-03-23 DIAGNOSIS — G8929 Other chronic pain: Secondary | ICD-10-CM | POA: Diagnosis not present

## 2018-03-23 DIAGNOSIS — E785 Hyperlipidemia, unspecified: Secondary | ICD-10-CM | POA: Diagnosis not present

## 2018-03-23 DIAGNOSIS — R7309 Other abnormal glucose: Secondary | ICD-10-CM | POA: Diagnosis not present

## 2018-03-23 DIAGNOSIS — M25551 Pain in right hip: Secondary | ICD-10-CM | POA: Diagnosis not present

## 2018-03-23 DIAGNOSIS — M5441 Lumbago with sciatica, right side: Secondary | ICD-10-CM | POA: Diagnosis not present

## 2018-03-23 DIAGNOSIS — Z720 Tobacco use: Secondary | ICD-10-CM | POA: Diagnosis not present

## 2018-03-23 DIAGNOSIS — G2581 Restless legs syndrome: Secondary | ICD-10-CM | POA: Diagnosis not present

## 2018-03-29 DIAGNOSIS — F3289 Other specified depressive episodes: Secondary | ICD-10-CM | POA: Diagnosis not present

## 2018-03-29 DIAGNOSIS — R739 Hyperglycemia, unspecified: Secondary | ICD-10-CM | POA: Diagnosis not present

## 2018-03-29 DIAGNOSIS — Z0000 Encounter for general adult medical examination without abnormal findings: Secondary | ICD-10-CM | POA: Diagnosis not present

## 2018-03-29 DIAGNOSIS — Z720 Tobacco use: Secondary | ICD-10-CM | POA: Diagnosis not present

## 2018-03-29 DIAGNOSIS — G2581 Restless legs syndrome: Secondary | ICD-10-CM | POA: Diagnosis not present

## 2018-03-29 DIAGNOSIS — Z1231 Encounter for screening mammogram for malignant neoplasm of breast: Secondary | ICD-10-CM | POA: Diagnosis not present

## 2018-03-29 DIAGNOSIS — M48062 Spinal stenosis, lumbar region with neurogenic claudication: Secondary | ICD-10-CM | POA: Diagnosis not present

## 2018-04-03 ENCOUNTER — Other Ambulatory Visit: Payer: Self-pay | Admitting: Internal Medicine

## 2018-04-03 DIAGNOSIS — Z1231 Encounter for screening mammogram for malignant neoplasm of breast: Secondary | ICD-10-CM

## 2018-04-05 ENCOUNTER — Encounter: Payer: Self-pay | Admitting: Student

## 2018-04-06 ENCOUNTER — Ambulatory Visit
Admission: RE | Admit: 2018-04-06 | Discharge: 2018-04-06 | Disposition: A | Discharge status: Home / Self Care | Payer: Medicare HMO | Source: Ambulatory Visit | Attending: Gastroenterology | Admitting: Gastroenterology

## 2018-04-06 ENCOUNTER — Encounter: Payer: Self-pay | Admitting: *Deleted

## 2018-04-06 ENCOUNTER — Ambulatory Visit: Payer: Medicare HMO | Admitting: Anesthesiology

## 2018-04-06 ENCOUNTER — Encounter
Admission: RE | Disposition: A | Discharge status: Home / Self Care | Payer: Self-pay | Source: Ambulatory Visit | Attending: Gastroenterology

## 2018-04-06 DIAGNOSIS — R05 Cough: Secondary | ICD-10-CM | POA: Diagnosis not present

## 2018-04-06 DIAGNOSIS — F17200 Nicotine dependence, unspecified, uncomplicated: Secondary | ICD-10-CM | POA: Insufficient documentation

## 2018-04-06 DIAGNOSIS — M1990 Unspecified osteoarthritis, unspecified site: Secondary | ICD-10-CM | POA: Diagnosis not present

## 2018-04-06 DIAGNOSIS — Z7951 Long term (current) use of inhaled steroids: Secondary | ICD-10-CM | POA: Insufficient documentation

## 2018-04-06 DIAGNOSIS — Z86010 Personal history of colonic polyps: Secondary | ICD-10-CM | POA: Insufficient documentation

## 2018-04-06 DIAGNOSIS — I8393 Asymptomatic varicose veins of bilateral lower extremities: Secondary | ICD-10-CM | POA: Diagnosis not present

## 2018-04-06 DIAGNOSIS — G43909 Migraine, unspecified, not intractable, without status migrainosus: Secondary | ICD-10-CM | POA: Insufficient documentation

## 2018-04-06 DIAGNOSIS — K2270 Barrett's esophagus without dysplasia: Secondary | ICD-10-CM | POA: Diagnosis not present

## 2018-04-06 DIAGNOSIS — K621 Rectal polyp: Secondary | ICD-10-CM | POA: Diagnosis not present

## 2018-04-06 DIAGNOSIS — D123 Benign neoplasm of transverse colon: Secondary | ICD-10-CM | POA: Diagnosis not present

## 2018-04-06 DIAGNOSIS — K5730 Diverticulosis of large intestine without perforation or abscess without bleeding: Secondary | ICD-10-CM | POA: Diagnosis not present

## 2018-04-06 DIAGNOSIS — Z79899 Other long term (current) drug therapy: Secondary | ICD-10-CM | POA: Diagnosis not present

## 2018-04-06 DIAGNOSIS — D122 Benign neoplasm of ascending colon: Secondary | ICD-10-CM | POA: Insufficient documentation

## 2018-04-06 DIAGNOSIS — F329 Major depressive disorder, single episode, unspecified: Secondary | ICD-10-CM | POA: Insufficient documentation

## 2018-04-06 DIAGNOSIS — K635 Polyp of colon: Secondary | ICD-10-CM | POA: Diagnosis not present

## 2018-04-06 DIAGNOSIS — K219 Gastro-esophageal reflux disease without esophagitis: Secondary | ICD-10-CM | POA: Diagnosis not present

## 2018-04-06 DIAGNOSIS — K224 Dyskinesia of esophagus: Secondary | ICD-10-CM | POA: Diagnosis not present

## 2018-04-06 DIAGNOSIS — Z9842 Cataract extraction status, left eye: Secondary | ICD-10-CM | POA: Diagnosis not present

## 2018-04-06 DIAGNOSIS — Z1211 Encounter for screening for malignant neoplasm of colon: Secondary | ICD-10-CM | POA: Diagnosis not present

## 2018-04-06 DIAGNOSIS — K5790 Diverticulosis of intestine, part unspecified, without perforation or abscess without bleeding: Secondary | ICD-10-CM | POA: Diagnosis not present

## 2018-04-06 DIAGNOSIS — J449 Chronic obstructive pulmonary disease, unspecified: Secondary | ICD-10-CM | POA: Diagnosis not present

## 2018-04-06 DIAGNOSIS — D128 Benign neoplasm of rectum: Secondary | ICD-10-CM | POA: Diagnosis not present

## 2018-04-06 HISTORY — PX: COLONOSCOPY WITH PROPOFOL: SHX5780

## 2018-04-06 HISTORY — DX: Gastritis, unspecified, without bleeding: K29.70

## 2018-04-06 HISTORY — DX: Duodenitis without bleeding: K29.80

## 2018-04-06 HISTORY — DX: Tobacco use: Z72.0

## 2018-04-06 HISTORY — DX: Dyskinesia of esophagus: K22.4

## 2018-04-06 SURGERY — COLONOSCOPY WITH PROPOFOL
Anesthesia: General

## 2018-04-06 MED ORDER — PHENYLEPHRINE HCL 10 MG/ML IJ SOLN
INTRAMUSCULAR | Status: AC
  Filled 2018-04-06: qty 1

## 2018-04-06 MED ORDER — FENTANYL CITRATE (PF) 100 MCG/2ML IJ SOLN
INTRAMUSCULAR | Status: AC
  Filled 2018-04-06: qty 2

## 2018-04-06 MED ORDER — MIDAZOLAM HCL 2 MG/2ML IJ SOLN
INTRAMUSCULAR | Status: DC | PRN
  Administered 2018-04-06: 2 mg via INTRAVENOUS

## 2018-04-06 MED ORDER — MIDAZOLAM HCL 2 MG/2ML IJ SOLN
INTRAMUSCULAR | Status: AC
  Filled 2018-04-06: qty 2

## 2018-04-06 MED ORDER — FENTANYL CITRATE (PF) 100 MCG/2ML IJ SOLN
INTRAMUSCULAR | Status: DC | PRN
  Administered 2018-04-06 (×2): 50 ug via INTRAVENOUS

## 2018-04-06 MED ORDER — PROPOFOL 10 MG/ML IV BOLUS
INTRAVENOUS | Status: AC
  Filled 2018-04-06: qty 20

## 2018-04-06 MED ORDER — PHENYLEPHRINE HCL 10 MG/ML IJ SOLN
INTRAMUSCULAR | Status: DC | PRN
  Administered 2018-04-06 (×2): 50 ug via INTRAVENOUS

## 2018-04-06 MED ORDER — PROPOFOL 500 MG/50ML IV EMUL
INTRAVENOUS | Status: DC | PRN
  Administered 2018-04-06: 120 ug/kg/min via INTRAVENOUS

## 2018-04-06 MED ORDER — SODIUM CHLORIDE 0.9 % IV SOLN
INTRAVENOUS | Status: DC
  Administered 2018-04-06: 1000 mL via INTRAVENOUS

## 2018-04-06 MED ORDER — PROPOFOL 500 MG/50ML IV EMUL
INTRAVENOUS | Status: AC
  Filled 2018-04-06: qty 50

## 2018-04-06 NOTE — H&P (Signed)
Outpatient short stay form Pre-procedure 04/06/2018 11:18 AM Morgan Sails MD  Primary Physician: Dr Tracie Harrier  Reason for visit: Colonoscopy  History of present illness: Patient is a 72 year old female presenting today as above.  She has personal history of adenomatous colon polyps with her last colonoscopy being in 2014.  Tolerating her prep well.  She takes no aspirin or blood thinning agent.    Current Facility-Administered Medications:  .  0.9 %  sodium chloride infusion, , Intravenous, Continuous, Morgan Sails, MD, Last Rate: 20 mL/hr at 04/06/18 1028, 1,000 mL at 04/06/18 1028  Medications Prior to Admission  Medication Sig Dispense Refill Last Dose  . albuterol (PROVENTIL HFA;VENTOLIN HFA) 108 (90 Base) MCG/ACT inhaler Inhale 2 puffs into the lungs every 6 (six) hours as needed for wheezing or shortness of breath. 1 Inhaler 2 04/06/2018 at 0530  . Budesonide-Formoterol Fumarate (SYMBICORT IN) Inhale into the lungs 2 (two) times daily as needed.   04/06/2018 at 0530  . buPROPion (WELLBUTRIN XL) 150 MG 24 hr tablet Take 150 mg by mouth daily.     . cetirizine (ZYRTEC) 10 MG tablet Take 10 mg by mouth daily.   04/05/2018 at Unknown time  . fluticasone (FLONASE) 50 MCG/ACT nasal spray Place 2 sprays into both nostrils daily.   Past Week at Unknown time  . furosemide (LASIX) 20 MG tablet Take 20 mg by mouth daily.   04/05/2018 at Unknown time  . gabapentin (NEURONTIN) 100 MG capsule Take 100 mg by mouth 3 (three) times daily.   Past Month at Unknown time  . montelukast (SINGULAIR) 10 MG tablet Take 10 mg by mouth at bedtime.   04/05/2018 at Unknown time  . omeprazole (PRILOSEC) 40 MG capsule Take 40 mg by mouth daily.   04/05/2018 at Unknown time  . orphenadrine (NORFLEX) 100 MG tablet Take 1 tablet (100 mg total) by mouth 2 (two) times daily. 10 tablet 0 04/05/2018 at Unknown time  . pramipexole (MIRAPEX) 0.5 MG tablet Take 0.5 mg by mouth 3 (three) times daily.   Past Week at Unknown  time  . rOPINIRole (REQUIP) 0.25 MG tablet Take 0.25 mg by mouth 3 (three) times daily.   04/05/2018 at Unknown time  . sertraline (ZOLOFT) 50 MG tablet Take 50 mg by mouth 2 (two) times daily. 50 mg AM, 25 mg PM   04/05/2018 at Unknown time  . tiZANidine (ZANAFLEX) 2 MG tablet Take 2 mg by mouth as needed for muscle spasms.   04/05/2018 at Unknown time     Allergies  Allergen Reactions  . Bacitracin Rash     Past Medical History:  Diagnosis Date  . Allergic state   . Arthritis   . Barrett's esophagus   . Chronic cough   . COPD (chronic obstructive pulmonary disease) ([place])   . Depression   . Duodenitis   . Esophageal motility disorder   . Gastritis   . GERD (gastroesophageal reflux disease)   . Headache    migraines - 3-4x/yr  . Tobacco abuse disorder   . Varicose veins of legs     Review of systems:      Physical Exam    Heart and lungs: Good rate and rhythm without rub or gallop, lungs are bilaterally clear.    HEENT: Normocephalic atraumatic eyes are anicteric    Other:    Pertinant exam for procedure: Soft nontender nondistended bowel sounds positive normoactive    Planned proceedures: Colonoscopy and indicated procedures. I have discussed  the risks benefits and complications of procedures to include not limited to bleeding, infection, perforation and the risk of sedation and the patient wishes to proceed.    Morgan Sails, MD Gastroenterology 04/06/2018  11:18 AM

## 2018-04-06 NOTE — Op Note (Signed)
[facility] Gastroenterology Patient Name: Morgan Valdez Procedure Date: 04/06/2018 11:17 AM MRN: [id number] Account #: [id number] Date of Birth: Apr 30, 1946 Admit Type: Outpatient Age: 72 Room: [facility] ENDO ROOM 3 Gender: Female Note Status: Finalized Procedure:            Colonoscopy Indications:          Personal history of colonic polyps Providers:            Lollie Sails, MD Referring MD:         Tracie Harrier, MD (Referring MD) Medicines:            Monitored Anesthesia Care Complications:        No immediate complications. Procedure:            Pre-Anesthesia Assessment:                       - ASA Grade Assessment: III - A patient with severe                        systemic disease.                       After obtaining informed consent, the colonoscope was                        passed under direct vision. Throughout the procedure,                        the patient's blood pressure, pulse, and oxygen                        saturations were monitored continuously. The                        Colonoscope was introduced through the anus and                        advanced to the the cecum, identified by appendiceal                        orifice and ileocecal valve. The colonoscopy was                        unusually difficult due to multiple diverticula in the                        colon and a tortuous colon. Successful completion of                        the procedure was aided by changing the patient to a                        supine position and changing the patient to a prone                        position. The patient tolerated the procedure well. The                        quality of the bowel preparation was good. Findings:      Two sessile polyps were  found in the proximal transverse colon. The       polyps were 3 to 4 mm in size. These polyps were removed with a cold       snare. Resection and retrieval were complete.      Three sessile  polyps were found in the proximal ascending colon. The       polyps were 3 to 6 mm in size. These polyps were removed with a cold       snare. Resection and retrieval were complete.      Two sessile polyps were found in the rectum. The polyps were 1 to 3 mm       in size. These polyps were removed with a cold biopsy forceps. Resection       and retrieval were complete.      The retroflexed view of the distal rectum and anal verge was normal and       showed no anal or rectal abnormalities otherwise.      Many small and large-mouthed diverticula were found in the sigmoid       colon, descending colon and transverse colon.      The digital rectal exam was normal. Impression:           - Two 3 to 4 mm polyps in the proximal transverse                        colon, removed with a cold snare. Resected and                        retrieved.                       - Three 3 to 6 mm polyps in the proximal ascending                        colon, removed with a cold snare. Resected and                        retrieved.                       - Two 1 to 3 mm polyps in the rectum, removed with a                        cold biopsy forceps. Resected and retrieved.                       - The distal rectum and anal verge are normal on                        retroflexion view.                       - Diverticulosis in the sigmoid colon, in the                        descending colon and in the transverse colon. Recommendation:       - Discharge patient to home.                       - Await pathology results.                       -  Telephone GI clinic for pathology results in 1 week. Procedure Code(s):    --- Professional ---                       [phone], Colonoscopy, flexible; with removal of tumor(s),                        polyp(s), or other lesion(s) by snare technique                       45380, 92, Colonoscopy, flexible; with biopsy, single                        or multiple Diagnosis Code(s):     --- Professional ---                       D12.3, Benign neoplasm of transverse colon (hepatic                        flexure or splenic flexure)                       D12.2, Benign neoplasm of ascending colon                       K62.1, Rectal polyp                       Z86.010, Personal history of colonic polyps                       K57.30, Diverticulosis of large intestine without                        perforation or abscess without bleeding CPT copyright 2017 American Medical Association. All rights reserved. The codes documented in this report are preliminary and upon coder review may  be revised to meet current compliance requirements. Lollie Sails, MD 04/06/2018 12:01:54 PM This report has been signed electronically. Number of Addenda: 0 Note Initiated On: 04/06/2018 11:17 AM Scope Withdrawal Time: 0 hours 9 minutes 42 seconds  Total Procedure Duration: 0 hours 33 minutes 4 seconds       [facility]

## 2018-04-06 NOTE — Transfer of Care (Signed)
Immediate Anesthesia Transfer of Care Note  Patient: Morgan Valdez  Procedure(s) Performed: COLONOSCOPY WITH PROPOFOL (N/A )  Patient Location: PACU  Anesthesia Type:General  Level of Consciousness: awake and sedated  Airway & Oxygen Therapy: Patient Spontanous Breathing and Patient connected to face mask oxygen  Post-op Assessment: Report given to RN and Post -op Vital signs reviewed and stable  Post vital signs: Reviewed and stable  Last Vitals:  Vitals Value Taken Time  BP    Temp    Pulse    Resp    SpO2      Last Pain:  Vitals:   04/06/18 1011  TempSrc: Tympanic  PainSc: 0-No pain         Complications: No apparent anesthesia complications

## 2018-04-06 NOTE — Anesthesia Post-op Follow-up Note (Signed)
Anesthesia QCDR form completed.        

## 2018-04-06 NOTE — Anesthesia Preprocedure Evaluation (Signed)
Anesthesia Evaluation  Patient identified by MRN, date of birth, ID band Patient awake    Reviewed: Allergy & Precautions, H&P , NPO status , Patient's Chart, lab work & pertinent test results  History of Anesthesia Complications Negative for: history of anesthetic complications  Airway Mallampati: III  TM Distance: <3 FB Neck ROM: limited    Dental  (+) Chipped, Poor Dentition   Pulmonary shortness of breath and with exertion, COPD, Current Smoker,           Cardiovascular Exercise Tolerance: Good (-) angina+ Peripheral Vascular Disease  (-) Past MI      Neuro/Psych  Headaches, PSYCHIATRIC DISORDERS Depression    GI/Hepatic Neg liver ROS, GERD  Medicated and Controlled,  Endo/Other  negative endocrine ROS  Renal/GU negative Renal ROS  negative genitourinary   Musculoskeletal   Abdominal   Peds  Hematology negative hematology ROS (+)   Anesthesia Other Findings Past Medical History: No date: Allergic state No date: Arthritis No date: Barrett's esophagus No date: Chronic cough No date: COPD (chronic obstructive pulmonary disease) ([facility]) No date: Depression No date: Duodenitis No date: Esophageal motility disorder No date: Gastritis No date: GERD (gastroesophageal reflux disease) No date: Headache     Comment:  migraines - 3-4x/yr No date: Tobacco abuse disorder No date: Varicose veins of legs  Past Surgical History: No date: APPENDECTOMY 01/03/2017: CATARACT EXTRACTION W/PHACO; Right     Comment:  Procedure: CATARACT EXTRACTION PHACO AND INTRAOCULAR               LENS PLACEMENT ([place])  Right;  Surgeon: Ronnell Freshwater, MD;  Location: [place];  Service:              Ophthalmology;  Laterality: Right; 01/17/2017: CATARACT EXTRACTION W/PHACO; Left     Comment:  Procedure: CATARACT EXTRACTION PHACO AND INTRAOCULAR               LENS PLACEMENT (IOC);  Surgeon: Ronnell Freshwater,              MD;  Location: [place];  Service:               Ophthalmology;  Laterality: Left;  left No date: CHOLECYSTECTOMY 10/14/2015: ESOPHAGOGASTRODUODENOSCOPY (EGD) WITH PROPOFOL; N/A     Comment:  Procedure: ESOPHAGOGASTRODUODENOSCOPY (EGD) WITH               PROPOFOL;  Surgeon: Lollie Sails, MD;  Location:               [facility] ENDOSCOPY;  Service: Endoscopy;  Laterality: N/A; No date: EYE SURGERY No date: HERNIA REPAIR  BMI    Body Mass Index:  29.29 kg/m      Reproductive/Obstetrics negative OB ROS                             Anesthesia Physical Anesthesia Plan  ASA: III  Anesthesia Plan: General   Post-op Pain Management:    Induction: Intravenous  PONV Risk Score and Plan: Propofol infusion and TIVA  Airway Management Planned: Natural Airway and Nasal Cannula  Additional Equipment:   Intra-op Plan:   Post-operative Plan:   Informed Consent: I have reviewed the patients History and Physical, chart, labs and discussed the procedure including the risks, benefits and alternatives for the proposed anesthesia with the patient or authorized representative  who has indicated his/her understanding and acceptance.   Dental Advisory Given  Plan Discussed with: Anesthesiologist, CRNA and Surgeon  Anesthesia Plan Comments: (Patient consented for risks of anesthesia including but not limited to:  - adverse reactions to medications - risk of intubation if required - damage to teeth, lips or other oral mucosa - sore throat or hoarseness - Damage to heart, brain, lungs or loss of life  Patient voiced understanding.)        Anesthesia Quick Evaluation

## 2018-04-06 NOTE — Anesthesia Postprocedure Evaluation (Signed)
Anesthesia Post Note  Patient: Morgan Valdez  Procedure(s) Performed: COLONOSCOPY WITH PROPOFOL (N/A )  Patient location during evaluation: Endoscopy Anesthesia Type: General Level of consciousness: awake and alert Pain management: pain level controlled Vital Signs Assessment: post-procedure vital signs reviewed and stable Respiratory status: spontaneous breathing, nonlabored ventilation, respiratory function stable and patient connected to nasal cannula oxygen Cardiovascular status: blood pressure returned to baseline and stable Postop Assessment: no apparent nausea or vomiting Anesthetic complications: no     Last Vitals:  Vitals:   04/06/18 1221 04/06/18 1231  BP: 110/66 (!) 99/53  Pulse: 83 95  Resp: 18 18  Temp:    SpO2: 100% 97%    Last Pain:  Vitals:   04/06/18 1231  TempSrc:   PainSc: 0-No pain                 Precious Haws [place]

## 2018-04-06 NOTE — Anesthesia Procedure Notes (Signed)
Performed by: Cook-Martin, Ericson Nafziger Pre-anesthesia Checklist: Patient identified, Emergency Drugs available, Suction available, Patient being monitored and Timeout performed Patient Re-evaluated:Patient Re-evaluated prior to induction Oxygen Delivery Method: Simple face mask Preoxygenation: Pre-oxygenation with 100% oxygen Induction Type: IV induction Ventilation: Oral airway inserted - appropriate to patient size Placement Confirmation: positive ETCO2 and CO2 detector       

## 2018-04-07 ENCOUNTER — Encounter: Payer: Self-pay | Admitting: Gastroenterology

## 2018-04-07 LAB — SURGICAL PATHOLOGY

## 2018-04-20 ENCOUNTER — Ambulatory Visit
Admission: RE | Admit: 2018-04-20 | Discharge: 2018-04-20 | Disposition: A | Discharge status: Home / Self Care | Payer: Medicare HMO | Source: Ambulatory Visit | Attending: Internal Medicine | Admitting: Internal Medicine

## 2018-04-20 DIAGNOSIS — Z1231 Encounter for screening mammogram for malignant neoplasm of breast: Secondary | ICD-10-CM | POA: Insufficient documentation

## 2018-04-28 DIAGNOSIS — M5416 Radiculopathy, lumbar region: Secondary | ICD-10-CM | POA: Diagnosis not present

## 2018-04-28 DIAGNOSIS — M25561 Pain in right knee: Secondary | ICD-10-CM | POA: Diagnosis not present

## 2018-05-05 DIAGNOSIS — M1711 Unilateral primary osteoarthritis, right knee: Secondary | ICD-10-CM | POA: Diagnosis not present

## 2018-05-05 DIAGNOSIS — M25561 Pain in right knee: Secondary | ICD-10-CM | POA: Diagnosis not present

## 2018-05-09 DIAGNOSIS — M5416 Radiculopathy, lumbar region: Secondary | ICD-10-CM | POA: Diagnosis not present

## 2018-05-09 DIAGNOSIS — M5136 Other intervertebral disc degeneration, lumbar region: Secondary | ICD-10-CM | POA: Diagnosis not present

## 2018-05-23 DIAGNOSIS — M48062 Spinal stenosis, lumbar region with neurogenic claudication: Secondary | ICD-10-CM | POA: Diagnosis not present

## 2018-05-23 DIAGNOSIS — M5416 Radiculopathy, lumbar region: Secondary | ICD-10-CM | POA: Diagnosis not present

## 2018-05-23 DIAGNOSIS — M5136 Other intervertebral disc degeneration, lumbar region: Secondary | ICD-10-CM | POA: Diagnosis not present

## 2018-05-26 DIAGNOSIS — M1711 Unilateral primary osteoarthritis, right knee: Secondary | ICD-10-CM | POA: Diagnosis not present

## 2018-05-26 DIAGNOSIS — M5416 Radiculopathy, lumbar region: Secondary | ICD-10-CM | POA: Diagnosis not present

## 2018-07-10 DIAGNOSIS — R232 Flushing: Secondary | ICD-10-CM | POA: Diagnosis not present

## 2018-07-10 DIAGNOSIS — R110 Nausea: Secondary | ICD-10-CM | POA: Diagnosis not present

## 2018-07-10 DIAGNOSIS — K293 Chronic superficial 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: Secondary | ICD-10-CM | POA: Diagnosis not present

## 2018-07-11 DIAGNOSIS — R61 Generalized hyperhidrosis: Secondary | ICD-10-CM | POA: Diagnosis not present

## 2018-07-13 DIAGNOSIS — S299XXA Unspecified injury of thorax, initial encounter: Secondary | ICD-10-CM | POA: Diagnosis not present

## 2018-07-21 ENCOUNTER — Other Ambulatory Visit: Payer: Self-pay | Admitting: Family Medicine

## 2018-07-21 DIAGNOSIS — R112 Nausea with vomiting, unspecified: Secondary | ICD-10-CM

## 2018-07-21 DIAGNOSIS — R61 Generalized hyperhidrosis: Secondary | ICD-10-CM | POA: Diagnosis not present

## 2018-07-21 DIAGNOSIS — R1013 Epigastric pain: Secondary | ICD-10-CM | POA: Diagnosis not present

## 2018-07-23 ENCOUNTER — Emergency Department: Payer: Medicare HMO

## 2018-07-23 ENCOUNTER — Other Ambulatory Visit: Payer: Self-pay

## 2018-07-23 ENCOUNTER — Inpatient Hospital Stay: Payer: Medicare HMO

## 2018-07-23 ENCOUNTER — Inpatient Hospital Stay
Admission: EM | Admit: 2018-07-23 | Discharge: 2018-07-25 | DRG: 872 | Disposition: A | Discharge status: Home / Self Care | Payer: Medicare HMO | Attending: Internal Medicine | Admitting: Internal Medicine

## 2018-07-23 DIAGNOSIS — Z9889 Other specified postprocedural states: Secondary | ICD-10-CM | POA: Diagnosis not present

## 2018-07-23 DIAGNOSIS — A419 Sepsis, unspecified organism: Secondary | ICD-10-CM | POA: Diagnosis not present

## 2018-07-23 DIAGNOSIS — J449 Chronic obstructive pulmonary disease, unspecified: Secondary | ICD-10-CM | POA: Diagnosis present

## 2018-07-23 DIAGNOSIS — K224 Dyskinesia of esophagus: Secondary | ICD-10-CM | POA: Diagnosis present

## 2018-07-23 DIAGNOSIS — J302 Other seasonal allergic rhinitis: Secondary | ICD-10-CM | POA: Diagnosis present

## 2018-07-23 DIAGNOSIS — R05 Cough: Secondary | ICD-10-CM | POA: Diagnosis not present

## 2018-07-23 DIAGNOSIS — R918 Other nonspecific abnormal finding of lung field: Secondary | ICD-10-CM | POA: Diagnosis not present

## 2018-07-23 DIAGNOSIS — Z7951 Long term (current) use of inhaled steroids: Secondary | ICD-10-CM

## 2018-07-23 DIAGNOSIS — K219 Gastro-esophageal reflux disease without esophagitis: Secondary | ICD-10-CM | POA: Diagnosis present

## 2018-07-23 DIAGNOSIS — J40 Bronchitis, not specified as acute or chronic: Secondary | ICD-10-CM | POA: Diagnosis not present

## 2018-07-23 DIAGNOSIS — F329 Major depressive disorder, single episode, unspecified: Secondary | ICD-10-CM | POA: Diagnosis not present

## 2018-07-23 DIAGNOSIS — A4151 Sepsis due to Escherichia coli [E. coli]: Principal | ICD-10-CM | POA: Diagnosis present

## 2018-07-23 DIAGNOSIS — R509 Fever, unspecified: Secondary | ICD-10-CM | POA: Diagnosis not present

## 2018-07-23 DIAGNOSIS — Z9049 Acquired absence of other specified parts of digestive tract: Secondary | ICD-10-CM | POA: Diagnosis not present

## 2018-07-23 DIAGNOSIS — Z720 Tobacco use: Secondary | ICD-10-CM | POA: Diagnosis not present

## 2018-07-23 DIAGNOSIS — F17210 Nicotine dependence, cigarettes, uncomplicated: Secondary | ICD-10-CM | POA: Diagnosis present

## 2018-07-23 DIAGNOSIS — R0602 Shortness of breath: Secondary | ICD-10-CM | POA: Diagnosis not present

## 2018-07-23 DIAGNOSIS — Z79899 Other long term (current) drug therapy: Secondary | ICD-10-CM | POA: Diagnosis not present

## 2018-07-23 DIAGNOSIS — I8393 Asymptomatic varicose veins of bilateral lower extremities: Secondary | ICD-10-CM | POA: Diagnosis present

## 2018-07-23 DIAGNOSIS — J189 Pneumonia, unspecified organism: Secondary | ICD-10-CM

## 2018-07-23 DIAGNOSIS — R7881 Bacteremia: Secondary | ICD-10-CM | POA: Diagnosis not present

## 2018-07-23 DIAGNOSIS — M1990 Unspecified osteoarthritis, unspecified site: Secondary | ICD-10-CM | POA: Diagnosis not present

## 2018-07-23 DIAGNOSIS — Z888 Allergy status to other drugs, medicaments and biological substances status: Secondary | ICD-10-CM | POA: Diagnosis not present

## 2018-07-23 DIAGNOSIS — K2270 Barrett's esophagus without dysplasia: Secondary | ICD-10-CM | POA: Diagnosis not present

## 2018-07-23 DIAGNOSIS — I700 Atherosclerosis of aorta: Secondary | ICD-10-CM | POA: Diagnosis not present

## 2018-07-23 DIAGNOSIS — J181 Lobar pneumonia, unspecified organism: Secondary | ICD-10-CM

## 2018-07-23 LAB — CBC WITH DIFFERENTIAL/PLATELET
Basophils Absolute: 0 10*3/uL (ref 0–0.1)
Basophils Relative: 0 %
Eosinophils Absolute: 0.1 10*3/uL (ref 0–0.7)
Eosinophils Relative: 1 %
HCT: 44.4 % (ref 35.0–47.0)
Hemoglobin: 15.2 g/dL (ref 12.0–16.0)
Lymphocytes Relative: 4 %
Lymphs Abs: 0.5 10*3/uL — ABNORMAL LOW (ref 1.0–3.6)
MCH: 31 pg (ref 26.0–34.0)
MCHC: 34.2 g/dL (ref 32.0–36.0)
MCV: 90.6 fL (ref 80.0–100.0)
Monocytes Absolute: 0.1 10*3/uL — ABNORMAL LOW (ref 0.2–0.9)
Monocytes Relative: 1 %
Neutro Abs: 13.8 10*3/uL — ABNORMAL HIGH (ref 1.4–6.5)
Neutrophils Relative %: 94 %
Platelets: 244 10*3/uL (ref 150–440)
RBC: 4.9 MIL/uL (ref 3.80–5.20)
RDW: 14.1 % (ref 11.5–14.5)
WBC: 14.5 10*3/uL — ABNORMAL HIGH (ref 3.6–11.0)

## 2018-07-23 LAB — URINALYSIS, ROUTINE W REFLEX MICROSCOPIC
Bilirubin Urine: NEGATIVE
Glucose, UA: NEGATIVE mg/dL
Hgb urine dipstick: NEGATIVE
Ketones, ur: NEGATIVE mg/dL
Leukocytes, UA: NEGATIVE
Nitrite: NEGATIVE
Protein, ur: NEGATIVE mg/dL
Specific Gravity, Urine: 1.014 (ref 1.005–1.030)
pH: 6 (ref 5.0–8.0)

## 2018-07-23 LAB — LIPASE, BLOOD: Lipase: 22 U/L (ref 11–51)

## 2018-07-23 LAB — BLOOD CULTURE ID PANEL (REFLEXED)

## 2018-07-23 LAB — COMPREHENSIVE METABOLIC PANEL
ALT: 15 U/L (ref 0–44)
AST: 17 U/L (ref 15–41)
Albumin: 3.5 g/dL (ref 3.5–5.0)
Alkaline Phosphatase: 72 U/L (ref 38–126)
Anion gap: 10 (ref 5–15)
BUN: 16 mg/dL (ref 8–23)
CO2: 28 mmol/L (ref 22–32)
Calcium: 8.5 mg/dL — ABNORMAL LOW (ref 8.9–10.3)
Chloride: 102 mmol/L (ref 98–111)
Creatinine, Ser: 0.99 mg/dL (ref 0.44–1.00)
GFR calc Af Amer: >60 mL/min (ref >60)
GFR calc non Af Amer: 56 mL/min — ABNORMAL LOW (ref >60)
Glucose, Bld: 128 mg/dL — ABNORMAL HIGH (ref 70–99)
Potassium: 4.5 mmol/L (ref 3.5–5.1)
Sodium: 140 mmol/L (ref 135–145)
Total Bilirubin: 0.6 mg/dL (ref 0.3–1.2)
Total Protein: 6.3 g/dL — ABNORMAL LOW (ref 6.5–8.1)

## 2018-07-23 LAB — LACTIC ACID, PLASMA
Lactic Acid, Venous: 2 mmol/L (ref 0.5–1.9)
Lactic Acid, Venous: 2.4 mmol/L (ref 0.5–1.9)
Lactic Acid, Venous: 3.6 mmol/L (ref 0.5–1.9)

## 2018-07-23 LAB — CREATININE, SERUM
Creatinine, Ser: 1.01 mg/dL — ABNORMAL HIGH (ref 0.44–1.00)
GFR calc Af Amer: >60 mL/min (ref >60)
GFR calc non Af Amer: 55 mL/min — ABNORMAL LOW (ref >60)

## 2018-07-23 LAB — INFLUENZA PANEL BY PCR (TYPE A & B)
Influenza A By PCR: NEGATIVE
Influenza B By PCR: NEGATIVE

## 2018-07-23 LAB — PROTIME-INR
INR: 0.9
Prothrombin Time: 12.1 seconds (ref 11.4–15.2)

## 2018-07-23 LAB — CBC
HCT: 43 % (ref 35.0–47.0)
Hemoglobin: 14.9 g/dL (ref 12.0–16.0)
MCH: 31.1 pg (ref 26.0–34.0)
MCHC: 34.7 g/dL (ref 32.0–36.0)
MCV: 89.7 fL (ref 80.0–100.0)
Platelets: 237 10*3/uL (ref 150–440)
RBC: 4.8 MIL/uL (ref 3.80–5.20)
RDW: 14.4 % (ref 11.5–14.5)
WBC: 20.1 10*3/uL — ABNORMAL HIGH (ref 3.6–11.0)

## 2018-07-23 LAB — PROCALCITONIN: Procalcitonin: 20.72 ng/mL

## 2018-07-23 LAB — TROPONIN I: Troponin I: <0.03 ng/mL (ref <0.03)

## 2018-07-23 MED ORDER — SODIUM CHLORIDE 0.9 % IV SOLN
2.0000 g | Freq: Three times a day (TID) | INTRAVENOUS | Status: DC
  Administered 2018-07-24: 01:00:00 2 g via INTRAVENOUS
  Filled 2018-07-23 (×4): qty 2

## 2018-07-23 MED ORDER — ONDANSETRON HCL 4 MG PO TABS
4.0000 mg | ORAL_TABLET | Freq: Four times a day (QID) | ORAL | Status: DC | PRN
  Administered 2018-07-24 – 2018-07-25 (×3): 4 mg via ORAL
  Filled 2018-07-23 (×3): qty 1

## 2018-07-23 MED ORDER — ROPINIROLE HCL 0.25 MG PO TABS
0.2500 mg | ORAL_TABLET | Freq: Three times a day (TID) | ORAL | Status: DC
  Administered 2018-07-23: 23:00:00 0.25 mg via ORAL
  Filled 2018-07-23 (×3): qty 1

## 2018-07-23 MED ORDER — LEVALBUTEROL HCL 0.63 MG/3ML IN NEBU
0.6300 mg | INHALATION_SOLUTION | Freq: Four times a day (QID) | RESPIRATORY_TRACT | Status: DC
  Administered 2018-07-23 – 2018-07-24 (×3): 0.63 mg via RESPIRATORY_TRACT
  Filled 2018-07-23 (×3): qty 3

## 2018-07-23 MED ORDER — IOPAMIDOL (ISOVUE-300) INJECTION 61%
15.0000 mL | INTRAVENOUS | Status: AC
  Administered 2018-07-23 (×2): 15 mL via ORAL

## 2018-07-23 MED ORDER — IPRATROPIUM-ALBUTEROL 0.5-2.5 (3) MG/3ML IN SOLN
3.0000 mL | Freq: Once | RESPIRATORY_TRACT | Status: AC
  Administered 2018-07-23: 3 mL via RESPIRATORY_TRACT
  Filled 2018-07-23: qty 3

## 2018-07-23 MED ORDER — BUPROPION HCL ER (SR) 150 MG PO TB12
150.0000 mg | ORAL_TABLET | Freq: Two times a day (BID) | ORAL | Status: DC
  Administered 2018-07-23 – 2018-07-25 (×4): 150 mg via ORAL
  Filled 2018-07-23 (×5): qty 1

## 2018-07-23 MED ORDER — SODIUM CHLORIDE 0.9 % IV SOLN
500.0000 mg | INTRAVENOUS | Status: DC
  Administered 2018-07-23: 500 mg via INTRAVENOUS
  Filled 2018-07-23 (×2): qty 500

## 2018-07-23 MED ORDER — FLUTICASONE PROPIONATE 50 MCG/ACT NA SUSP
2.0000 | Freq: Every day | NASAL | Status: DC
  Administered 2018-07-25: 2 via NASAL
  Filled 2018-07-23: qty 16

## 2018-07-23 MED ORDER — METHYLPREDNISOLONE SODIUM SUCC 125 MG IJ SOLR
125.0000 mg | INTRAMUSCULAR | Status: AC
  Administered 2018-07-23: 125 mg via INTRAVENOUS
  Filled 2018-07-23: qty 2

## 2018-07-23 MED ORDER — SODIUM CHLORIDE 0.9 % IV SOLN
500.0000 mg | INTRAVENOUS | Status: DC
  Administered 2018-07-24: 500 mg via INTRAVENOUS
  Filled 2018-07-23: qty 500

## 2018-07-23 MED ORDER — ONDANSETRON HCL 4 MG/2ML IJ SOLN: 4.0000 mg | Freq: Four times a day (QID) | INTRAMUSCULAR | Status: DC | PRN

## 2018-07-23 MED ORDER — LORATADINE 10 MG PO TABS
10.0000 mg | ORAL_TABLET | Freq: Every day | ORAL | Status: DC
  Administered 2018-07-23 – 2018-07-25 (×3): 10 mg via ORAL
  Filled 2018-07-23 (×3): qty 1

## 2018-07-23 MED ORDER — SODIUM CHLORIDE 0.9 % IV BOLUS
500.0000 mL | Freq: Once | INTRAVENOUS | Status: AC
  Administered 2018-07-23: 500 mL via INTRAVENOUS

## 2018-07-23 MED ORDER — IOPAMIDOL (ISOVUE-300) INJECTION 61%
100.0000 mL | Freq: Once | INTRAVENOUS | Status: AC | PRN
  Administered 2018-07-23: 17:00:00 100 mL via INTRAVENOUS

## 2018-07-23 MED ORDER — MONTELUKAST SODIUM 10 MG PO TABS
10.0000 mg | ORAL_TABLET | Freq: Every day | ORAL | Status: DC
  Administered 2018-07-23 – 2018-07-24 (×2): 10 mg via ORAL
  Filled 2018-07-23 (×2): qty 1

## 2018-07-23 MED ORDER — BUDESONIDE-FORMOTEROL FUMARATE 160-4.5 MCG/ACT IN AERO
2.0000 | INHALATION_SPRAY | Freq: Two times a day (BID) | RESPIRATORY_TRACT | Status: DC
  Administered 2018-07-24 – 2018-07-25 (×2): 2 via RESPIRATORY_TRACT
  Filled 2018-07-23 (×2): qty 6

## 2018-07-23 MED ORDER — SERTRALINE HCL 50 MG PO TABS
25.0000 mg | ORAL_TABLET | Freq: Every day | ORAL | Status: DC
  Filled 2018-07-23: qty 1

## 2018-07-23 MED ORDER — ACETAMINOPHEN 650 MG RE SUPP: 650.0000 mg | Freq: Four times a day (QID) | RECTAL | Status: DC | PRN

## 2018-07-23 MED ORDER — SODIUM CHLORIDE 0.9 % IV SOLN
1.0000 g | INTRAVENOUS | Status: DC
  Filled 2018-07-23: qty 10

## 2018-07-23 MED ORDER — SERTRALINE HCL 50 MG PO TABS
50.0000 mg | ORAL_TABLET | Freq: Every morning | ORAL | Status: DC
  Administered 2018-07-25: 09:00:00 50 mg via ORAL
  Filled 2018-07-23 (×2): qty 1

## 2018-07-23 MED ORDER — SODIUM CHLORIDE 0.9 % IV SOLN
2.0000 g | INTRAVENOUS | Status: DC
  Administered 2018-07-23: 2 g via INTRAVENOUS
  Filled 2018-07-23 (×2): qty 20

## 2018-07-23 MED ORDER — PANTOPRAZOLE SODIUM 40 MG PO TBEC
40.0000 mg | DELAYED_RELEASE_TABLET | Freq: Every day | ORAL | Status: DC
  Administered 2018-07-23: 17:00:00 40 mg via ORAL
  Filled 2018-07-23 (×2): qty 1

## 2018-07-23 MED ORDER — SODIUM CHLORIDE 0.9 % IV BOLUS
1000.0000 mL | Freq: Once | INTRAVENOUS | Status: AC
  Administered 2018-07-23: 1000 mL via INTRAVENOUS

## 2018-07-23 MED ORDER — ENOXAPARIN SODIUM 40 MG/0.4ML ~~LOC~~ SOLN
40.0000 mg | SUBCUTANEOUS | Status: DC
  Administered 2018-07-23 – 2018-07-24 (×2): 40 mg via SUBCUTANEOUS
  Filled 2018-07-23 (×2): qty 0.4

## 2018-07-23 MED ORDER — ACETAMINOPHEN 325 MG PO TABS
650.0000 mg | ORAL_TABLET | Freq: Four times a day (QID) | ORAL | Status: DC | PRN
  Administered 2018-07-24 – 2018-07-25 (×3): 650 mg via ORAL
  Filled 2018-07-23 (×4): qty 2

## 2018-07-23 MED ORDER — SERTRALINE HCL 50 MG PO TABS: 50.0000 mg | ORAL_TABLET | Freq: Two times a day (BID) | ORAL | Status: DC

## 2018-07-23 MED ORDER — ACETAMINOPHEN 500 MG PO TABS
1000.0000 mg | ORAL_TABLET | ORAL | Status: AC
  Administered 2018-07-23: 1000 mg via ORAL
  Filled 2018-07-23: qty 2

## 2018-07-23 NOTE — Progress Notes (Signed)
Advanced care plan.  Purpose of the Encounter: CODE STATUS  Parties in [place] her self  Patient's Decision Capacity:intact  Subjective/Patient's story:  Patient 72 year old with history of COPD, depression, GERD presenting with complaint of fever shortness of breath  Objective/Medical story  I discussed with the patient regarding her desires for cardiac and pulmonary resuscitation  Goals of care determination: She states that she would like to be a full code    CODE STATUS: Full code   Time spent discussing advanced care planning: 16 minutes

## 2018-07-23 NOTE — Progress Notes (Signed)
CODE SEPSIS - PHARMACY COMMUNICATION  **Broad Spectrum Antibiotics should be administered within 1 hour of Sepsis diagnosis**  Time Code Sepsis Called/Page Received: 10:52  Antibiotics Ordered: ceftriaxone/azithromycin  Time of 1st antibiotic administration: 11:41  Additional action taken by pharmacy: 11:28 spoke with Yong Channel RN - he just finished blood cultures so he'll hang antibiotics now  If necessary, Name of Provider/Nurse Contacted:     Laural Benes, Pharm.D., BCPS Clinical Pharmacist  07/23/2018  10:58 AM

## 2018-07-23 NOTE — ED Provider Notes (Signed)
[facility] Emergency Department Provider Note   ____________________________________________   First MD Initiated Contact with Patient 07/23/18 1044     (approximate)  I have reviewed the triage vital signs and the nursing notes.   HISTORY  Chief Complaint Fever and Shortness of Breath    HPI Morgan Valdez is a 72 y.o. female presents for evaluation of shortness of breath  Here for she does have a history of COPD, gastritis, reflux  Were about the last month she has been noticing that she will have hot flashes and nausea frequently.  However this morning when she got up a little bit last night she noticed the cough is been starting.  Minimally productive, but she started having shaking chills with increased shortness of breath today.  On EMS arrival patient was noted to be febrile temp of 102 with a cough and mild hypoxia.  She took 2 inhaler treatments prior to arrival at home without any relief.  Denies wheezing.  Reports she does feel short of breath with shaking chills and feels generally ill.  No abdominal pain, no nausea present.    Denies recently being on any antibiotics or being in the hospital.  No abdominal pain.  No pain or burning with urination.    Past Medical History:  Diagnosis Date  . Allergic state   . Arthritis   . Barrett's esophagus   . Chronic cough   . COPD (chronic obstructive pulmonary disease) ([place])   . Depression   . Duodenitis   . Esophageal motility disorder   . Gastritis   . GERD (gastroesophageal reflux disease)   . Headache    migraines - 3-4x/yr  . Tobacco abuse disorder   . Varicose veins of legs     There are no active problems to display for this patient.   Past Surgical History:  Procedure Laterality Date  . APPENDECTOMY    . CATARACT EXTRACTION W/PHACO Right 01/03/2017   Procedure: CATARACT EXTRACTION PHACO AND INTRAOCULAR LENS PLACEMENT ([place])  Right;  Surgeon: Ronnell Freshwater, MD;   Location: [place];  Service: Ophthalmology;  Laterality: Right;  . CATARACT EXTRACTION W/PHACO Left 01/17/2017   Procedure: CATARACT EXTRACTION PHACO AND INTRAOCULAR LENS PLACEMENT (IOC);  Surgeon: Ronnell Freshwater, MD;  Location: [place];  Service: Ophthalmology;  Laterality: Left;  left  . CHOLECYSTECTOMY    . COLONOSCOPY WITH PROPOFOL N/A 04/06/2018   Procedure: COLONOSCOPY WITH PROPOFOL;  Surgeon: Lollie Sails, MD;  Location: [facility] ENDOSCOPY;  Service: Endoscopy;  Laterality: N/A;  . ESOPHAGOGASTRODUODENOSCOPY (EGD) WITH PROPOFOL N/A 10/14/2015   Procedure: ESOPHAGOGASTRODUODENOSCOPY (EGD) WITH PROPOFOL;  Surgeon: Lollie Sails, MD;  Location: [facility] ENDOSCOPY;  Service: Endoscopy;  Laterality: N/A;  . EYE SURGERY    . HERNIA REPAIR      Prior to Admission medications   Medication Sig Start Date End Date Taking? Authorizing Provider  albuterol (PROVENTIL HFA;VENTOLIN HFA) 108 (90 Base) MCG/ACT inhaler Inhale 2 puffs into the lungs every 6 (six) hours as needed for wheezing or shortness of breath. 05/04/16   Schuyler Amor, MD  Budesonide-Formoterol Fumarate (SYMBICORT IN) Inhale into the lungs 2 (two) times daily as needed.    [provider]  buPROPion (WELLBUTRIN XL) 150 MG 24 hr tablet Take 150 mg by mouth daily.    [provider]  cetirizine (ZYRTEC) 10 MG tablet Take 10 mg by mouth daily.    [provider]  fluticasone (FLONASE) 50 MCG/ACT nasal  spray Place 2 sprays into both nostrils daily.    [provider]  furosemide (LASIX) 20 MG tablet Take 20 mg by mouth daily.    [provider]  gabapentin (NEURONTIN) 100 MG capsule Take 100 mg by mouth 3 (three) times daily.    [provider]  montelukast (SINGULAIR) 10 MG tablet Take 10 mg by mouth at bedtime.    [provider]  omeprazole (PRILOSEC) 40 MG capsule Take 40 mg by mouth daily.    [provider]  orphenadrine  (NORFLEX) 100 MG tablet Take 1 tablet (100 mg total) by mouth 2 (two) times daily. 05/12/17   Sable Feil, PA-C  pramipexole (MIRAPEX) 0.5 MG tablet Take 0.5 mg by mouth 3 (three) times daily.    [provider]  rOPINIRole (REQUIP) 0.25 MG tablet Take 0.25 mg by mouth 3 (three) times daily.    [provider]  sertraline (ZOLOFT) 50 MG tablet Take 50 mg by mouth 2 (two) times daily. 50 mg AM, 25 mg PM    [provider]  tiZANidine (ZANAFLEX) 2 MG tablet Take 2 mg by mouth as needed for muscle spasms.    [provider]    Allergies Bacitracin  Family History  Problem Relation Age of Onset  . Breast cancer Neg Hx     Social History Social History   Tobacco Use  . Smoking status: Current Every Day Smoker    Packs/day: 0.50    Years: 50.00    Pack years: 25.00  . Smokeless tobacco: Never Used  Substance Use Topics  . Alcohol use: Yes    Comment: rare - Holidays  . Drug use: No    Review of Systems Constitutional: Fevers and chills with fatigue eyes: No visual changes. ENT: No sore throat. Cardiovascular: Denies chest pain. Respiratory: Mild shortness of breath, better on oxygen gastrointestinal: No abdominal pain.  Vomited a couple times earlier today, some nausea intermittently for at least a month.  No diarrhea.  No constipation. Genitourinary: Negative for dysuria. Musculoskeletal: Negative for back pain. Skin: Negative for rash. Neurological: Negative for headaches, focal weakness or numbness.    ____________________________________________   PHYSICAL EXAM:  VITAL SIGNS: ED Triage Vitals  Enc Vitals Group     BP      Pulse      Resp      Temp      Temp src      SpO2      Weight      Height      Head Circumference      Peak Flow      Pain Score      Pain Loc      Pain Edu?      Excl. in [place]?     Constitutional: Alert and oriented.  Fatigued appearing but in no acute distress. Eyes: Conjunctivae are  normal. Head: Atraumatic. Nose: No congestion/rhinnorhea. Mouth/Throat: Mucous membranes are dry. Neck: No stridor.   Cardiovascular: Tachycardic rate, regular rhythm. Grossly normal heart sounds.  Good peripheral circulation. Respiratory: Normal respiratory effort except for some slight tachypnea.  No retractions. Lungs CTAB except for some slight coarseness of breath sounds noted in the right lower lung fields.  There is no wheezing. Gastrointestinal: Soft and nontender. No distention. Musculoskeletal: No lower extremity tenderness nor edema. Neurologic:  Normal speech and language. No gross focal neurologic deficits are appreciated.  Skin:  Skin is warm, dry and intact. No rash noted. Psychiatric: Mood  and affect are normal. Speech and behavior are normal.  ____________________________________________   LABS (all labs ordered are listed, but only abnormal results are displayed)  Labs Reviewed  LACTIC ACID, PLASMA - Abnormal; Notable for the following components:      Result Value   Lactic Acid, Venous 2.0 (*)    All other components within normal limits  COMPREHENSIVE METABOLIC PANEL - Abnormal; Notable for the following components:   Glucose, Bld 128 (*)    Calcium 8.5 (*)    Total Protein 6.3 (*)    GFR calc non Af Amer 56 (*)    All other components within normal limits  CBC WITH DIFFERENTIAL/PLATELET - Abnormal; Notable for the following components:   WBC 14.5 (*)    Neutro Abs 13.8 (*)    Lymphs Abs 0.5 (*)    Monocytes Absolute 0.1 (*)    All other components within normal limits  URINALYSIS, ROUTINE W REFLEX MICROSCOPIC - Abnormal; Notable for the following components:   Color, Urine YELLOW (*)    APPearance CLEAR (*)    All other components within normal limits  CULTURE, BLOOD (ROUTINE X 2)  CULTURE, BLOOD (ROUTINE X 2)  LIPASE, BLOOD  PROTIME-INR  TROPONIN I  INFLUENZA PANEL BY PCR (TYPE A & B)  LACTIC ACID, PLASMA    ____________________________________________  EKG  Reviewed and interpreted by me at 11 AM Heart rate 130 QRS 99 QTc 450 Sinus tachycardia, no evidence of acute ischemia ____________________________________________  RADIOLOGY  Dg Chest Portable 1 View  Result Date: 07/23/2018 CLINICAL DATA:  Acute shortness of breath, fever and cough for 2 days. EXAM: PORTABLE CHEST 1 VIEW COMPARISON:  05/04/2016 chest CT, chest radiograph and prior studies FINDINGS: The cardiomediastinal silhouette is unremarkable. There is no evidence of focal airspace disease, pulmonary edema, suspicious pulmonary nodule/mass, pleural effusion, or pneumothorax. No acute bony abnormalities are identified. Little interval change since the prior study. IMPRESSION: No active disease. Electronically Signed   By: Margarette Canada M.D.   On: 07/23/2018 11:23    ____________________________________________   PROCEDURES  Procedure(s) performed: None  Procedures  Critical Care performed: Yes, see critical care note(s)  CRITICAL CARE Performed by: Delman Kitten   Total critical care time: 35 minutes  Critical care time was exclusive of separately billable procedures and treating other patients.  Critical care was necessary to treat or prevent imminent or life-threatening deterioration.  Critical care was time spent personally by me on the following activities: development of treatment plan with patient and/or surrogate as well as nursing, discussions with consultants, evaluation of patient's response to treatment, examination of patient, obtaining history from patient or surrogate, ordering and performing treatments and interventions, ordering and review of laboratory studies, ordering and review of radiographic studies, pulse oximetry and re-evaluation of patient's condition.  ____________________________________________   INITIAL IMPRESSION / ASSESSMENT AND PLAN / ED COURSE  Pertinent labs & imaging results that  were available during my care of the patient were reviewed by me and considered in my medical decision making (see chart for details).  Clinical history signs and symptoms concerning for acute pulmonary infection.  Rule out influenza, also evaluate for pneumonia, bronchitis, and other infectious etiologies.  Denies cardiac symptoms EKG reassuring.  No chest pain, no pleurisy, and the patient notably febrile.  Also having some nausea will check urinalysis, denies abdominal pain with nontender abdomen without evidence of acute abdomen.  Suspect likely pulmonary or upper respiratory source.  ----------------------------------------- 11:18 AM on 07/23/2018 -----------------------------------------  Code sepsis.  Reassessment.  Heart rate 120.  Oxygenation mid 90s on 2 L nasal cannula.  Patient alert oriented, appears slightly improved.  Continuing fluids and antibiotics at this time.    ----------------------------------------- 12:51 PM on 07/23/2018 -----------------------------------------  Vitals:   07/23/18 1200 07/23/18 1230  BP: (!) 129/58 120/64  Pulse: (!) 118 (!) 111  Resp: (!) 23 16  Temp:    SpO2: (!) 89% (!) 89%     Patient feeling improved.  Heart rate is improved notably since presentation, has received IV fluids.  Lab work clinical examination and history seem consistent with a right lower lobe pneumonia given her focal rales cough and shortness of breath, will also treat for mild COPD exacerbation given her associated hypoxia though she really does not have notable wheezing on exam and her respiratory effort is much improved.  I suspect clinically that she has right lower lobe pneumonia, chest x-ray on single view however does not demonstrate a notable infiltrate at this time.  Discussed with the hospitalist service, patient being admitted for further work-up and care.  ____________________________________________   FINAL CLINICAL IMPRESSION(S) / ED DIAGNOSES  Final  diagnoses:  Sepsis, due to unspecified organism ([place])  Bronchitis  Pneumonia of right lower lobe due to infectious organism [facility])      NEW MEDICATIONS STARTED DURING THIS VISIT:  New Prescriptions   No medications on file     Note:  This document was prepared using Dragon voice recognition software and may include unintentional dictation errors.     Delman Kitten, MD 07/23/18 1253

## 2018-07-23 NOTE — H&P (Signed)
Morgan Valdez at [place] NAME: Morgan Valdez    MR#:  [id number]  DATE OF BIRTH:  04-28-1946  DATE OF ADMISSION:  07/23/2018  PRIMARY CARE PHYSICIAN: Tracie Harrier, MD   REQUESTING/REFERRING PHYSICIAN: Delman Kitten, MD  CHIEF COMPLAINT:   Chief Complaint  Patient presents with  . Fever  . Shortness of Breath    HISTORY OF PRESENT ILLNESS: Morgan Valdez  is a 72 y.o. female with a known history of Barrett's esophagus, COPD, depression, GERD who has been seen by her primary care provider because she has been having complaint of nausea and intermittent hot flashes.  Patient has had chest x-rays done outpatient as well as blood work done.  She reports that today she started feeling like she had a fever and started having shortness of breath.  In the ER patient's chest x-ray is negative.  Is noted to have leukocytosis.    PAST MEDICAL HISTORY:   Past Medical History:  Diagnosis Date  . Allergic state   . Arthritis   . Barrett's esophagus   . Chronic cough   . COPD (chronic obstructive pulmonary disease) ([place])   . Depression   . Duodenitis   . Esophageal motility disorder   . Gastritis   . GERD (gastroesophageal reflux disease)   . Headache    migraines - 3-4x/yr  . Tobacco abuse disorder   . Varicose veins of legs     PAST SURGICAL HISTORY:  Past Surgical History:  Procedure Laterality Date  . APPENDECTOMY    . CATARACT EXTRACTION W/PHACO Right 01/03/2017   Procedure: CATARACT EXTRACTION PHACO AND INTRAOCULAR LENS PLACEMENT ([place])  Right;  Surgeon: Ronnell Freshwater, MD;  Location: [place];  Service: Ophthalmology;  Laterality: Right;  . CATARACT EXTRACTION W/PHACO Left 01/17/2017   Procedure: CATARACT EXTRACTION PHACO AND INTRAOCULAR LENS PLACEMENT (IOC);  Surgeon: Ronnell Freshwater, MD;  Location: [place];  Service: Ophthalmology;  Laterality: Left;  left  . CHOLECYSTECTOMY    . COLONOSCOPY WITH  PROPOFOL N/A 04/06/2018   Procedure: COLONOSCOPY WITH PROPOFOL;  Surgeon: Lollie Sails, MD;  Location: [facility] ENDOSCOPY;  Service: Endoscopy;  Laterality: N/A;  . ESOPHAGOGASTRODUODENOSCOPY (EGD) WITH PROPOFOL N/A 10/14/2015   Procedure: ESOPHAGOGASTRODUODENOSCOPY (EGD) WITH PROPOFOL;  Surgeon: Lollie Sails, MD;  [facility]COPY;  Service: Endoscopy;  Laterality: N/A;  . EYE SURGERY    . HERNIA REPAIR      SOCIAL HISTORY:  Social History   Tobacco Use  . Smoking status: Current Every Day Smoker    Packs/day: 0.50    Years: 50.00    Pack years: 25.00  . Smokeless tobacco: Never Used  Substance Use Topics  . Alcohol use: Yes    Comment: rare - Holidays    FAMILY HISTORY:  Family History  Problem Relation Age of Onset  . Breast cancer Neg Hx     DRUG ALLERGIES:  Allergies  Allergen Reactions  . Bacitracin Rash    REVIEW OF SYSTEMS:   CONSTITUTIONAL: Positive fever, fatigue or weakness.  EYES: No blurred or double vision.  EARS, NOSE, AND THROAT: No tinnitus or ear pain.  RESPIRATORY: No cough, positive shortness of breath, no wheezing or hemoptysis.  CARDIOVASCULAR: No chest pain, orthopnea, edema.  GASTROINTESTINAL: No nausea, vomiting, diarrhea or abdominal pain.  GENITOURINARY: No dysuria, hematuria.  ENDOCRINE: No polyuria, nocturia,  HEMATOLOGY: No anemia, easy bruising or bleeding SKIN: No rash or lesion. MUSCULOSKELETAL: No joint pain or arthritis.  NEUROLOGIC: No tingling, numbness, weakness.  PSYCHIATRY: No anxiety or depression.   MEDICATIONS AT HOME:  Prior to Admission medications   Medication Sig Start Date End Date Taking? Authorizing Provider  albuterol (PROVENTIL HFA;VENTOLIN HFA) 108 (90 Base) MCG/ACT inhaler Inhale 2 puffs into the lungs every 6 (six) hours as needed for wheezing or shortness of breath. 05/04/16   Schuyler Amor, MD  Budesonide-Formoterol Fumarate (SYMBICORT IN) Inhale into the lungs 2 (two) times daily as needed.     [provider]  buPROPion (WELLBUTRIN XL) 150 MG 24 hr tablet Take 150 mg by mouth daily.    [provider]  cetirizine (ZYRTEC) 10 MG tablet Take 10 mg by mouth daily.    [provider]  fluticasone (FLONASE) 50 MCG/ACT nasal spray Place 2 sprays into both nostrils daily.    [provider]  furosemide (LASIX) 20 MG tablet Take 20 mg by mouth daily.    [provider]  gabapentin (NEURONTIN) 100 MG capsule Take 100 mg by mouth 3 (three) times daily.    [provider]  montelukast (SINGULAIR) 10 MG tablet Take 10 mg by mouth at bedtime.    [provider]  omeprazole (PRILOSEC) 40 MG capsule Take 40 mg by mouth daily.    [provider]  orphenadrine (NORFLEX) 100 MG tablet Take 1 tablet (100 mg total) by mouth 2 (two) times daily. 05/12/17   Sable Feil, PA-C  pramipexole (MIRAPEX) 0.5 MG tablet Take 0.5 mg by mouth 3 (three) times daily.    [provider]  rOPINIRole (REQUIP) 0.25 MG tablet Take 0.25 mg by mouth 3 (three) times daily.    [provider]  sertraline (ZOLOFT) 50 MG tablet Take 50 mg by mouth 2 (two) times daily. 50 mg AM, 25 mg PM    [provider]  tiZANidine (ZANAFLEX) 2 MG tablet Take 2 mg by mouth as needed for muscle spasms.    [provider]      PHYSICAL EXAMINATION:   VITAL SIGNS: Blood pressure 112/61, pulse (!) 114, temperature (!) 102.9 F (39.4 C), temperature source Oral, resp. rate 16, height 5\' 7"  (1.702 m), weight 83 kg, SpO2 (!) 87 %.  GENERAL:  72 y.o.-year-old patient lying in the bed with no acute distress.  EYES: Pupils equal, round, reactive to light and accommodation. No scleral icterus. Extraocular muscles intact.  HEENT: Head atraumatic, normocephalic. Oropharynx and nasopharynx clear.  NECK:  Supple, no jugular venous distention. No thyroid enlargement, no tenderness.  LUNGS: Rhonchus breath sounds bilaterally CARDIOVASCULAR: S1,  S2 normal. No murmurs, rubs, or gallops.  ABDOMEN: Soft, nontender, nondistended. Bowel sounds present. No organomegaly or mass.  EXTREMITIES: No pedal edema, cyanosis, or clubbing.  NEUROLOGIC: Cranial nerves II through XII are intact. Muscle strength 5/5 in all extremities. Sensation intact. Gait not checked.  PSYCHIATRIC: The patient is alert and oriented x 3.  SKIN: No obvious rash, lesion, or ulcer.   LABORATORY PANEL:   CBC Recent Labs  Lab 07/23/18 1128  WBC 14.5*  HGB 15.2  HCT 44.4  PLT 244  MCV 90.6  MCH 31.0  MCHC 34.2  RDW 14.1  LYMPHSABS 0.5*  MONOABS 0.1*  EOSABS 0.1  BASOSABS 0.0   ------------------------------------------------------------------------------------------------------------------  Chemistries  Recent Labs  Lab 07/23/18 1128  NA 140  K 4.5  CL 102  CO2 28  GLUCOSE 128*  BUN 16  CREATININE 0.99  CALCIUM 8.5*  AST 17  ALT 15  ALKPHOS  72  BILITOT 0.6   ------------------------------------------------------------------------------------------------------------------ estimated creatinine clearance is 57.8 mL/min (by C-G formula based on SCr of 0.99 mg/dL). ------------------------------------------------------------------------------------------------------------------ No results for input(s): TSH, T4TOTAL, T3FREE, THYROIDAB in the last 72 hours.  Invalid input(s): FREET3   Coagulation profile Recent Labs  Lab 07/23/18 1128  INR 0.90   ------------------------------------------------------------------------------------------------------------------- No results for input(s): DDIMER in the last 72 hours. -------------------------------------------------------------------------------------------------------------------  Cardiac Enzymes Recent Labs  Lab 07/23/18 1128  TROPONINI <0.03   ------------------------------------------------------------------------------------------------------------------ Invalid input(s):  POCBNP  ---------------------------------------------------------------------------------------------------------------  Urinalysis    Component Value Date/Time   COLORURINE YELLOW (A) 07/23/2018 1128   APPEARANCEUR CLEAR (A) 07/23/2018 1128   LABSPEC 1.014 07/23/2018 1128   PHURINE 6.0 07/23/2018 1128   GLUCOSEU NEGATIVE 07/23/2018 1128   HGBUR NEGATIVE 07/23/2018 1128   BILIRUBINUR NEGATIVE 07/23/2018 1128   KETONESUR NEGATIVE 07/23/2018 1128   PROTEINUR NEGATIVE 07/23/2018 1128   NITRITE NEGATIVE 07/23/2018 1128   LEUKOCYTESUR NEGATIVE 07/23/2018 1128     RADIOLOGY: Dg Chest Portable 1 View  Result Date: 07/23/2018 CLINICAL DATA:  Acute shortness of breath, fever and cough for 2 days. EXAM: PORTABLE CHEST 1 VIEW COMPARISON:  05/04/2016 chest CT, chest radiograph and prior studies FINDINGS: The cardiomediastinal silhouette is unremarkable. There is no evidence of focal airspace disease, pulmonary edema, suspicious pulmonary nodule/mass, pleural effusion, or pneumothorax. No acute bony abnormalities are identified. Little interval change since the prior study. IMPRESSION: No active disease. Electronically Signed   By: Margarette Canada M.D.   On: 07/23/2018 11:23    EKG: Orders placed or performed during the hospital encounter of 07/23/18  . ED EKG 12-Lead  . ED EKG 12-Lead  . EKG 12-Lead  . EKG 12-Lead    IMPRESSION AND PLAN: Patient 72 year old presenting with fever  1.  Fever with work-up so far being negative I will order a CT scan of the abdomen pelvis as well as chest to further evaluate Due to complaint of cough and shortness of breath we will treat for community-acquired pneumonia  2.  COPD without exasperation we will treat with nebulizers and inhalers as doing at home  3.  GERD continue PPI  4.  Seasonal allergies continue home regimen  5.  Nicotine abuse smoking cessation provided 4 minutes spent strongly recommend patient stop smoking she states that she is  unable to wear a nicotine patch will continue Wellbutrin All the records are reviewed and case discussed with ED provider. Management plans discussed with the patient, family and they are in agreement.  CODE STATUS: Advance Directive Documentation     Most Recent Value  Type of Advance Directive  Living will  Pre-existing out of facility DNR order (yellow form or pink MOST form)  -  "MOST" Form in Place?  -       TOTAL TIME TAKING CARE OF THIS PATIENT55 minutes.    Dustin Flock M.D on 07/23/2018 at 2:01 PM  Between 7am to 6pm - Pager - [phone]  After 6pm go to [email or web address] - password [email or web address]  [employer] Office  [phone]  CC: Primary care physician; Tracie Harrier, MD

## 2018-07-23 NOTE — ED Triage Notes (Signed)
Pt presents via EMS for SOB and fever starting this am. Reports seeing MD currectly for "nausea and hot flashes" per pt report. EMS reports temp 102.7. A&Ox4 at this time. Quale MD at bedside.

## 2018-07-23 NOTE — Progress Notes (Signed)
PHARMACY - PHYSICIAN COMMUNICATION CRITICAL VALUE ALERT - BLOOD CULTURE IDENTIFICATION (BCID)  Morgan Valdez is an 72 y.o. female who presented to [facility] on 07/23/2018 with a chief complaint of   Assessment:  3/4 E. coli KPC(-) (include suspected source if known)  Name of physician (or Provider) Contacted: Willis  Current antibiotics: ceftriaxone/azithromycin  Changes to prescribed antibiotics recommended:  Ceftriaxone changed to meropenem.  Results for orders placed or performed during the hospital encounter of 07/23/18  Blood Culture ID Panel (Reflexed) (Collected: 07/23/2018 11:28 AM)  Result Value Ref Range   Enterococcus species NOT DETECTED NOT DETECTED   Listeria monocytogenes NOT DETECTED NOT DETECTED   Staphylococcus species NOT DETECTED NOT DETECTED   Staphylococcus aureus NOT DETECTED NOT DETECTED   Streptococcus species NOT DETECTED NOT DETECTED   Streptococcus agalactiae NOT DETECTED NOT DETECTED   Streptococcus pneumoniae NOT DETECTED NOT DETECTED   Streptococcus pyogenes NOT DETECTED NOT DETECTED   Acinetobacter baumannii NOT DETECTED NOT DETECTED   Enterobacteriaceae species DETECTED (A) NOT DETECTED   Enterobacter cloacae complex NOT DETECTED NOT DETECTED   Escherichia coli DETECTED (A) NOT DETECTED   Klebsiella oxytoca NOT DETECTED NOT DETECTED   Klebsiella pneumoniae NOT DETECTED NOT DETECTED   Proteus species NOT DETECTED NOT DETECTED   Serratia marcescens NOT DETECTED NOT DETECTED   Carbapenem resistance NOT DETECTED NOT DETECTED   Haemophilus influenzae NOT DETECTED NOT DETECTED   Neisseria meningitidis NOT DETECTED NOT DETECTED   Pseudomonas aeruginosa NOT DETECTED NOT DETECTED   Candida albicans NOT DETECTED NOT DETECTED   Candida glabrata NOT DETECTED NOT DETECTED   Candida krusei NOT DETECTED NOT DETECTED   Candida parapsilosis NOT DETECTED NOT DETECTED   Candida tropicalis NOT DETECTED NOT DETECTED    Maureen Delatte S 07/23/2018  11:25 PM

## 2018-07-24 LAB — CBC
HCT: 40.4 % (ref 35.0–47.0)
Hemoglobin: 13.8 g/dL (ref 12.0–16.0)
MCH: 31 pg (ref 26.0–34.0)
MCHC: 34.2 g/dL (ref 32.0–36.0)
MCV: 90.7 fL (ref 80.0–100.0)
Platelets: 233 10*3/uL (ref 150–440)
RBC: 4.45 MIL/uL (ref 3.80–5.20)
RDW: 14.5 % (ref 11.5–14.5)
WBC: 18.6 10*3/uL — ABNORMAL HIGH (ref 3.6–11.0)

## 2018-07-24 LAB — BASIC METABOLIC PANEL
Anion gap: 9 (ref 5–15)
BUN: 15 mg/dL (ref 8–23)
CO2: 24 mmol/L (ref 22–32)
Calcium: 7.9 mg/dL — ABNORMAL LOW (ref 8.9–10.3)
Chloride: 105 mmol/L (ref 98–111)
Creatinine, Ser: 1.25 mg/dL — ABNORMAL HIGH (ref 0.44–1.00)
GFR calc Af Amer: 49 mL/min — ABNORMAL LOW (ref >60)
GFR calc non Af Amer: 42 mL/min — ABNORMAL LOW (ref >60)
Glucose, Bld: 187 mg/dL — ABNORMAL HIGH (ref 70–99)
Potassium: 3.9 mmol/L (ref 3.5–5.1)
Sodium: 138 mmol/L (ref 135–145)

## 2018-07-24 LAB — LACTIC ACID, PLASMA
Lactic Acid, Venous: 1.8 mmol/L (ref 0.5–1.9)
Lactic Acid, Venous: 1.5 mmol/L (ref 0.5–1.9)

## 2018-07-24 MED ORDER — PANTOPRAZOLE SODIUM 40 MG PO TBEC
40.0000 mg | DELAYED_RELEASE_TABLET | Freq: Every day | ORAL | Status: DC
  Administered 2018-07-24 – 2018-07-25 (×2): 40 mg via ORAL
  Filled 2018-07-24: qty 1

## 2018-07-24 MED ORDER — SODIUM CHLORIDE 0.9 % IV SOLN
2.0000 g | INTRAVENOUS | Status: DC
  Administered 2018-07-24: 13:00:00 2 g via INTRAVENOUS
  Filled 2018-07-24: qty 2
  Filled 2018-07-24: qty 20

## 2018-07-24 MED ORDER — PRAMIPEXOLE DIHYDROCHLORIDE 0.25 MG PO TABS
0.5000 mg | ORAL_TABLET | Freq: Three times a day (TID) | ORAL | Status: DC
  Administered 2018-07-24 – 2018-07-25 (×5): 0.5 mg via ORAL
  Filled 2018-07-24 (×5): qty 2

## 2018-07-24 MED ORDER — IPRATROPIUM BROMIDE 0.02 % IN SOLN: 0.5000 mg | Freq: Four times a day (QID) | RESPIRATORY_TRACT | Status: DC | PRN

## 2018-07-24 MED ORDER — SODIUM CHLORIDE 0.9 % IV SOLN
1.0000 g | Freq: Two times a day (BID) | INTRAVENOUS | Status: DC
  Filled 2018-07-24 (×2): qty 1

## 2018-07-24 MED ORDER — INFLUENZA VAC SPLIT HIGH-DOSE 0.5 ML IM SUSY
0.5000 mL | PREFILLED_SYRINGE | INTRAMUSCULAR | Status: DC
  Filled 2018-07-24: qty 0.5

## 2018-07-24 MED ORDER — LEVALBUTEROL HCL 0.63 MG/3ML IN NEBU
0.6300 mg | INHALATION_SOLUTION | Freq: Three times a day (TID) | RESPIRATORY_TRACT | Status: DC
  Administered 2018-07-24 – 2018-07-25 (×4): 0.63 mg via RESPIRATORY_TRACT
  Filled 2018-07-24 (×4): qty 3

## 2018-07-24 MED ORDER — SODIUM CHLORIDE 0.9 % IV SOLN
INTRAVENOUS | Status: DC
  Administered 2018-07-24: 01:00:00 via INTRAVENOUS

## 2018-07-24 NOTE — Progress Notes (Signed)
[place] [facility] [place] NAME: Morgan Valdez    MR#:  [id number]  DATE OF BIRTH:  1946/01/16  SUBJECTIVE:  CHIEF COMPLAINT:   Chief Complaint  Patient presents with  . Fever  . Shortness of Breath   -Is much better today.  No fevers today.  Blood cultures positive for E. coli  REVIEW OF SYSTEMS:  Review of Systems  Constitutional: Positive for chills. Negative for fever.       Hot flashes  HENT: Negative for congestion, ear discharge, hearing loss and nosebleeds.   Eyes: Negative for blurred vision and double vision.  Respiratory: Negative for cough, shortness of breath and wheezing.   Cardiovascular: Negative for chest pain and palpitations.  Gastrointestinal: Positive for nausea. Negative for abdominal pain, constipation, diarrhea and vomiting.  Genitourinary: Negative for dysuria.  Neurological: Negative for dizziness, focal weakness, seizures, weakness and headaches.  Psychiatric/Behavioral: Negative for depression.    DRUG ALLERGIES:   Allergies  Allergen Reactions  . Bacitracin Rash    VITALS:  Blood pressure (!) 109/58, pulse 80, temperature 97.8 F (36.6 C), temperature source Oral, resp. rate 18, height 5\' 7"  (1.702 m), weight 83 kg, SpO2 98 %.  PHYSICAL EXAMINATION:  Physical Exam  GENERAL:  72 y.o.-year-old patient lying in the bed with no acute distress.  EYES: Pupils equal, round, reactive to light and accommodation. No scleral icterus. Extraocular muscles intact.  HEENT: Head atraumatic, normocephalic. Oropharynx and nasopharynx clear.  NECK:  Supple, no jugular venous distention. No thyroid enlargement, no tenderness.  LUNGS: Normal breath sounds bilaterally, no wheezing, rales,rhonchi or crepitation. No use of accessory muscles of respiration.  CARDIOVASCULAR: S1, S2 normal. No murmurs, rubs, or gallops.  ABDOMEN: Soft, nontender, nondistended. Bowel sounds present. No organomegaly or mass.  EXTREMITIES: No  pedal edema, cyanosis, or clubbing.  NEUROLOGIC: Cranial nerves II through XII are intact. Muscle strength 5/5 in all extremities. Sensation intact. Gait not checked.  PSYCHIATRIC: The patient is alert and oriented x 3.  SKIN: No obvious rash, lesion, or ulcer.    LABORATORY PANEL:   CBC Recent Labs  Lab 07/24/18 0100  WBC 18.6*  HGB 13.8  HCT 40.4  PLT 233   ------------------------------------------------------------------------------------------------------------------  Chemistries  Recent Labs  Lab 07/23/18 1128  07/24/18 0100  NA 140  --  138  K 4.5  --  3.9  CL 102  --  105  CO2 28  --  24  GLUCOSE 128*  --  187*  BUN 16  --  15  CREATININE 0.99   < > 1.25*  CALCIUM 8.5*  --  7.9*  AST 17  --   --   ALT 15  --   --   ALKPHOS 72  --   --   BILITOT 0.6  --   --    < > = values in this interval not displayed.   ------------------------------------------------------------------------------------------------------------------  Cardiac Enzymes Recent Labs  Lab 07/23/18 1128  TROPONINI <0.03   ------------------------------------------------------------------------------------------------------------------  RADIOLOGY:  Ct Chest W Contrast  Result Date: 07/23/2018 CLINICAL DATA:  Fever and shortness of breath. Leukocytosis. Prior cholecystectomy and appendectomy. EXAM: CT CHEST, ABDOMEN, AND PELVIS WITH CONTRAST TECHNIQUE: Multidetector CT imaging of the chest, abdomen and pelvis was performed following the standard protocol during bolus administration of intravenous contrast. CONTRAST:  154mL ISOVUE-300 IOPAMIDOL (ISOVUE-300) INJECTION 61% COMPARISON:  CT chest 05/04/2016 FINDINGS: CT CHEST FINDINGS Cardiovascular: The heart size is normal. No substantial pericardial effusion.  Mediastinum/Nodes: No mediastinal lymphadenopathy. There is no hilar lymphadenopathy. The esophagus has normal imaging features. There is no axillary lymphadenopathy.Multinodular left thyroid  gland again noted. 17 mm component projecting posteriorly into the mediastinum with 16 mm on the prior study. Lungs/Pleura: The central tracheobronchial airways are patent. Centrilobular and paraseptal emphysema noted. 3 mm right middle lobe nodule (92/4) is unchanged consistent with benign etiology. There is some atelectasis or scarring in the right middle lobe. 4 mm posterior right lower lobe nodule (113/4) is also stable. Stable chronic atelectasis or scar in the lingula. No focal airspace consolidation. No edema or pleural effusion. Musculoskeletal: No worrisome lytic or sclerotic osseous abnormality. CT ABDOMEN PELVIS FINDINGS Hepatobiliary: No focal abnormality within the liver parenchyma. Gallbladder surgically absent. Mild prominence extrahepatic common duct similar to prior chest CT and likely related to cholecystectomy. Pancreas: No focal mass lesion. No dilatation of the main duct. No intraparenchymal cyst. No peripancreatic edema. Spleen: No splenomegaly. No focal mass lesion. Adrenals/Urinary Tract: No adrenal nodule or mass. Central sinus cysts are noted in the kidneys bilaterally. No evidence for hydroureter. The urinary bladder appears normal for the degree of distention. Stomach/Bowel: Stomach is nondistended. No gastric wall thickening. No evidence of outlet obstruction. Duodenum is normally positioned as is the ligament of Treitz. No small bowel wall thickening. No small bowel dilatation. The terminal ileum is normal. Nonvisualization of the appendix is consistent with the reported history of appendectomy. Diverticular changes are noted in the left colon without evidence of diverticulitis. Vascular/Lymphatic: There is abdominal aortic atherosclerosis without aneurysm. There is no gastrohepatic or hepatoduodenal ligament lymphadenopathy. No intraperitoneal or retroperitoneal lymphadenopathy. No pelvic sidewall lymphadenopathy. Reproductive: Uterus unremarkable.  There is no adnexal mass. Other: No  intraperitoneal free fluid. Musculoskeletal: No worrisome lytic or sclerotic osseous abnormality. IMPRESSION: 1. No acute findings in the chest, abdomen, or pelvis. No findings to explain the patient's history of nausea and leukocytosis. 2. Tiny right lung nodules stable since 05/04/2016 consistent with benign process. 3. Similar appearance multinodular thyroid gland. 4.  Aortic Atherosclerois (ICD10-170.0) 5.  Emphysema. ([id number]-Y50.9) Electronically Signed   By: Misty Stanley M.D.   On: 07/23/2018 16:57   Ct Abdomen Pelvis W Contrast  Result Date: 07/23/2018 CLINICAL DATA:  Fever and shortness of breath. Leukocytosis. Prior cholecystectomy and appendectomy. EXAM: CT CHEST, ABDOMEN, AND PELVIS WITH CONTRAST TECHNIQUE: Multidetector CT imaging of the chest, abdomen and pelvis was performed following the standard protocol during bolus administration of intravenous contrast. CONTRAST:  197mL ISOVUE-300 IOPAMIDOL (ISOVUE-300) INJECTION 61% COMPARISON:  CT chest 05/04/2016 FINDINGS: CT CHEST FINDINGS Cardiovascular: The heart size is normal. No substantial pericardial effusion. Mediastinum/Nodes: No mediastinal lymphadenopathy. There is no hilar lymphadenopathy. The esophagus has normal imaging features. There is no axillary lymphadenopathy.Multinodular left thyroid gland again noted. 17 mm component projecting posteriorly into the mediastinum with 16 mm on the prior study. Lungs/Pleura: The central tracheobronchial airways are patent. Centrilobular and paraseptal emphysema noted. 3 mm right middle lobe nodule (92/4) is unchanged consistent with benign etiology. There is some atelectasis or scarring in the right middle lobe. 4 mm posterior right lower lobe nodule (113/4) is also stable. Stable chronic atelectasis or scar in the lingula. No focal airspace consolidation. No edema or pleural effusion. Musculoskeletal: No worrisome lytic or sclerotic osseous abnormality. CT ABDOMEN PELVIS FINDINGS Hepatobiliary: No focal  abnormality within the liver parenchyma. Gallbladder surgically absent. Mild prominence extrahepatic common duct similar to prior chest CT and likely related to cholecystectomy. Pancreas: No focal mass lesion. No dilatation  of the main duct. No intraparenchymal cyst. No peripancreatic edema. Spleen: No splenomegaly. No focal mass lesion. Adrenals/Urinary Tract: No adrenal nodule or mass. Central sinus cysts are noted in the kidneys bilaterally. No evidence for hydroureter. The urinary bladder appears normal for the degree of distention. Stomach/Bowel: Stomach is nondistended. No gastric wall thickening. No evidence of outlet obstruction. Duodenum is normally positioned as is the ligament of Treitz. No small bowel wall thickening. No small bowel dilatation. The terminal ileum is normal. Nonvisualization of the appendix is consistent with the reported history of appendectomy. Diverticular changes are noted in the left colon without evidence of diverticulitis. Vascular/Lymphatic: There is abdominal aortic atherosclerosis without aneurysm. There is no gastrohepatic or hepatoduodenal ligament lymphadenopathy. No intraperitoneal or retroperitoneal lymphadenopathy. No pelvic sidewall lymphadenopathy. Reproductive: Uterus unremarkable.  There is no adnexal mass. Other: No intraperitoneal free fluid. Musculoskeletal: No worrisome lytic or sclerotic osseous abnormality. IMPRESSION: 1. No acute findings in the chest, abdomen, or pelvis. No findings to explain the patient's history of nausea and leukocytosis. 2. Tiny right lung nodules stable since 05/04/2016 consistent with benign process. 3. Similar appearance multinodular thyroid gland. 4.  Aortic Atherosclerois (ICD10-170.0) 5.  Emphysema. ([id number]) Electronically Signed   By: Misty Stanley M.D.   On: 07/23/2018 16:57   Dg Chest Portable 1 View  Result Date: 07/23/2018 CLINICAL DATA:  Acute shortness of breath, fever and cough for 2 days. EXAM: PORTABLE CHEST 1 VIEW  COMPARISON:  05/04/2016 chest CT, chest radiograph and prior studies FINDINGS: The cardiomediastinal silhouette is unremarkable. There is no evidence of focal airspace disease, pulmonary edema, suspicious pulmonary nodule/mass, pleural effusion, or pneumothorax. No acute bony abnormalities are identified. Little interval change since the prior study. IMPRESSION: No active disease. Electronically Signed   By: Margarette Canada M.D.   On: 07/23/2018 11:23    EKG:   Orders placed or performed during the hospital encounter of 07/23/18  . ED EKG 12-Lead  . ED EKG 12-Lead  . EKG 12-Lead  . EKG 12-Lead  . EKG    ASSESSMENT AND PLAN:   72 year old female with past medical history significant for Barrett's esophagus, COPD, depression and GERD presents to hospital secondary to nausea and hot flashes  1.  Sepsis secondary to bacteremia-chills at home, now with E. coli bacteremia -Could be urinary source or GI source -Change antibiotics to Rocephin -wbc is  improving -Continue IV fluids  2.  COPD-stable no acute exacerbation.  Continue home inhalers.  Not on any oxygen support  3.  Depression-continue Wellbutrin and Zoloft  4.  GERD-Protonix  5.  DVT prophylaxis-Lovenox     All the records are reviewed and case discussed with Care Management/Social Workerr. Management plans discussed with the patient, family and they are in agreement.  CODE STATUS: Full Code  TOTAL TIME TAKING CARE OF THIS PATIENT: 38 minutes.   POSSIBLE D/C IN 1-2 DAYS, DEPENDING ON CLINICAL CONDITION.   Gladstone Lighter M.D on 07/24/2018 at 2:39 PM  Between 7am to 6pm - Pager - [phone]  After 6pm go to [email or web address] - password [email or web address]  Office  [phone]  CC: Primary care physician; Tracie Harrier, MD

## 2018-07-24 NOTE — Progress Notes (Signed)
PHARMACY NOTE -  ANTIBIOTIC RENAL DOSE ADJUSTMENT   Request received for Pharmacy to assist with antibiotic renal dose adjustment.  Patient has been initiated on meropenem for E. Coli bacteremia. SCr 1.25 mg/dL, estimated CrCl 45 ml/min Dose has been modified to meropenem 1 gm IV Q12H per protocol.  Maxen Rowland A. Harrell, [place].D., BCPS Clinical Pharmacist 07/24/18 08:19

## 2018-07-25 LAB — CBC
HCT: 39.1 % (ref 35.0–47.0)
Hemoglobin: 13.9 g/dL (ref 12.0–16.0)
MCH: 31.8 pg (ref 26.0–34.0)
MCHC: 35.6 g/dL (ref 32.0–36.0)
MCV: 89.1 fL (ref 80.0–100.0)
Platelets: 198 10*3/uL (ref 150–440)
RBC: 4.39 MIL/uL (ref 3.80–5.20)
RDW: 14.7 % — ABNORMAL HIGH (ref 11.5–14.5)
WBC: 9.4 10*3/uL (ref 3.6–11.0)

## 2018-07-25 LAB — BASIC METABOLIC PANEL
Anion gap: 5 (ref 5–15)
BUN: 8 mg/dL (ref 8–23)
CO2: 26 mmol/L (ref 22–32)
Calcium: 8 mg/dL — ABNORMAL LOW (ref 8.9–10.3)
Chloride: 108 mmol/L (ref 98–111)
Creatinine, Ser: 0.85 mg/dL (ref 0.44–1.00)
GFR calc Af Amer: >60 mL/min (ref >60)
GFR calc non Af Amer: >60 mL/min (ref >60)
Glucose, Bld: 130 mg/dL — ABNORMAL HIGH (ref 70–99)
Potassium: 3.6 mmol/L (ref 3.5–5.1)
Sodium: 139 mmol/L (ref 135–145)

## 2018-07-25 MED ORDER — CEPHALEXIN 500 MG PO CAPS
500.0000 mg | ORAL_CAPSULE | Freq: Four times a day (QID) | ORAL | Status: DC
  Administered 2018-07-25 (×2): 500 mg via ORAL
  Filled 2018-07-25 (×2): qty 1

## 2018-07-25 MED ORDER — CEPHALEXIN 500 MG PO CAPS: 500.0000 mg | ORAL_CAPSULE | Freq: Four times a day (QID) | ORAL | 0 refills | Status: DC

## 2018-07-25 MED ORDER — PROMETHAZINE HCL 25 MG/ML IJ SOLN
12.5000 mg | Freq: Four times a day (QID) | INTRAMUSCULAR | Status: DC | PRN
  Administered 2018-07-25: 12.5 mg via INTRAVENOUS
  Filled 2018-07-25: qty 1

## 2018-07-25 MED ORDER — PROMETHAZINE HCL 25 MG PO TABS: 25.0000 mg | ORAL_TABLET | Freq: Four times a day (QID) | ORAL | 0 refills | Status: DC | PRN

## 2018-07-25 MED ORDER — LOPERAMIDE HCL 2 MG PO CAPS
2.0000 mg | ORAL_CAPSULE | Freq: Four times a day (QID) | ORAL | Status: DC | PRN
  Administered 2018-07-25: 2 mg via ORAL
  Filled 2018-07-25: qty 1

## 2018-07-25 NOTE — Care Management (Signed)
No discharge needs identified by members of the care team 

## 2018-07-26 LAB — CULTURE, BLOOD (ROUTINE X 2)
Special Requests: ADEQUATE
Special Requests: ADEQUATE

## 2018-07-26 NOTE — Care Management (Signed)
Received call back from Ms. Widener. States her pharmacists would,'t fill the phenergan prescription  with out pre-authorization from physician Discussed that she had just had a prescription filled 3 weeks ago for an antiemetic. This is  the reason the pharmacist would fill the need prescription again. States she understood. Shelbie Ammons RN MSN CCM Care Management [phone]

## 2018-07-28 NOTE — Discharge Summary (Signed)
[facility] NAME: Morgan Valdez    MR#:  [id number]  DATE OF BIRTH:  [birth year]  DATE OF ADMISSION:  07/23/2018   ADMITTING PHYSICIAN: Dustin Flock, MD  DATE OF DISCHARGE: 07/25/2018  4:11 PM  PRIMARY CARE PHYSICIAN: Tracie Harrier, MD   ADMISSION DIAGNOSIS:   Bronchitis [J40] Sepsis, due to unspecified organism [A41.9] Pneumonia of right lower lobe due to infectious organism ([place]) [J18.1]  DISCHARGE DIAGNOSIS:   Active Problems:   Fever   SECONDARY DIAGNOSIS:   Past Medical History:  Diagnosis Date  . Allergic state   . Arthritis   . Barrett's esophagus   . Chronic cough   . COPD (chronic obstructive pulmonary disease) ([place])   . Depression   . Duodenitis   . Esophageal motility disorder   . Gastritis   . GERD (gastroesophageal reflux disease)   . Headache    migraines - 3-4x/yr  . Tobacco abuse disorder   . Varicose veins of legs     HOSPITAL COURSE:   72 year old female with past medical history significant for Barrett's esophagus, COPD, depression and GERD presents to hospital secondary to nausea and hot flashes  1.  Sepsis secondary to bacteremia-chills at home, now with E. coli bacteremia -Could be urinary source or GI source -WBC is improving with IV Rocephin. -Discharged on Keflex  2.  COPD-stable no acute exacerbation.  Continue home inhalers.  Not on any oxygen support  3.  Depression-continue Wellbutrin and Zoloft  4.  GERD-on Nexium  Otherwise stable, up and ambulatory.  Will be discharged home today  DISCHARGE CONDITIONS:   Guarded  CONSULTS OBTAINED:   None  DRUG ALLERGIES:   Allergies  Allergen Reactions  . Bacitracin Rash   DISCHARGE MEDICATIONS:   Allergies as of 07/25/2018      Reactions   Bacitracin Rash      Medication List    STOP taking these medications   orphenadrine 100 MG tablet Commonly known as:  NORFLEX     TAKE these medications    albuterol 108 (90 Base) MCG/ACT inhaler Commonly known as:  PROVENTIL HFA;VENTOLIN HFA Inhale 2 puffs into the lungs every 6 (six) hours as needed for wheezing or shortness of breath.   buPROPion 150 MG 12 hr tablet Commonly known as:  WELLBUTRIN SR Take 150 mg by mouth 2 (two) times daily.   cephALEXin 500 MG capsule Commonly known as:  KEFLEX Take 1 capsule (500 mg total) by mouth every 6 (six) hours.   cetirizine 10 MG tablet Commonly known as:  ZYRTEC Take 10 mg by mouth at bedtime.   esomeprazole 20 MG capsule Commonly known as:  NEXIUM Take 20 mg by mouth at bedtime.   fluticasone 50 MCG/ACT nasal spray Commonly known as:  FLONASE Place 2 sprays into both nostrils daily.   furosemide 20 MG tablet Commonly known as:  LASIX Take 20 mg by mouth as needed for fluid.   ondansetron 4 MG tablet Commonly known as:  ZOFRAN Take 4 mg by mouth 2 (two) times daily as needed for nausea/vomiting.   pramipexole 0.5 MG tablet Commonly known as:  MIRAPEX Take 0.5 mg by mouth 3 (three) times daily.   promethazine 25 MG tablet Commonly known as:  PHENERGAN Take 1 tablet (25 mg total) by mouth every 6 (six) hours as needed for nausea or vomiting.   tiZANidine 2 MG tablet Commonly known as:  ZANAFLEX Take 2 mg by mouth as  needed for muscle spasms.        DISCHARGE INSTRUCTIONS:   1.  PCP follow-up in 1 to 2 weeks  DIET:   Cardiac diet  ACTIVITY:   Activity as tolerated  OXYGEN:   Home Oxygen: No.  Oxygen Delivery: room air  DISCHARGE LOCATION:   home   If you experience worsening of your admission symptoms, develop shortness of breath, life threatening emergency, suicidal or homicidal thoughts you must seek medical attention immediately by calling 911 or calling your MD immediately  if symptoms less severe.  You Must read complete instructions/literature along with all the possible adverse reactions/side effects for all the Medicines you take and that have  been prescribed to you. Take any new Medicines after you have completely understood and accpet all the possible adverse reactions/side effects.   Please note  You were cared for by a hospitalist during your hospital stay. If you have any questions about your discharge medications or the care you received while you were in the hospital after you are discharged, you can call the unit and asked to speak with the hospitalist on call if the hospitalist that took care of you is not available. Once you are discharged, your primary care physician will handle any further medical issues. Please note that NO REFILLS for any discharge medications will be authorized once you are discharged, as it is imperative that you return to your primary care physician (or establish a relationship with a primary care physician if you do not have one) for your aftercare needs so that they can reassess your need for medications and monitor your lab values.    On the day of Discharge:  VITAL SIGNS:   Blood pressure (!) 118/56, pulse (!) 104, temperature 98.2 F (36.8 C), temperature source Oral, resp. rate 18, height 5\' 7"  (1.702 m), weight 83 kg, SpO2 93 %.  PHYSICAL EXAMINATION:    GENERAL:  72 y.o.-year-old patient lying in the bed with no acute distress.  EYES: Pupils equal, round, reactive to light and accommodation. No scleral icterus. Extraocular muscles intact.  HEENT: Head atraumatic, normocephalic. Oropharynx and nasopharynx clear.  NECK:  Supple, no jugular venous distention. No thyroid enlargement, no tenderness.  LUNGS: Normal breath sounds bilaterally, no wheezing, rales,rhonchi or crepitation. No use of accessory muscles of respiration.  CARDIOVASCULAR: S1, S2 normal. No murmurs, rubs, or gallops.  ABDOMEN: Soft, nontender, nondistended. Bowel sounds present. No organomegaly or mass.  EXTREMITIES: No pedal edema, cyanosis, or clubbing.  NEUROLOGIC: Cranial nerves II through XII are intact. Muscle strength  5/5 in all extremities. Sensation intact. Gait not checked.  PSYCHIATRIC: The patient is alert and oriented x 3.  SKIN: No obvious rash, lesion, or ulcer.    DATA REVIEW:   CBC Recent Labs  Lab 07/25/18 0417  WBC 9.4  HGB 13.9  HCT 39.1  PLT 198    Chemistries  Recent Labs  Lab 07/23/18 1128  07/25/18 0417  NA 140   < > 139  K 4.5   < > 3.6  CL 102   < > 108  CO2 28   < > 26  GLUCOSE 128*   < > 130*  BUN 16   < > 8  CREATININE 0.99   < > 0.85  CALCIUM 8.5*   < > 8.0*  AST 17  --   --   ALT 15  --   --   ALKPHOS 72  --   --   BILITOT 0.6  --   --    < > =  values in this interval not displayed.     Microbiology Results  Results for orders placed or performed during the hospital encounter of 07/23/18  Blood Culture (routine x 2)     Status: Abnormal   Collection Time: 07/23/18 11:00 AM  Result Value Ref Range Status   Specimen Description   Final    BLOOD RIGHT ANTECUBITAL Performed at [facility], [place] [address], [place], [place] [address]    Special Requests   Final    BOTTLES DRAWN AEROBIC AND ANAEROBIC Blood Culture adequate volume Performed at [facility], [address] [place], [place] [address]    Culture  Setup Time   Final    GRAM NEGATIVE RODS AEROBIC BOTTLE ONLY CRITICAL VALUE NOTED.  VALUE IS CONSISTENT WITH PREVIOUSLY REPORTED AND CALLED VALUE. Performed at [facility], [address] [place], [place] [address]    Culture ESCHERICHIA COLI (A)  Final   Report Status 07/26/2018 FINAL  Final   Organism ID, Bacteria ESCHERICHIA COLI  Final      Susceptibility   Escherichia coli - MIC*    AMPICILLIN <=2 SENSITIVE Sensitive     CEFAZOLIN <=4 SENSITIVE Sensitive     CEFEPIME <=1 SENSITIVE Sensitive     CEFTAZIDIME <=1 SENSITIVE Sensitive     CEFTRIAXONE <=1 SENSITIVE Sensitive     CIPROFLOXACIN <=0.25 SENSITIVE Sensitive     GENTAMICIN <=1 SENSITIVE Sensitive     IMIPENEM <=0.25 SENSITIVE Sensitive      TRIMETH/SULFA <=20 SENSITIVE Sensitive     AMPICILLIN/SULBACTAM <=2 SENSITIVE Sensitive     PIP/TAZO <=4 SENSITIVE Sensitive     Extended ESBL NEGATIVE Sensitive     * ESCHERICHIA COLI  Blood Culture (routine x 2)     Status: Abnormal   Collection Time: 07/23/18 11:28 AM  Result Value Ref Range Status   Specimen Description   Final    BLOOD LEFT FOREARM Performed at [facility], [address], [place], [place] [address]    Special Requests   Final    BOTTLES DRAWN AEROBIC AND ANAEROBIC Blood Culture adequate volume Performed at [facility], [address] [place], [place] [address]    Culture  Setup Time   Final    GRAM NEGATIVE RODS IN BOTH AEROBIC AND ANAEROBIC BOTTLES CRITICAL RESULT CALLED TO, READ BACK BY AND VERIFIED WITH: MATT MCBANE ON 07/23/18 AT 2[address] [facility]    Culture (A)  Final    ESCHERICHIA COLI SUSCEPTIBILITIES PERFORMED ON PREVIOUS CULTURE WITHIN THE LAST 5 DAYS. Performed at [facility], [place] [address], [place], [place] [address]    Report Status 07/26/2018 FINAL  Final  Blood Culture ID Panel (Reflexed)     Status: Abnormal   Collection Time: 07/23/18 11:28 AM  Result Value Ref Range Status   Enterococcus species NOT DETECTED NOT DETECTED Final   Listeria monocytogenes NOT DETECTED NOT DETECTED Final   Staphylococcus species NOT DETECTED NOT DETECTED Final   Staphylococcus aureus NOT DETECTED NOT DETECTED Final   Streptococcus species NOT DETECTED NOT DETECTED Final   Streptococcus agalactiae NOT DETECTED NOT DETECTED Final   Streptococcus pneumoniae NOT DETECTED NOT DETECTED Final   Streptococcus pyogenes NOT DETECTED NOT DETECTED Final   Acinetobacter baumannii NOT DETECTED NOT DETECTED Final   Enterobacteriaceae species DETECTED (A) NOT DETECTED Final    Comment: Enterobacteriaceae represent a large family of gram-negative bacteria, not a single organism. CRITICAL RESULT CALLED TO, READ BACK BY AND VERIFIED WITH:  MATT MCBANE ON  07/23/18 AT 2239 [facility]    Enterobacter cloacae complex NOT DETECTED NOT DETECTED Final   Escherichia coli DETECTED (A) NOT DETECTED Final    Comment: CRITICAL RESULT CALLED TO, READ BACK BY AND VERIFIED WITH: MATT MCBANE ON 07/23/18 AT 2239 [facility]    Klebsiella oxytoca NOT DETECTED NOT DETECTED Final   Klebsiella pneumoniae NOT DETECTED NOT DETECTED Final   Proteus species NOT DETECTED NOT DETECTED Final   Serratia marcescens NOT DETECTED NOT DETECTED Final   Carbapenem resistance NOT DETECTED NOT DETECTED Final   Haemophilus influenzae NOT DETECTED NOT DETECTED Final   Neisseria meningitidis NOT DETECTED NOT DETECTED Final   Pseudomonas aeruginosa NOT DETECTED NOT DETECTED Final   Candida albicans NOT DETECTED NOT DETECTED Final   Candida glabrata NOT DETECTED NOT DETECTED Final   Candida krusei NOT DETECTED NOT DETECTED Final   Candida parapsilosis NOT DETECTED NOT DETECTED Final   Candida tropicalis NOT DETECTED NOT DETECTED Final    Comment: Performed at [facility], [address], [place], [place] [address]    RADIOLOGY:  No results found.   Management plans discussed with the patient, family and they are in agreement.  CODE STATUS:  Code Status History    Date Active Date Inactive Code Status Order ID Comments User Context   07/23/2018 1416 07/25/2018 1916 Full Code [id number]  Dustin Flock, MD ED    Advance Directive Documentation     Most Recent Value  Type of Advance Directive  Healthcare Power of Attorney, Living will  Pre-existing out of facility DNR order (yellow form or pink MOST form)  -  "MOST" Form in Place?  -      TOTAL TIME TAKING CARE OF THIS PATIENT: 38 minutes.    Gladstone Lighter M.D on 07/28/2018 at 4:26 PM  Between 7am to 6pm - Pager - [phone]  After 6pm go to [email or web address] - Proofreader  [facility]  Office  [phone]  CC: Primary care physician; Tracie Harrier, MD   Note: This  dictation was prepared with Dragon dictation along with smaller phrase technology. Any transcriptional errors that result from this process are unintentional.

## 2018-08-01 ENCOUNTER — Ambulatory Visit: Payer: Medicare HMO

## 2018-08-02 DIAGNOSIS — R110 Nausea: Secondary | ICD-10-CM | POA: Diagnosis not present

## 2018-08-02 DIAGNOSIS — D72829 Elevated white blood cell count, unspecified: Secondary | ICD-10-CM | POA: Diagnosis not present

## 2018-08-08 DIAGNOSIS — K22719 Barrett's esophagus with dysplasia, unspecified: Secondary | ICD-10-CM | POA: Diagnosis not present

## 2018-08-08 DIAGNOSIS — Z720 Tobacco use: Secondary | ICD-10-CM | POA: Diagnosis not present

## 2018-08-08 DIAGNOSIS — A4151 Sepsis due to Escherichia coli [E. coli]: Secondary | ICD-10-CM | POA: Diagnosis not present

## 2018-08-08 DIAGNOSIS — F339 Major depressive disorder, recurrent, unspecified: Secondary | ICD-10-CM | POA: Diagnosis not present

## 2018-08-08 DIAGNOSIS — G2581 Restless legs syndrome: Secondary | ICD-10-CM | POA: Diagnosis not present

## 2018-08-08 DIAGNOSIS — Z09 Encounter for follow-up examination after completed treatment for conditions other than malignant neoplasm: Secondary | ICD-10-CM | POA: Diagnosis not present

## 2018-08-16 DIAGNOSIS — M5136 Other intervertebral disc degeneration, lumbar region: Secondary | ICD-10-CM | POA: Diagnosis not present

## 2018-08-16 DIAGNOSIS — M5416 Radiculopathy, lumbar region: Secondary | ICD-10-CM | POA: Diagnosis not present

## 2018-08-16 DIAGNOSIS — M48062 Spinal stenosis, lumbar region with neurogenic claudication: Secondary | ICD-10-CM | POA: Diagnosis not present

## 2018-08-30 DIAGNOSIS — K5909 Other constipation: Secondary | ICD-10-CM | POA: Diagnosis not present

## 2018-08-30 DIAGNOSIS — R110 Nausea: Secondary | ICD-10-CM | POA: Diagnosis not present

## 2018-08-30 DIAGNOSIS — K219 Gastro-esophageal reflux disease without esophagitis: Secondary | ICD-10-CM | POA: Diagnosis not present

## 2018-09-26 ENCOUNTER — Encounter: Payer: Self-pay | Admitting: *Deleted

## 2018-09-26 ENCOUNTER — Encounter
Admission: RE | Disposition: A | Discharge status: Home / Self Care | Payer: Self-pay | Source: Ambulatory Visit | Attending: Gastroenterology

## 2018-09-26 ENCOUNTER — Ambulatory Visit
Admission: RE | Admit: 2018-09-26 | Discharge: 2018-09-26 | Disposition: A | Discharge status: Home / Self Care | Payer: Medicare HMO | Source: Ambulatory Visit | Attending: Gastroenterology | Admitting: Gastroenterology

## 2018-09-26 ENCOUNTER — Ambulatory Visit: Payer: Medicare HMO | Admitting: Anesthesiology

## 2018-09-26 DIAGNOSIS — K29 Acute 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: Secondary | ICD-10-CM | POA: Diagnosis not present

## 2018-09-26 DIAGNOSIS — K2270 Barrett's esophagus without dysplasia: Secondary | ICD-10-CM | POA: Diagnosis not present

## 2018-09-26 DIAGNOSIS — K2970 Gastritis, unspecified, without bleeding: Secondary | ICD-10-CM | POA: Insufficient documentation

## 2018-09-26 DIAGNOSIS — K219 Gastro-esophageal reflux disease without esophagitis: Secondary | ICD-10-CM | POA: Diagnosis not present

## 2018-09-26 DIAGNOSIS — K449 Diaphragmatic hernia without obstruction or gangrene: Secondary | ICD-10-CM | POA: Diagnosis not present

## 2018-09-26 DIAGNOSIS — K224 Dyskinesia of esophagus: Secondary | ICD-10-CM | POA: Diagnosis not present

## 2018-09-26 DIAGNOSIS — K228 Other specified diseases of esophagus: Secondary | ICD-10-CM | POA: Diagnosis not present

## 2018-09-26 DIAGNOSIS — M1990 Unspecified osteoarthritis, unspecified site: Secondary | ICD-10-CM | POA: Diagnosis not present

## 2018-09-26 DIAGNOSIS — K5900 Constipation, unspecified: Secondary | ICD-10-CM | POA: Insufficient documentation

## 2018-09-26 DIAGNOSIS — J392 Other diseases of pharynx: Secondary | ICD-10-CM | POA: Diagnosis not present

## 2018-09-26 DIAGNOSIS — R110 Nausea: Secondary | ICD-10-CM | POA: Diagnosis not present

## 2018-09-26 DIAGNOSIS — F17200 Nicotine dependence, unspecified, uncomplicated: Secondary | ICD-10-CM | POA: Diagnosis not present

## 2018-09-26 DIAGNOSIS — F329 Major depressive disorder, single episode, unspecified: Secondary | ICD-10-CM | POA: Diagnosis not present

## 2018-09-26 DIAGNOSIS — K210 Gastro-esophageal reflux disease with esophagitis: Secondary | ICD-10-CM | POA: Diagnosis not present

## 2018-09-26 DIAGNOSIS — K3189 Other diseases of stomach and duodenum: Secondary | ICD-10-CM | POA: Diagnosis not present

## 2018-09-26 DIAGNOSIS — K319 Disease of stomach and duodenum, unspecified: Secondary | ICD-10-CM | POA: Insufficient documentation

## 2018-09-26 DIAGNOSIS — Z79899 Other long term (current) drug therapy: Secondary | ICD-10-CM | POA: Diagnosis not present

## 2018-09-26 DIAGNOSIS — I739 Peripheral vascular disease, unspecified: Secondary | ICD-10-CM | POA: Diagnosis not present

## 2018-09-26 DIAGNOSIS — J449 Chronic obstructive pulmonary disease, unspecified: Secondary | ICD-10-CM | POA: Diagnosis not present

## 2018-09-26 HISTORY — PX: ESOPHAGOGASTRODUODENOSCOPY: SHX5428

## 2018-09-26 SURGERY — EGD (ESOPHAGOGASTRODUODENOSCOPY)
Anesthesia: General

## 2018-09-26 MED ORDER — SODIUM CHLORIDE 0.9 % IV SOLN
INTRAVENOUS | Status: DC
  Administered 2018-09-26: 1000 mL via INTRAVENOUS

## 2018-09-26 MED ORDER — FENTANYL CITRATE (PF) 100 MCG/2ML IJ SOLN
INTRAMUSCULAR | Status: DC | PRN
  Administered 2018-09-26 (×2): 50 ug via INTRAVENOUS

## 2018-09-26 MED ORDER — FENTANYL CITRATE (PF) 100 MCG/2ML IJ SOLN
INTRAMUSCULAR | Status: AC
  Filled 2018-09-26: qty 2

## 2018-09-26 MED ORDER — PROPOFOL 500 MG/50ML IV EMUL
INTRAVENOUS | Status: AC
  Filled 2018-09-26: qty 50

## 2018-09-26 MED ORDER — PROPOFOL 500 MG/50ML IV EMUL
INTRAVENOUS | Status: DC | PRN
  Administered 2018-09-26: 150 ug/kg/min via INTRAVENOUS

## 2018-09-26 MED ORDER — PROPOFOL 10 MG/ML IV BOLUS
INTRAVENOUS | Status: DC | PRN
  Administered 2018-09-26: 100 mg via INTRAVENOUS

## 2018-09-26 MED ORDER — LIDOCAINE 2% (20 MG/ML) 5 ML SYRINGE
INTRAMUSCULAR | Status: DC | PRN
  Administered 2018-09-26: 30 mg via INTRAVENOUS

## 2018-09-26 MED ORDER — LIDOCAINE HCL (PF) 1 % IJ SOLN
INTRAMUSCULAR | Status: AC
  Administered 2018-09-26: 0.3 mL
  Filled 2018-09-26: qty 2

## 2018-09-26 MED ORDER — PHENYLEPHRINE HCL 10 MG/ML IJ SOLN
INTRAMUSCULAR | Status: DC | PRN
  Administered 2018-09-26: 100 ug via INTRAVENOUS

## 2018-09-26 NOTE — Anesthesia Preprocedure Evaluation (Signed)
Anesthesia Evaluation  Patient identified by MRN, date of birth, ID band Patient awake    Reviewed: Allergy & Precautions, H&P , NPO status , Patient's Chart, lab work & pertinent test results  History of Anesthesia Complications Negative for: history of anesthetic complications  Airway Mallampati: III  TM Distance: <3 FB Neck ROM: limited    Dental  (+) Chipped, Poor Dentition   Pulmonary shortness of breath and with exertion, COPD,  COPD inhaler, neg recent URI, Current Smoker,           Cardiovascular Exercise Tolerance: Good (-) hypertension(-) angina+ Peripheral Vascular Disease  (-) Past MI (-) dysrhythmias      Neuro/Psych  Headaches, neg Seizures PSYCHIATRIC DISORDERS Depression    GI/Hepatic Neg liver ROS, GERD  Medicated and Controlled,  Endo/Other  negative endocrine ROS  Renal/GU negative Renal ROS  negative genitourinary   Musculoskeletal   Abdominal   Peds  Hematology negative hematology ROS (+)   Anesthesia Other Findings Past Medical History: No date: Allergic state No date: Arthritis No date: Barrett's esophagus No date: Chronic cough No date: COPD (chronic obstructive pulmonary disease) ([facility]) No date: Depression No date: Duodenitis No date: Esophageal motility disorder No date: Gastritis No date: GERD (gastroesophageal reflux disease) No date: Headache     Comment:  migraines - 3-4x/yr No date: Tobacco abuse disorder No date: Varicose veins of legs  Past Surgical History: No date: APPENDECTOMY 01/03/2017: CATARACT EXTRACTION W/PHACO; Right     Comment:  Procedure: CATARACT EXTRACTION PHACO AND INTRAOCULAR               LENS PLACEMENT ([place])  Right;  Surgeon: Ronnell Freshwater, MD;  Location: [facility];  Service:              Ophthalmology;  Laterality: Right; 01/17/2017: CATARACT EXTRACTION W/PHACO; Left     Comment:  Procedure: CATARACT EXTRACTION PHACO AND  INTRAOCULAR               LENS PLACEMENT ([facility]);  Surgeon: Ronnell Freshwater,              MD;  Location: [facility];  Service:               Ophthalmology;  Laterality: Left;  left No date: CHOLECYSTECTOMY 10/14/2015: ESOPHAGOGASTRODUODENOSCOPY (EGD) WITH PROPOFOL; N/A     Comment:  Procedure: ESOPHAGOGASTRODUODENOSCOPY (EGD) WITH               PROPOFOL;  Surgeon: Lollie Sails, MD;  Location:               [facility] ENDOSCOPY;  Service: Endoscopy;  Laterality: N/A; No date: EYE SURGERY No date: HERNIA REPAIR  BMI    Body Mass Index:  29.29 kg/m      Reproductive/Obstetrics negative OB ROS                             Anesthesia Physical  Anesthesia Plan  ASA: III  Anesthesia Plan: General   Post-op Pain Management:    Induction: Intravenous  PONV Risk Score and Plan: 3 and Propofol infusion and TIVA  Airway Management Planned: Natural Airway and Nasal Cannula  Additional Equipment:   Intra-op Plan:   Post-operative Plan:   Informed Consent: I have reviewed the patients History and Physical, chart, labs and discussed the procedure including the  risks, benefits and alternatives for the proposed anesthesia with the patient or authorized representative who has indicated his/her understanding and acceptance.   Dental Advisory Given  Plan Discussed with: Anesthesiologist, CRNA and Surgeon  Anesthesia Plan Comments: (Patient consented for risks of anesthesia including but not limited to:  - adverse reactions to medications - risk of intubation if required - damage to teeth, lips or other oral mucosa - sore throat or hoarseness - Damage to heart, brain, lungs or loss of life  Patient voiced understanding.)        Anesthesia Quick Evaluation

## 2018-09-26 NOTE — Anesthesia Post-op Follow-up Note (Signed)
Anesthesia QCDR form completed.        

## 2018-09-26 NOTE — Anesthesia Postprocedure Evaluation (Signed)
Anesthesia Post Note  Patient: Morgan Valdez  Procedure(s) Performed: ESOPHAGOGASTRODUODENOSCOPY (EGD) (N/A )  Patient location during evaluation: Endoscopy Anesthesia Type: General Level of consciousness: awake and alert Pain management: pain level controlled Vital Signs Assessment: post-procedure vital signs reviewed and stable Respiratory status: spontaneous breathing, nonlabored ventilation, respiratory function stable and patient connected to nasal cannula oxygen Cardiovascular status: blood pressure returned to baseline and stable Postop Assessment: no apparent nausea or vomiting Anesthetic complications: no     Last Vitals:  Vitals:   09/26/18 1244 09/26/18 1245  BP: (!) 89/51   Pulse: 87 87  Resp:  14  Temp: (!) 36.3 C (!) 36.3 C  SpO2: 95% 94%    Last Pain:  Vitals:   09/26/18 1304  TempSrc:   PainSc: 0-No pain                 Yaira Bernardi S

## 2018-09-26 NOTE — Op Note (Signed)
[facility] Gastroenterology Patient Name: Morgan Valdez Procedure Date: 09/26/2018 11:40 AM MRN: [id number] Account #: [id number] Date of Birth: 1946/06/21 Admit Type: Outpatient Age: 72 Room: [facility] ENDO ROOM 3 Gender: Female Note Status: Finalized Procedure:            Upper GI endoscopy Indications:          Nausea Providers:            Lollie Sails, MD Referring MD:         Tracie Harrier, MD (Referring MD) Medicines:            Monitored Anesthesia Care Complications:        No immediate complications. Procedure:            Pre-Anesthesia Assessment:                       - ASA Grade Assessment: III - A patient with severe                        systemic disease.                       After obtaining informed consent, the endoscope was                        passed under direct vision. Throughout the procedure,                        the patient's blood pressure, pulse, and oxygen                        saturations were monitored continuously. The Endoscope                        was introduced through the mouth, and advanced to the                        third part of duodenum. The patient tolerated the                        procedure well. The upper GI endoscopy was accomplished                        without difficulty. Findings:      The Z-line was irregular. Biopsies were taken with a cold forceps for       histology.      Abnormal motility was noted in the lower third of the esophagus. The       cricopharyngeus was normal. There are extra peristaltic waves in the       esophageal body. Tertiary peristaltic waves are noted.      Patchy mild inflammation characterized by congestion (edema) and       erythema was found in the gastric antrum. Biopsies were taken with a       cold forceps for histology.      A small hiatal hernia was present.      The cardia and gastric fundus were normal on retroflexion otherwise.      The examined duodenum was  normal. Biopsies were taken with a cold       forceps for histology.      Localized mild  mucosal changes characterized by altered texture were       found in the lower third of the esophagus at about 34 cm from the       incisors. Biopsies/removal of this localized spot were taken with a cold       forceps for histology. Impression:           - Z-line irregular. Biopsied.                       - Abnormal esophageal motility.                       - Gastritis. Biopsied.                       - Small hiatal hernia.                       - Normal examined duodenum. Biopsied.                       - Texture changed mucosa in the esophagus. Biopsied. Recommendation:       - Discharge patient to home.                       - Use Aciphex (rabeprazole) 20 mg PO BID for 2 weeks.                       - Use Aciphex (rabeprazole) 20 mg PO daily daily. Procedure Code(s):    --- Professional ---                       [phone], Esophagogastroduodenoscopy, flexible, transoral;                        with biopsy, single or multiple Diagnosis Code(s):    --- Professional ---                       K22.8, Other specified diseases of esophagus                       K22.4, Dyskinesia of esophagus                       K29.70, Gastritis, unspecified, without bleeding                       K44.9, Diaphragmatic hernia without obstruction or                        gangrene                       R11.0, Nausea CPT copyright 2018 [employer]. All rights reserved. The codes documented in this report are preliminary and upon coder review may  be revised to meet current compliance requirements. Lollie Sails, MD 09/26/2018 12:45:00 PM This report has been signed electronically. Number of Addenda: 0 Note Initiated On: 09/26/2018 11:40 AM      [facility]

## 2018-09-26 NOTE — Transfer of Care (Signed)
Immediate Anesthesia Transfer of Care Note  Patient: Elzora L Nokes  Procedure(s) Performed: ESOPHAGOGASTRODUODENOSCOPY (EGD) (N/A )  Patient Location: PACU and Endoscopy Unit  Anesthesia Type:General  Level of Consciousness: drowsy  Airway & Oxygen Therapy: Patient Spontanous Breathing and Patient connected to nasal cannula oxygen  Post-op Assessment: Report given to RN and Post -op Vital signs reviewed and stable  Post vital signs: Reviewed and stable  Last Vitals:  Vitals Value Taken Time  BP    Temp    Pulse    Resp 15 09/26/2018 12:43 PM  SpO2    Vitals shown include unvalidated device data.  Last Pain:  Vitals:   09/26/18 1129  TempSrc: Tympanic  PainSc: 0-No pain         Complications: No apparent anesthesia complications

## 2018-09-26 NOTE — H&P (Signed)
Outpatient short stay form Pre-procedure 09/26/2018 12:09 PM Morgan Sails MD  Primary Physician: Dr Tracie Harrier  Reason for visit: EGD  History of present illness: Patient is a 72 year old female presenting today for an EGD.  She has been having problems with increasing amounts of nausea for several weeks.  She had a hospitalization in late September for a sepsis of undetermined etiology.  At that time she has had problems with nausea mostly in the morning.  There is no emesis.  There is no abdominal pain.  Has had increasing problems with constipation for at least several weeks.  She did have a colonoscopy on 04/06/2018 that showed multiple colon polyps and diverticulosis.    Current Facility-Administered Medications:  .  0.9 %  sodium chloride infusion, , Intravenous, Continuous, Morgan Sails, MD, Last Rate: 20 mL/hr at 09/26/18 1209, 1,000 mL at 09/26/18 1209  Medications Prior to Admission  Medication Sig Dispense Refill Last Dose  . albuterol (PROVENTIL HFA;VENTOLIN HFA) 108 (90 Base) MCG/ACT inhaler Inhale 2 puffs into the lungs every 6 (six) hours as needed for wheezing or shortness of breath. 1 Inhaler 2 09/25/2018 at Unknown time  . buPROPion (WELLBUTRIN SR) 150 MG 12 hr tablet Take 150 mg by mouth 2 (two) times daily.   09/25/2018 at Unknown time  . furosemide (LASIX) 20 MG tablet Take 20 mg by mouth as needed for fluid.    09/25/2018 at Unknown time  . ondansetron (ZOFRAN) 4 MG tablet Take 4 mg by mouth 2 (two) times daily as needed for nausea/vomiting.  0 Past Week at Unknown time  . sucralfate (CARAFATE) 1 g tablet Take 1 g by mouth 4 (four) times daily -  with meals and at bedtime.   Past Week at Unknown time  . cephALEXin (KEFLEX) 500 MG capsule Take 1 capsule (500 mg total) by mouth every 6 (six) hours. (Patient not taking: Reported on 09/26/2018) 12 capsule 0 Completed Course at Unknown time  . cetirizine (ZYRTEC) 10 MG tablet Take 10 mg by mouth at bedtime.     07/22/2018 at Unknown time  . fluticasone (FLONASE) 50 MCG/ACT nasal spray Place 2 sprays into both nostrils daily.    PRN at PRN  . pramipexole (MIRAPEX) 0.5 MG tablet Take 0.5 mg by mouth 3 (three) times daily.   07/22/2018 at Unknown time  . promethazine (PHENERGAN) 25 MG tablet Take 1 tablet (25 mg total) by mouth every 6 (six) hours as needed for nausea or vomiting. 30 tablet 0   . tiZANidine (ZANAFLEX) 2 MG tablet Take 2 mg by mouth as needed for muscle spasms.   07/22/2018 at Unknown time     Allergies  Allergen Reactions  . Bacitracin Rash     Past Medical History:  Diagnosis Date  . Allergic state   . Arthritis   . Barrett's esophagus   . Chronic cough   . COPD (chronic obstructive pulmonary disease) ([place])   . Depression   . Duodenitis   . Esophageal motility disorder   . Gastritis   . GERD (gastroesophageal reflux disease)   . Headache    migraines - 3-4x/yr  . Tobacco abuse disorder   . Varicose veins of legs     Review of systems:      Physical Exam    Heart and lungs: Regular rate and rhythm without rub or gallop, lungs are bilaterally clear.    HEENT: Normocephalic atraumatic eyes are anicteric    Other:    Pertinant  exam for procedure: Soft, mild discomfort palpation left lower quadrant.  There are no masses or rebound.    Planned proceedures: EGD and indicated procedures. I have discussed the risks benefits and complications of procedures to include not limited to bleeding, infection, perforation and the risk of sedation and the patient wishes to proceed.    Morgan Sails, MD Gastroenterology 09/26/2018  12:09 PM

## 2018-09-27 ENCOUNTER — Encounter: Payer: Self-pay | Admitting: Gastroenterology

## 2018-10-02 DIAGNOSIS — R1032 Left lower quadrant pain: Secondary | ICD-10-CM | POA: Diagnosis not present

## 2018-10-02 DIAGNOSIS — R109 Unspecified abdominal pain: Secondary | ICD-10-CM | POA: Diagnosis not present

## 2018-10-02 LAB — SURGICAL PATHOLOGY

## 2018-10-23 DIAGNOSIS — Z1329 Encounter for screening for other suspected endocrine disorder: Secondary | ICD-10-CM | POA: Diagnosis not present

## 2018-10-23 DIAGNOSIS — Z0000 Encounter for general adult medical examination without abnormal findings: Secondary | ICD-10-CM | POA: Diagnosis not present

## 2018-10-23 DIAGNOSIS — Z131 Encounter for screening for diabetes mellitus: Secondary | ICD-10-CM | POA: Diagnosis not present

## 2018-10-23 DIAGNOSIS — G2581 Restless legs syndrome: Secondary | ICD-10-CM | POA: Diagnosis not present

## 2018-10-23 DIAGNOSIS — F3289 Other specified depressive episodes: Secondary | ICD-10-CM | POA: Diagnosis not present

## 2018-10-23 DIAGNOSIS — K219 Gastro-esophageal reflux disease without esophagitis: Secondary | ICD-10-CM | POA: Diagnosis not present

## 2018-10-23 DIAGNOSIS — Z13220 Encounter for screening for lipoid disorders: Secondary | ICD-10-CM | POA: Diagnosis not present

## 2018-10-23 DIAGNOSIS — R739 Hyperglycemia, unspecified: Secondary | ICD-10-CM | POA: Diagnosis not present

## 2018-10-23 DIAGNOSIS — K5909 Other constipation: Secondary | ICD-10-CM | POA: Diagnosis not present

## 2018-10-23 DIAGNOSIS — Z720 Tobacco use: Secondary | ICD-10-CM | POA: Diagnosis not present

## 2018-10-23 DIAGNOSIS — I10 Essential (primary) hypertension: Secondary | ICD-10-CM | POA: Diagnosis not present

## 2018-10-30 DIAGNOSIS — R7309 Other abnormal glucose: Secondary | ICD-10-CM | POA: Diagnosis not present

## 2018-10-30 DIAGNOSIS — G8929 Other chronic pain: Secondary | ICD-10-CM | POA: Diagnosis not present

## 2018-10-30 DIAGNOSIS — R195 Other fecal abnormalities: Secondary | ICD-10-CM | POA: Diagnosis not present

## 2018-10-30 DIAGNOSIS — Z720 Tobacco use: Secondary | ICD-10-CM | POA: Diagnosis not present

## 2018-10-30 DIAGNOSIS — M545 Low back pain: Secondary | ICD-10-CM | POA: Diagnosis not present

## 2018-10-30 DIAGNOSIS — M48062 Spinal stenosis, lumbar region with neurogenic claudication: Secondary | ICD-10-CM | POA: Diagnosis not present

## 2018-10-30 DIAGNOSIS — F325 Major depressive disorder, single episode, in full remission: Secondary | ICD-10-CM | POA: Diagnosis not present

## 2019-04-03 DIAGNOSIS — M4312 Spondylolisthesis, cervical region: Secondary | ICD-10-CM | POA: Diagnosis not present

## 2019-04-03 DIAGNOSIS — M25512 Pain in left shoulder: Secondary | ICD-10-CM | POA: Diagnosis not present

## 2019-04-03 DIAGNOSIS — M47812 Spondylosis without myelopathy or radiculopathy, cervical region: Secondary | ICD-10-CM | POA: Diagnosis not present

## 2019-04-03 DIAGNOSIS — M542 Cervicalgia: Secondary | ICD-10-CM | POA: Diagnosis not present

## 2019-04-19 DIAGNOSIS — M545 Low back pain: Secondary | ICD-10-CM | POA: Diagnosis not present

## 2019-04-19 DIAGNOSIS — M48062 Spinal stenosis, lumbar region with neurogenic claudication: Secondary | ICD-10-CM | POA: Diagnosis not present

## 2019-04-19 DIAGNOSIS — G8929 Other chronic pain: Secondary | ICD-10-CM | POA: Diagnosis not present

## 2019-04-19 DIAGNOSIS — R7309 Other abnormal glucose: Secondary | ICD-10-CM | POA: Diagnosis not present

## 2019-04-19 DIAGNOSIS — R195 Other fecal abnormalities: Secondary | ICD-10-CM | POA: Diagnosis not present

## 2019-04-19 DIAGNOSIS — F325 Major depressive disorder, single episode, in full remission: Secondary | ICD-10-CM | POA: Diagnosis not present

## 2019-04-19 DIAGNOSIS — E785 Hyperlipidemia, unspecified: Secondary | ICD-10-CM | POA: Diagnosis not present

## 2019-04-19 DIAGNOSIS — Z720 Tobacco use: Secondary | ICD-10-CM | POA: Diagnosis not present

## 2019-04-26 DIAGNOSIS — Z0000 Encounter for general adult medical examination without abnormal findings: Secondary | ICD-10-CM | POA: Diagnosis not present

## 2019-04-26 DIAGNOSIS — Z720 Tobacco use: Secondary | ICD-10-CM | POA: Diagnosis not present

## 2019-04-26 DIAGNOSIS — G2581 Restless legs syndrome: Secondary | ICD-10-CM | POA: Diagnosis not present

## 2019-04-26 DIAGNOSIS — F331 Major depressive disorder, recurrent, moderate: Secondary | ICD-10-CM | POA: Diagnosis not present

## 2019-04-26 DIAGNOSIS — F325 Major depressive disorder, single episode, in full remission: Secondary | ICD-10-CM | POA: Diagnosis not present

## 2019-04-26 DIAGNOSIS — M545 Low back pain: Secondary | ICD-10-CM | POA: Diagnosis not present

## 2019-04-26 DIAGNOSIS — R195 Other fecal abnormalities: Secondary | ICD-10-CM | POA: Diagnosis not present

## 2019-04-26 DIAGNOSIS — J439 Emphysema, unspecified: Secondary | ICD-10-CM | POA: Diagnosis not present

## 2019-04-26 DIAGNOSIS — M48062 Spinal stenosis, lumbar region with neurogenic claudication: Secondary | ICD-10-CM | POA: Diagnosis not present

## 2019-04-26 DIAGNOSIS — K22719 Barrett's esophagus with dysplasia, unspecified: Secondary | ICD-10-CM | POA: Diagnosis not present

## 2019-08-24 DIAGNOSIS — R7309 Other abnormal glucose: Secondary | ICD-10-CM | POA: Diagnosis not present

## 2019-08-24 DIAGNOSIS — Z720 Tobacco use: Secondary | ICD-10-CM | POA: Diagnosis not present

## 2019-08-24 DIAGNOSIS — Z0000 Encounter for general adult medical examination without abnormal findings: Secondary | ICD-10-CM | POA: Diagnosis not present

## 2019-08-24 DIAGNOSIS — F331 Major depressive disorder, recurrent, moderate: Secondary | ICD-10-CM | POA: Diagnosis not present

## 2019-08-24 DIAGNOSIS — E785 Hyperlipidemia, unspecified: Secondary | ICD-10-CM | POA: Diagnosis not present

## 2019-08-24 DIAGNOSIS — G2581 Restless legs syndrome: Secondary | ICD-10-CM | POA: Diagnosis not present

## 2019-08-24 DIAGNOSIS — K22719 Barrett's esophagus with dysplasia, unspecified: Secondary | ICD-10-CM | POA: Diagnosis not present

## 2019-08-24 DIAGNOSIS — M48062 Spinal stenosis, lumbar region with neurogenic claudication: Secondary | ICD-10-CM | POA: Diagnosis not present

## 2019-08-31 ENCOUNTER — Other Ambulatory Visit: Payer: Self-pay | Admitting: Internal Medicine

## 2019-08-31 DIAGNOSIS — K2100 Gastro-esophageal reflux disease with esophagitis, without bleeding: Secondary | ICD-10-CM | POA: Diagnosis not present

## 2019-08-31 DIAGNOSIS — Z0000 Encounter for general adult medical examination without abnormal findings: Secondary | ICD-10-CM | POA: Diagnosis not present

## 2019-08-31 DIAGNOSIS — M545 Low back pain: Secondary | ICD-10-CM | POA: Diagnosis not present

## 2019-08-31 DIAGNOSIS — F329 Major depressive disorder, single episode, unspecified: Secondary | ICD-10-CM | POA: Diagnosis not present

## 2019-08-31 DIAGNOSIS — Z1231 Encounter for screening mammogram for malignant neoplasm of breast: Secondary | ICD-10-CM

## 2019-08-31 DIAGNOSIS — K2970 Gastritis, unspecified, without bleeding: Secondary | ICD-10-CM | POA: Diagnosis not present

## 2019-08-31 DIAGNOSIS — G8929 Other chronic pain: Secondary | ICD-10-CM | POA: Diagnosis not present

## 2019-08-31 DIAGNOSIS — G2581 Restless legs syndrome: Secondary | ICD-10-CM | POA: Diagnosis not present

## 2019-08-31 DIAGNOSIS — E785 Hyperlipidemia, unspecified: Secondary | ICD-10-CM | POA: Diagnosis not present

## 2019-08-31 DIAGNOSIS — F419 Anxiety disorder, unspecified: Secondary | ICD-10-CM | POA: Diagnosis not present

## 2019-09-24 DIAGNOSIS — Z86010 Personal history of colonic polyps: Secondary | ICD-10-CM | POA: Diagnosis not present

## 2019-09-24 DIAGNOSIS — K2270 Barrett's esophagus without dysplasia: Secondary | ICD-10-CM | POA: Diagnosis not present

## 2019-09-24 DIAGNOSIS — K5909 Other constipation: Secondary | ICD-10-CM | POA: Diagnosis not present

## 2019-09-24 DIAGNOSIS — R110 Nausea: Secondary | ICD-10-CM | POA: Diagnosis not present

## 2019-09-24 DIAGNOSIS — Z01812 Encounter for preprocedural laboratory examination: Secondary | ICD-10-CM | POA: Diagnosis not present

## 2019-09-24 DIAGNOSIS — K2100 Gastro-esophageal reflux disease with esophagitis, without bleeding: Secondary | ICD-10-CM | POA: Diagnosis not present

## 2019-09-24 DIAGNOSIS — K224 Dyskinesia of esophagus: Secondary | ICD-10-CM | POA: Diagnosis not present

## 2019-10-18 DIAGNOSIS — L03031 Cellulitis of right toe: Secondary | ICD-10-CM | POA: Diagnosis not present

## 2019-11-08 DIAGNOSIS — Z01812 Encounter for preprocedural laboratory examination: Secondary | ICD-10-CM | POA: Diagnosis not present

## 2019-11-13 DIAGNOSIS — G4733 Obstructive sleep apnea (adult) (pediatric): Secondary | ICD-10-CM | POA: Diagnosis not present

## 2019-11-13 DIAGNOSIS — K2270 Barrett's esophagus without dysplasia: Secondary | ICD-10-CM | POA: Diagnosis not present

## 2019-11-13 DIAGNOSIS — R110 Nausea: Secondary | ICD-10-CM | POA: Diagnosis not present

## 2019-11-13 DIAGNOSIS — K2980 Duodenitis without bleeding: Secondary | ICD-10-CM | POA: Diagnosis not present

## 2019-11-13 DIAGNOSIS — K222 Esophageal obstruction: Secondary | ICD-10-CM | POA: Diagnosis not present

## 2019-11-13 DIAGNOSIS — K449 Diaphragmatic hernia without obstruction or gangrene: Secondary | ICD-10-CM | POA: Diagnosis not present

## 2019-11-13 DIAGNOSIS — E119 Type 2 diabetes mellitus without complications: Secondary | ICD-10-CM | POA: Diagnosis not present

## 2019-11-13 DIAGNOSIS — K3189 Other diseases of stomach and duodenum: Secondary | ICD-10-CM | POA: Diagnosis not present

## 2019-11-13 DIAGNOSIS — K219 Gastro-esophageal reflux disease without esophagitis: Secondary | ICD-10-CM | POA: Diagnosis not present

## 2019-11-13 DIAGNOSIS — K2100 Gastro-esophageal reflux disease with esophagitis, without bleeding: Secondary | ICD-10-CM | POA: Diagnosis not present

## 2019-11-13 DIAGNOSIS — K295 Unspecified chronic 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: Secondary | ICD-10-CM | POA: Diagnosis not present

## 2019-11-13 DIAGNOSIS — K2970 Gastritis, unspecified, without bleeding: Secondary | ICD-10-CM | POA: Diagnosis not present

## 2019-12-27 DIAGNOSIS — R739 Hyperglycemia, unspecified: Secondary | ICD-10-CM | POA: Diagnosis not present

## 2019-12-27 DIAGNOSIS — F3340 Major depressive disorder, recurrent, in remission, unspecified: Secondary | ICD-10-CM | POA: Diagnosis not present

## 2019-12-27 DIAGNOSIS — G2581 Restless legs syndrome: Secondary | ICD-10-CM | POA: Diagnosis not present

## 2019-12-27 DIAGNOSIS — Z79899 Other long term (current) drug therapy: Secondary | ICD-10-CM | POA: Diagnosis not present

## 2019-12-27 DIAGNOSIS — Z1231 Encounter for screening mammogram for malignant neoplasm of breast: Secondary | ICD-10-CM | POA: Diagnosis not present

## 2019-12-27 DIAGNOSIS — K2970 Gastritis, unspecified, without bleeding: Secondary | ICD-10-CM | POA: Diagnosis not present

## 2019-12-27 DIAGNOSIS — M545 Low back pain: Secondary | ICD-10-CM | POA: Diagnosis not present

## 2019-12-27 DIAGNOSIS — K219 Gastro-esophageal reflux disease without esophagitis: Secondary | ICD-10-CM | POA: Diagnosis not present

## 2020-01-03 DIAGNOSIS — F329 Major depressive disorder, single episode, unspecified: Secondary | ICD-10-CM | POA: Diagnosis not present

## 2020-01-03 DIAGNOSIS — M48062 Spinal stenosis, lumbar region with neurogenic claudication: Secondary | ICD-10-CM | POA: Diagnosis not present

## 2020-01-03 DIAGNOSIS — G8929 Other chronic pain: Secondary | ICD-10-CM | POA: Diagnosis not present

## 2020-01-03 DIAGNOSIS — Z0000 Encounter for general adult medical examination without abnormal findings: Secondary | ICD-10-CM | POA: Diagnosis not present

## 2020-01-03 DIAGNOSIS — R7309 Other abnormal glucose: Secondary | ICD-10-CM | POA: Diagnosis not present

## 2020-01-03 DIAGNOSIS — J449 Chronic obstructive pulmonary disease, unspecified: Secondary | ICD-10-CM | POA: Diagnosis not present

## 2020-01-03 DIAGNOSIS — G2581 Restless legs syndrome: Secondary | ICD-10-CM | POA: Diagnosis not present

## 2020-01-03 DIAGNOSIS — K222 Esophageal obstruction: Secondary | ICD-10-CM | POA: Diagnosis not present

## 2020-02-27 DIAGNOSIS — K529 Noninfective gastroenteritis and colitis, unspecified: Secondary | ICD-10-CM | POA: Diagnosis not present

## 2020-02-27 DIAGNOSIS — A084 Viral intestinal infection, unspecified: Secondary | ICD-10-CM | POA: Diagnosis not present

## 2020-02-27 DIAGNOSIS — R1031 Right lower quadrant pain: Secondary | ICD-10-CM | POA: Diagnosis not present

## 2020-02-27 DIAGNOSIS — K580 Irritable bowel syndrome with diarrhea: Secondary | ICD-10-CM | POA: Diagnosis not present

## 2020-02-27 DIAGNOSIS — R1032 Left lower quadrant pain: Secondary | ICD-10-CM | POA: Diagnosis not present

## 2020-02-27 DIAGNOSIS — R112 Nausea with vomiting, unspecified: Secondary | ICD-10-CM | POA: Diagnosis not present

## 2020-02-27 DIAGNOSIS — R197 Diarrhea, unspecified: Secondary | ICD-10-CM | POA: Diagnosis not present

## 2020-02-29 DIAGNOSIS — R112 Nausea with vomiting, unspecified: Secondary | ICD-10-CM | POA: Diagnosis not present

## 2020-02-29 DIAGNOSIS — R1032 Left lower quadrant pain: Secondary | ICD-10-CM | POA: Diagnosis not present

## 2020-02-29 DIAGNOSIS — R197 Diarrhea, unspecified: Secondary | ICD-10-CM | POA: Diagnosis not present

## 2020-02-29 DIAGNOSIS — R1031 Right lower quadrant pain: Secondary | ICD-10-CM | POA: Diagnosis not present

## 2020-03-02 ENCOUNTER — Emergency Department
Admission: EM | Admit: 2020-03-02 | Discharge: 2020-03-02 | Disposition: A | Discharge status: Home / Self Care | Payer: Medicare HMO | Attending: Student | Admitting: Student

## 2020-03-02 ENCOUNTER — Other Ambulatory Visit: Payer: Self-pay

## 2020-03-02 DIAGNOSIS — J449 Chronic obstructive pulmonary disease, unspecified: Secondary | ICD-10-CM | POA: Insufficient documentation

## 2020-03-02 DIAGNOSIS — Z79899 Other long term (current) drug therapy: Secondary | ICD-10-CM | POA: Insufficient documentation

## 2020-03-02 DIAGNOSIS — F17210 Nicotine dependence, cigarettes, uncomplicated: Secondary | ICD-10-CM | POA: Insufficient documentation

## 2020-03-02 DIAGNOSIS — K5901 Slow transit constipation: Secondary | ICD-10-CM | POA: Insufficient documentation

## 2020-03-02 DIAGNOSIS — K5900 Constipation, unspecified: Secondary | ICD-10-CM | POA: Diagnosis present

## 2020-03-02 LAB — URINALYSIS, COMPLETE (UACMP) WITH MICROSCOPIC
Bacteria, UA: NONE SEEN
Bilirubin Urine: NEGATIVE
Glucose, UA: NEGATIVE mg/dL
Hgb urine dipstick: NEGATIVE
Ketones, ur: 5 mg/dL — AB
Nitrite: NEGATIVE
Protein, ur: NEGATIVE mg/dL
RBC / HPF: NONE SEEN RBC/hpf (ref 0–5)
Specific Gravity, Urine: 1.013 (ref 1.005–1.030)
pH: 5 (ref 5.0–8.0)

## 2020-03-02 LAB — CBC
HCT: 46.9 % — ABNORMAL HIGH (ref 36.0–46.0)
Hemoglobin: 15.7 g/dL — ABNORMAL HIGH (ref 12.0–15.0)
MCH: 30.9 pg (ref 26.0–34.0)
MCHC: 33.5 g/dL (ref 30.0–36.0)
MCV: 92.3 fL (ref 80.0–100.0)
Platelets: 249 10*3/uL (ref 150–400)
RBC: 5.08 MIL/uL (ref 3.87–5.11)
RDW: 13.2 % (ref 11.5–15.5)
WBC: 8.5 10*3/uL (ref 4.0–10.5)
nRBC: 0 % (ref 0.0–0.2)

## 2020-03-02 LAB — COMPREHENSIVE METABOLIC PANEL
ALT: 12 U/L (ref 0–44)
AST: 13 U/L — ABNORMAL LOW (ref 15–41)
Albumin: 3.9 g/dL (ref 3.5–5.0)
Alkaline Phosphatase: 61 U/L (ref 38–126)
Anion gap: 10 (ref 5–15)
BUN: 11 mg/dL (ref 8–23)
CO2: 24 mmol/L (ref 22–32)
Calcium: 8.8 mg/dL — ABNORMAL LOW (ref 8.9–10.3)
Chloride: 104 mmol/L (ref 98–111)
Creatinine, Ser: 0.93 mg/dL (ref 0.44–1.00)
GFR calc Af Amer: >60 mL/min (ref >60)
GFR calc non Af Amer: >60 mL/min (ref >60)
Glucose, Bld: 105 mg/dL — ABNORMAL HIGH (ref 70–99)
Potassium: 4.5 mmol/L (ref 3.5–5.1)
Sodium: 138 mmol/L (ref 135–145)
Total Bilirubin: 0.7 mg/dL (ref 0.3–1.2)
Total Protein: 6.8 g/dL (ref 6.5–8.1)

## 2020-03-02 LAB — LIPASE, BLOOD: Lipase: 19 U/L (ref 11–51)

## 2020-03-02 MED ORDER — DOCUSATE SODIUM 50 MG/5ML PO LIQD
50.0000 mg | Freq: Once | ORAL | Status: AC
  Administered 2020-03-02: 50 mg
  Filled 2020-03-02: qty 10

## 2020-03-02 MED ORDER — MAGNESIUM CITRATE PO SOLN
1.0000 | Freq: Once | ORAL | Status: AC
  Administered 2020-03-02: 1
  Filled 2020-03-02: qty 296

## 2020-03-02 MED ORDER — SENNOSIDES-DOCUSATE SODIUM 8.6-50 MG PO TABS
1.0000 | ORAL_TABLET | Freq: Every day | ORAL | 0 refills | Status: DC
Start: 2020-03-02 — End: 2021-05-28

## 2020-03-02 MED ORDER — MAGNESIUM CITRATE PO SOLN: 1.0000 | Freq: Every day | ORAL | 0 refills | Status: DC

## 2020-03-02 NOTE — ED Notes (Signed)
Pt changed into gown and educated on impending enema.

## 2020-03-02 NOTE — ED Notes (Signed)
Pt ambulating to bathroom at this time.  

## 2020-03-02 NOTE — ED Provider Notes (Signed)
[facility] Emergency Department Provider Note  ____________________________________________  Time seen: Approximately 1:02 PM  I have reviewed the triage vital signs and the nursing notes.   HISTORY  Chief Complaint Constipation    HPI Morgan Valdez is a 74 y.o. female who presents the emergency department complaining of abdominal cramping in the left lower quadrant, constipation.  Patient states that she has a history of constipation, she will typically have periods where it is slightly worse but typically resolves with as needed medication.  Patient states that she has been talking to her GI doctor about her constipation.  She states that she believes that her GI service has misunderstood her as she is having a little bit of mucus and fluid as well as a few specks of stool passing but she has not had a true bowel movement in over a week.  Patient states that her GI doctor believes that she is having diarrhea.  She states that this is not a diarrhea or loose stool situation.  She states that she is constipated, and just having a little bit of mucus and water past.  Patient is also having pain with her hemorrhoids but is taking medication for same.  She states that it is just "raw" feeling from using the restroom.  She is seen a few drops of blood consistent with her hemorrhoids.  Patient states that she recently had labs to ensure no infection.  She states that her stool specimen, her other lab work is reassuring.   Patient is here for more aggressive therapy for constipation relief.        Past Medical History:  Diagnosis Date  . Allergic state   . Arthritis   . Barrett's esophagus   . Chronic cough   . COPD (chronic obstructive pulmonary disease) ([place])   . Depression   . Duodenitis   . Esophageal motility disorder   . Gastritis   . GERD (gastroesophageal reflux disease)   . Headache    migraines - 3-4x/yr  . Tobacco abuse disorder   . Varicose veins  of legs     Patient Active Problem List   Diagnosis Date Noted  . Fever 07/23/2018    Past Surgical History:  Procedure Laterality Date  . APPENDECTOMY    . CATARACT EXTRACTION W/PHACO Right 01/03/2017   Procedure: CATARACT EXTRACTION PHACO AND INTRAOCULAR LENS PLACEMENT ([place])  Right;  Surgeon: Ronnell Freshwater, MD;  Location: [place];  Service: Ophthalmology;  Laterality: Right;  . CATARACT EXTRACTION W/PHACO Left 01/17/2017   Procedure: CATARACT EXTRACTION PHACO AND INTRAOCULAR LENS PLACEMENT ([facility]);  Surgeon: Ronnell Freshwater, MD;  Location: [place];  Service: Ophthalmology;  Laterality: Left;  left  . CHOLECYSTECTOMY    . COLONOSCOPY WITH PROPOFOL N/A 04/06/2018   Procedure: COLONOSCOPY WITH PROPOFOL;  Surgeon: Lollie Sails, MD;  Location: [facility] ENDOSCOPY;  Service: Endoscopy;  Laterality: N/A;  . ESOPHAGOGASTRODUODENOSCOPY N/A 09/26/2018   Procedure: ESOPHAGOGASTRODUODENOSCOPY (EGD);  Surgeon: Lollie Sails, MD;  Location[facility] ENDOSCOPY;  Service: Endoscopy;  Laterality: N/A;  . ESOPHAGOGASTRODUODENOSCOPY (EGD) WITH PROPOFOL N/A 10/14/2015   Procedure: ESOPHAGOGASTRODUODENOSCOPY (EGD) WITH PROPOFOL;  Surgeon: Lollie Sails, MD;  Location: [facility] ENDOSCOPY;  Service: Endoscopy;  Laterality: N/A;  . EYE SURGERY    . HERNIA REPAIR      Prior to Admission medications   Medication Sig Start Date End Date Taking? Authorizing Provider  albuterol (PROVENTIL HFA;VENTOLIN HFA) 108 (90 Base) MCG/ACT inhaler Inhale 2 puffs  into the lungs every 6 (six) hours as needed for wheezing or shortness of breath. 05/04/16   Schuyler Amor, MD  buPROPion (WELLBUTRIN SR) 150 MG 12 hr tablet Take 150 mg by mouth 2 (two) times daily.    [provider]  cephALEXin (KEFLEX) 500 MG capsule Take 1 capsule (500 mg total) by mouth every 6 (six) hours. Patient not taking: Reported on 09/26/2018 07/25/18   Gladstone Lighter, MD  cetirizine (ZYRTEC) 10 MG  tablet Take 10 mg by mouth at bedtime.     [provider]  fluticasone (FLONASE) 50 MCG/ACT nasal spray Place 2 sprays into both nostrils daily.     [provider]  furosemide (LASIX) 20 MG tablet Take 20 mg by mouth as needed for fluid.     [provider]  magnesium citrate SOLN Take 296 mLs (1 Bottle total) by mouth daily. Daily until stools are soft and regular 03/02/20   Jodi Kappes, Charline Bills, PA-C  ondansetron (ZOFRAN) 4 MG tablet Take 4 mg by mouth 2 (two) times daily as needed for nausea/vomiting. 07/21/18   [provider]  pramipexole (MIRAPEX) 0.5 MG tablet Take 0.5 mg by mouth 3 (three) times daily.    [provider]  promethazine (PHENERGAN) 25 MG tablet Take 1 tablet (25 mg total) by mouth every 6 (six) hours as needed for nausea or vomiting. 07/25/18   Gladstone Lighter, MD  senna-docusate (SENOKOT-S) 8.6-50 MG tablet Take 1 tablet by mouth daily. 03/02/20   Aryianna Earwood, Charline Bills, PA-C  sucralfate (CARAFATE) 1 g tablet Take 1 g by mouth 4 (four) times daily -  with meals and at bedtime.    [provider]  tiZANidine (ZANAFLEX) 2 MG tablet Take 2 mg by mouth as needed for muscle spasms.    [provider]    Allergies Bacitracin and Other  Family History  Problem Relation Age of Onset  . Breast cancer Neg Hx     Social History Social History   Tobacco Use  . Smoking status: Current Every Day Smoker    Packs/day: 0.50    Years: 50.00    Pack years: 25.00  . Smokeless tobacco: Never Used  Substance Use Topics  . Alcohol use: Yes    Comment: rare - Holidays  . Drug use: No     Review of Systems  Constitutional: No fever/chills Eyes: No visual changes. No discharge ENT: No upper respiratory complaints. Cardiovascular: no chest pain. Respiratory: no cough. No SOB. Gastrointestinal: Mild left-sided abdominal cramping.  No frank abdominal pain.  No nausea, no vomiting.  No diarrhea.  Positive for  constipation. Genitourinary: Negative for dysuria. No hematuria Musculoskeletal: Negative for musculoskeletal pain. Skin: Negative for rash, abrasions, lacerations, ecchymosis. Neurological: Negative for headaches, focal weakness or numbness. 10-point ROS otherwise negative.  ____________________________________________   PHYSICAL EXAM:  VITAL SIGNS: ED Triage Vitals  Enc Vitals Group     BP 03/02/20 1019 140/69     Pulse Rate 03/02/20 1019 99     Resp 03/02/20 1019 18     Temp 03/02/20 1019 98.2 F (36.8 C)     Temp Source 03/02/20 1019 Oral     SpO2 03/02/20 1019 96 %     Weight 03/02/20 1029 160 lb (72.6 kg)     Height 03/02/20 1029 5\' 7"  (1.702 m)     Head Circumference --      Peak Flow --      Pain Score 03/02/20 1029 5  Pain Loc --      Pain Edu? --      Excl. in [place]? --      Constitutional: Alert and oriented. Well appearing and in no acute distress. Eyes: Conjunctivae are normal. PERRL. EOMI. Head: Atraumatic. ENT:      Ears:       Nose: No congestion/rhinnorhea.      Mouth/Throat: Mucous membranes are moist.  Neck: No stridor.    Cardiovascular: Normal rate, regular rhythm. Normal S1 and S2.  Good peripheral circulation. Respiratory: Normal respiratory effort without tachypnea or retractions. Lungs CTAB. Good air entry to the bases with no decreased or absent breath sounds. Gastrointestinal: Bowel sounds 4 quadrants. Soft and nontender to palpation. No guarding or rigidity. No palpable masses. No distention. No CVA tenderness. Musculoskeletal: Full range of motion to all extremities. No gross deformities appreciated. Neurologic:  Normal speech and language. No gross focal neurologic deficits are appreciated.  Skin:  Skin is warm, dry and intact. No rash noted. Psychiatric: Mood and affect are normal. Speech and behavior are normal. Patient exhibits appropriate insight and judgement.   ____________________________________________   LABS (all labs  ordered are listed, but only abnormal results are displayed)  Labs Reviewed  COMPREHENSIVE METABOLIC PANEL - Abnormal; Notable for the following components:      Result Value   Glucose, Bld 105 (*)    Calcium 8.8 (*)    AST 13 (*)    All other components within normal limits  CBC - Abnormal; Notable for the following components:   Hemoglobin 15.7 (*)    HCT 46.9 (*)    All other components within normal limits  URINALYSIS, COMPLETE (UACMP) WITH MICROSCOPIC - Abnormal; Notable for the following components:   Color, Urine YELLOW (*)    APPearance HAZY (*)    Ketones, ur 5 (*)    Leukocytes,Ua TRACE (*)    All other components within normal limits  LIPASE, BLOOD   ____________________________________________  EKG   ____________________________________________  RADIOLOGY   No results found.  ____________________________________________    PROCEDURES  Procedure(s) performed:    Procedures    Medications  docusate (COLACE) 50 MG/5ML liquid 50 mg (50 mg Per Tube Given 03/02/20 1344)  magnesium citrate solution 1 Bottle (1 Bottle Per Tube Given 03/02/20 1344)     ____________________________________________   INITIAL IMPRESSION / ASSESSMENT AND PLAN / ED COURSE  Pertinent labs & imaging results that were available during my care of the patient were reviewed by me and considered in my medical decision making (see chart for details).  Review of the [place] CSRS was performed in accordance of the [place] prior to dispensing any controlled drugs.  Clinical Course as of Mar 03 1527  Sun Mar 02, 2020  1307 Patient presented to emergency department complaining of constipation.  She states that she has a long history of constipation.  She contacted her GI doctor, had labs, stool specimen without any acute findings.  Patient believes that her GI doctor has misunderstood with her as she is constipated she is having some mucus and water past.  She believes that they have misunderstood  and have prescribed her medications for diarrhea.  Patient states that she is not having diarrhea as she is having constipation.  She has tried her normal home medicines without relief.  She does have a little bit of bleeding from her hemorrhoids which she attributes to wiping more frequently.  She does have some left-sided abdominal cramping but no frank pain.  Exam was reassuring.  Labs at this time are reassuring.  Patient will be given enema for symptom relief.   [JC]    Clinical Course User Index [JC] Taevyn Hausen, Charline Bills, PA-C          Patient's diagnosis is consistent with constipation.  Patient presented to the emergency department with a history of chronic constipation with worsening symptoms over the past week.  She had talked to GI but there appears to be some confusion and GI thought that she was having diarrhea instead of the leakage around her constipation.  Patient has had some cramping but no frank abdominal pain.  Patient has had reassuring labs ordered by GI, and labs today are also reassuring.  Findings are consistent with worsening chronic constipation.  Patient is given an enema which does produce some stool here in the emergency department.  She will be discharged with prescriptions for mag citrate and Senokot.  Patient is also to use fiber or MiraLAX at home.  Drink plenty of fluids.  Follow-up with GI.Marland Kitchen  Patient is given ED precautions to return to the ED for any worsening or new symptoms.     ____________________________________________  FINAL CLINICAL IMPRESSION(S) / ED DIAGNOSES  Final diagnoses:  Slow transit constipation      NEW MEDICATIONS STARTED DURING THIS VISIT:  ED Discharge Orders         Ordered    senna-docusate (SENOKOT-S) 8.6-50 MG tablet  Daily     03/02/20 1526    magnesium citrate SOLN  Daily    Note to Pharmacy: 10 bottles   03/02/20 1526              This chart was dictated using voice recognition software/Dragon. Despite best  efforts to proofread, errors can occur which can change the meaning. Any change was purely unintentional.    Darletta Moll, PA-C 03/02/20 1528    Lilia Pro., MD 03/02/20 [id number]

## 2020-03-02 NOTE — ED Triage Notes (Addendum)
Pt arrived via POV with reports of constipation, states last BM was over 1 week ago.    Pt states she called GI on Tuesday pt sees [employer], pt states she was prescribed some meds this week, but hasn't helped. Pt states she has been able to get out just a few hard balls of stool out.   Pt taking Linzess for the past 3 days.   Pt also using hemorrhoid cream.  Pt states she has abdominal cramping with the constipation as well.

## 2020-03-02 NOTE — ED Notes (Signed)
Was able to insert all of the enema, pt held for aproximately five minutes.

## 2020-03-02 NOTE — ED Notes (Signed)
Pt sitting on toilet. States "acouple balls came out," but says she does not have relief yet.

## 2020-03-10 DIAGNOSIS — K5909 Other constipation: Secondary | ICD-10-CM | POA: Diagnosis not present

## 2020-03-10 DIAGNOSIS — K5900 Constipation, unspecified: Secondary | ICD-10-CM | POA: Diagnosis not present

## 2020-03-19 ENCOUNTER — Other Ambulatory Visit (HOSPITAL_COMMUNITY): Payer: Self-pay | Admitting: Gastroenterology

## 2020-03-19 DIAGNOSIS — K5909 Other constipation: Secondary | ICD-10-CM | POA: Diagnosis not present

## 2020-03-19 DIAGNOSIS — F339 Major depressive disorder, recurrent, unspecified: Secondary | ICD-10-CM | POA: Diagnosis not present

## 2020-03-19 DIAGNOSIS — K222 Esophageal obstruction: Secondary | ICD-10-CM | POA: Diagnosis not present

## 2020-03-19 DIAGNOSIS — R1031 Right lower quadrant pain: Secondary | ICD-10-CM

## 2020-03-19 DIAGNOSIS — R112 Nausea with vomiting, unspecified: Secondary | ICD-10-CM

## 2020-03-19 DIAGNOSIS — Z86010 Personal history of colonic polyps: Secondary | ICD-10-CM | POA: Diagnosis not present

## 2020-03-19 DIAGNOSIS — J449 Chronic obstructive pulmonary disease, unspecified: Secondary | ICD-10-CM | POA: Diagnosis not present

## 2020-03-19 DIAGNOSIS — R634 Abnormal weight loss: Secondary | ICD-10-CM

## 2020-03-19 DIAGNOSIS — R1032 Left lower quadrant pain: Secondary | ICD-10-CM

## 2020-03-19 DIAGNOSIS — R194 Change in bowel habit: Secondary | ICD-10-CM | POA: Diagnosis not present

## 2020-03-24 ENCOUNTER — Other Ambulatory Visit: Payer: Self-pay

## 2020-03-24 ENCOUNTER — Ambulatory Visit
Admission: RE | Admit: 2020-03-24 | Discharge: 2020-03-24 | Disposition: A | Discharge status: Home / Self Care | Payer: Medicare HMO | Source: Ambulatory Visit | Attending: Gastroenterology | Admitting: Gastroenterology

## 2020-03-24 DIAGNOSIS — R112 Nausea with vomiting, unspecified: Secondary | ICD-10-CM | POA: Diagnosis not present

## 2020-03-24 DIAGNOSIS — R634 Abnormal weight loss: Secondary | ICD-10-CM | POA: Diagnosis not present

## 2020-03-24 DIAGNOSIS — R1031 Right lower quadrant pain: Secondary | ICD-10-CM | POA: Insufficient documentation

## 2020-03-24 DIAGNOSIS — R1110 Vomiting, unspecified: Secondary | ICD-10-CM | POA: Diagnosis not present

## 2020-03-24 DIAGNOSIS — R1032 Left lower quadrant pain: Secondary | ICD-10-CM | POA: Insufficient documentation

## 2020-03-24 MED ORDER — IOHEXOL 300 MG/ML  SOLN
100.0000 mL | Freq: Once | INTRAMUSCULAR | Status: AC | PRN
  Administered 2020-03-24: 100 mL via INTRAVENOUS

## 2020-04-23 DIAGNOSIS — R1031 Right lower quadrant pain: Secondary | ICD-10-CM | POA: Diagnosis not present

## 2020-04-23 DIAGNOSIS — Z86010 Personal history of colonic polyps: Secondary | ICD-10-CM | POA: Diagnosis not present

## 2020-04-23 DIAGNOSIS — F339 Major depressive disorder, recurrent, unspecified: Secondary | ICD-10-CM | POA: Diagnosis not present

## 2020-04-23 DIAGNOSIS — R1032 Left lower quadrant pain: Secondary | ICD-10-CM | POA: Diagnosis not present

## 2020-04-23 DIAGNOSIS — K5909 Other constipation: Secondary | ICD-10-CM | POA: Diagnosis not present

## 2020-04-23 DIAGNOSIS — K224 Dyskinesia of esophagus: Secondary | ICD-10-CM | POA: Diagnosis not present

## 2020-04-23 DIAGNOSIS — J449 Chronic obstructive pulmonary disease, unspecified: Secondary | ICD-10-CM | POA: Diagnosis not present

## 2020-04-30 DIAGNOSIS — R8290 Unspecified abnormal findings in urine: Secondary | ICD-10-CM | POA: Diagnosis not present

## 2020-04-30 DIAGNOSIS — E785 Hyperlipidemia, unspecified: Secondary | ICD-10-CM | POA: Diagnosis not present

## 2020-04-30 DIAGNOSIS — F3341 Major depressive disorder, recurrent, in partial remission: Secondary | ICD-10-CM | POA: Diagnosis not present

## 2020-04-30 DIAGNOSIS — G2581 Restless legs syndrome: Secondary | ICD-10-CM | POA: Diagnosis not present

## 2020-04-30 DIAGNOSIS — R7309 Other abnormal glucose: Secondary | ICD-10-CM | POA: Diagnosis not present

## 2020-04-30 DIAGNOSIS — M5441 Lumbago with sciatica, right side: Secondary | ICD-10-CM | POA: Diagnosis not present

## 2020-04-30 DIAGNOSIS — J449 Chronic obstructive pulmonary disease, unspecified: Secondary | ICD-10-CM | POA: Diagnosis not present

## 2020-04-30 DIAGNOSIS — M48062 Spinal stenosis, lumbar region with neurogenic claudication: Secondary | ICD-10-CM | POA: Diagnosis not present

## 2020-04-30 DIAGNOSIS — Z720 Tobacco use: Secondary | ICD-10-CM | POA: Diagnosis not present

## 2020-04-30 DIAGNOSIS — K222 Esophageal obstruction: Secondary | ICD-10-CM | POA: Diagnosis not present

## 2020-05-07 DIAGNOSIS — M545 Low back pain: Secondary | ICD-10-CM | POA: Diagnosis not present

## 2020-05-07 DIAGNOSIS — R0789 Other chest pain: Secondary | ICD-10-CM | POA: Diagnosis not present

## 2020-05-07 DIAGNOSIS — F419 Anxiety disorder, unspecified: Secondary | ICD-10-CM | POA: Diagnosis not present

## 2020-05-07 DIAGNOSIS — E785 Hyperlipidemia, unspecified: Secondary | ICD-10-CM | POA: Diagnosis not present

## 2020-05-07 DIAGNOSIS — J449 Chronic obstructive pulmonary disease, unspecified: Secondary | ICD-10-CM | POA: Diagnosis not present

## 2020-05-07 DIAGNOSIS — L608 Other nail disorders: Secondary | ICD-10-CM | POA: Diagnosis not present

## 2020-05-07 DIAGNOSIS — M25551 Pain in right hip: Secondary | ICD-10-CM | POA: Diagnosis not present

## 2020-05-07 DIAGNOSIS — F329 Major depressive disorder, single episode, unspecified: Secondary | ICD-10-CM | POA: Diagnosis not present

## 2020-05-07 DIAGNOSIS — K295 Unspecified chronic 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: Secondary | ICD-10-CM | POA: Diagnosis not present

## 2020-06-04 DIAGNOSIS — E782 Mixed hyperlipidemia: Secondary | ICD-10-CM | POA: Diagnosis not present

## 2020-06-04 DIAGNOSIS — Z720 Tobacco use: Secondary | ICD-10-CM | POA: Diagnosis not present

## 2020-06-04 DIAGNOSIS — R0789 Other chest pain: Secondary | ICD-10-CM | POA: Diagnosis not present

## 2020-06-04 DIAGNOSIS — J449 Chronic obstructive pulmonary disease, unspecified: Secondary | ICD-10-CM | POA: Diagnosis not present

## 2020-06-04 DIAGNOSIS — R0600 Dyspnea, unspecified: Secondary | ICD-10-CM | POA: Diagnosis not present

## 2020-07-24 DIAGNOSIS — R0600 Dyspnea, unspecified: Secondary | ICD-10-CM | POA: Diagnosis not present

## 2020-07-24 DIAGNOSIS — R0789 Other chest pain: Secondary | ICD-10-CM | POA: Diagnosis not present

## 2020-07-31 DIAGNOSIS — R0789 Other chest pain: Secondary | ICD-10-CM | POA: Diagnosis not present

## 2020-07-31 DIAGNOSIS — J449 Chronic obstructive pulmonary disease, unspecified: Secondary | ICD-10-CM | POA: Diagnosis not present

## 2020-07-31 DIAGNOSIS — R0600 Dyspnea, unspecified: Secondary | ICD-10-CM | POA: Diagnosis not present

## 2020-07-31 DIAGNOSIS — E782 Mixed hyperlipidemia: Secondary | ICD-10-CM | POA: Diagnosis not present

## 2020-07-31 DIAGNOSIS — Z720 Tobacco use: Secondary | ICD-10-CM | POA: Diagnosis not present

## 2020-08-14 DIAGNOSIS — R053 Chronic cough: Secondary | ICD-10-CM | POA: Diagnosis not present

## 2020-08-14 DIAGNOSIS — F17218 Nicotine dependence, cigarettes, with other nicotine-induced disorders: Secondary | ICD-10-CM | POA: Diagnosis not present

## 2020-08-14 DIAGNOSIS — R0602 Shortness of breath: Secondary | ICD-10-CM | POA: Diagnosis not present

## 2020-08-14 DIAGNOSIS — J449 Chronic obstructive pulmonary disease, unspecified: Secondary | ICD-10-CM | POA: Diagnosis not present

## 2020-08-14 DIAGNOSIS — R059 Cough, unspecified: Secondary | ICD-10-CM | POA: Diagnosis not present

## 2020-09-05 ENCOUNTER — Other Ambulatory Visit: Payer: Self-pay | Admitting: Internal Medicine

## 2020-09-05 DIAGNOSIS — Z1231 Encounter for screening mammogram for malignant neoplasm of breast: Secondary | ICD-10-CM

## 2020-09-11 DIAGNOSIS — J3089 Other allergic rhinitis: Secondary | ICD-10-CM | POA: Diagnosis not present

## 2020-09-11 DIAGNOSIS — R0789 Other chest pain: Secondary | ICD-10-CM | POA: Diagnosis not present

## 2020-09-11 DIAGNOSIS — R7309 Other abnormal glucose: Secondary | ICD-10-CM | POA: Diagnosis not present

## 2020-09-11 DIAGNOSIS — J449 Chronic obstructive pulmonary disease, unspecified: Secondary | ICD-10-CM | POA: Diagnosis not present

## 2020-09-11 DIAGNOSIS — F3341 Major depressive disorder, recurrent, in partial remission: Secondary | ICD-10-CM | POA: Diagnosis not present

## 2020-09-11 DIAGNOSIS — Z720 Tobacco use: Secondary | ICD-10-CM | POA: Diagnosis not present

## 2020-09-18 DIAGNOSIS — Z23 Encounter for immunization: Secondary | ICD-10-CM | POA: Diagnosis not present

## 2020-09-18 DIAGNOSIS — Z5320 Procedure and treatment not carried out because of patient's decision for unspecified reasons: Secondary | ICD-10-CM | POA: Diagnosis not present

## 2020-09-18 DIAGNOSIS — Z0000 Encounter for general adult medical examination without abnormal findings: Secondary | ICD-10-CM | POA: Diagnosis not present

## 2020-09-18 DIAGNOSIS — M4800 Spinal stenosis, site unspecified: Secondary | ICD-10-CM | POA: Diagnosis not present

## 2020-09-18 DIAGNOSIS — R7303 Prediabetes: Secondary | ICD-10-CM | POA: Diagnosis not present

## 2020-09-18 DIAGNOSIS — M545 Low back pain, unspecified: Secondary | ICD-10-CM | POA: Diagnosis not present

## 2020-09-18 DIAGNOSIS — G8929 Other chronic pain: Secondary | ICD-10-CM | POA: Diagnosis not present

## 2020-09-18 DIAGNOSIS — I700 Atherosclerosis of aorta: Secondary | ICD-10-CM | POA: Diagnosis not present

## 2020-09-18 DIAGNOSIS — F32A Depression, unspecified: Secondary | ICD-10-CM | POA: Diagnosis not present

## 2020-09-18 DIAGNOSIS — J449 Chronic obstructive pulmonary disease, unspecified: Secondary | ICD-10-CM | POA: Diagnosis not present

## 2020-10-15 ENCOUNTER — Other Ambulatory Visit: Payer: Self-pay

## 2020-10-15 ENCOUNTER — Ambulatory Visit
Admission: RE | Admit: 2020-10-15 | Discharge: 2020-10-15 | Disposition: A | Discharge status: Home / Self Care | Payer: Medicare HMO | Source: Ambulatory Visit | Attending: Internal Medicine | Admitting: Internal Medicine

## 2020-10-15 DIAGNOSIS — Z1231 Encounter for screening mammogram for malignant neoplasm of breast: Secondary | ICD-10-CM | POA: Diagnosis not present

## 2020-10-22 ENCOUNTER — Other Ambulatory Visit: Payer: Self-pay | Admitting: Internal Medicine

## 2020-10-22 DIAGNOSIS — R928 Other abnormal and inconclusive findings on diagnostic imaging of breast: Secondary | ICD-10-CM

## 2020-10-22 DIAGNOSIS — N6320 Unspecified lump in the left breast, unspecified quadrant: Secondary | ICD-10-CM

## 2020-11-01 DIAGNOSIS — Z923 Personal history of irradiation: Secondary | ICD-10-CM

## 2020-11-01 HISTORY — DX: Personal history of irradiation: Z92.3

## 2020-12-02 ENCOUNTER — Telehealth: Payer: Self-pay | Admitting: *Deleted

## 2020-12-02 NOTE — Telephone Encounter (Signed)
10/23/20 - Called pt to schedule AV / MAMMO APPT - PT STATED SHE JUST HAD A FAMILY MEMBER MOVE IN WITH HER THAT IS IN FINAL STAGE OF LIFE AND SHE CANNOT LEAVE THAT PERSON ALONE.   PT STATED SHE WCB WHEN SHE IS IN A BETTER POSITION TO SCHEDULE TO COME IN.  SHE HAS MY # -SENT OVER DUE LETTER TO PT ON 11/20/20  Still no response from pt - sending certified letter - 07/07/31

## 2020-12-24 ENCOUNTER — Ambulatory Visit
Admission: RE | Admit: 2020-12-24 | Discharge: 2020-12-24 | Disposition: A | Discharge status: Home / Self Care | Payer: Medicare HMO | Source: Ambulatory Visit | Attending: Internal Medicine | Admitting: Internal Medicine

## 2020-12-24 ENCOUNTER — Other Ambulatory Visit: Payer: Self-pay

## 2020-12-24 DIAGNOSIS — R928 Other abnormal and inconclusive findings on diagnostic imaging of breast: Secondary | ICD-10-CM

## 2020-12-24 DIAGNOSIS — N6320 Unspecified lump in the left breast, unspecified quadrant: Secondary | ICD-10-CM | POA: Diagnosis not present

## 2020-12-24 DIAGNOSIS — R922 Inconclusive mammogram: Secondary | ICD-10-CM | POA: Diagnosis not present

## 2020-12-24 DIAGNOSIS — N6321 Unspecified lump in the left breast, upper outer quadrant: Secondary | ICD-10-CM | POA: Diagnosis not present

## 2020-12-26 ENCOUNTER — Other Ambulatory Visit: Payer: Self-pay | Admitting: Internal Medicine

## 2020-12-26 DIAGNOSIS — R928 Other abnormal and inconclusive findings on diagnostic imaging of breast: Secondary | ICD-10-CM

## 2020-12-26 DIAGNOSIS — N6320 Unspecified lump in the left breast, unspecified quadrant: Secondary | ICD-10-CM

## 2020-12-30 HISTORY — PX: BREAST LUMPECTOMY: SHX2

## 2021-01-02 ENCOUNTER — Ambulatory Visit
Admission: RE | Admit: 2021-01-02 | Discharge: 2021-01-02 | Disposition: A | Discharge status: Home / Self Care | Payer: Medicare HMO | Source: Ambulatory Visit | Attending: Internal Medicine | Admitting: Internal Medicine

## 2021-01-02 ENCOUNTER — Other Ambulatory Visit: Payer: Self-pay

## 2021-01-02 DIAGNOSIS — C50412 Malignant neoplasm of upper-outer quadrant of left female breast: Secondary | ICD-10-CM | POA: Diagnosis not present

## 2021-01-02 DIAGNOSIS — N6320 Unspecified lump in the left breast, unspecified quadrant: Secondary | ICD-10-CM | POA: Insufficient documentation

## 2021-01-02 DIAGNOSIS — R928 Other abnormal and inconclusive findings on diagnostic imaging of breast: Secondary | ICD-10-CM

## 2021-01-02 HISTORY — PX: BREAST BIOPSY: SHX20

## 2021-01-07 DIAGNOSIS — C50919 Malignant neoplasm of unspecified site of unspecified female breast: Secondary | ICD-10-CM

## 2021-01-08 NOTE — Progress Notes (Signed)
Navigation initiated.  Scheduled Med/Onc consult with Dr. Rogue Bussing, and surgical consult with Dr. Windell Moment per patient request.

## 2021-01-09 LAB — SURGICAL PATHOLOGY

## 2021-01-13 DIAGNOSIS — Z86010 Personal history of colonic polyps: Secondary | ICD-10-CM | POA: Diagnosis not present

## 2021-01-13 DIAGNOSIS — K5909 Other constipation: Secondary | ICD-10-CM | POA: Diagnosis not present

## 2021-01-13 DIAGNOSIS — Z8719 Personal history of other diseases of the digestive system: Secondary | ICD-10-CM | POA: Diagnosis not present

## 2021-01-13 DIAGNOSIS — K224 Dyskinesia of esophagus: Secondary | ICD-10-CM | POA: Diagnosis not present

## 2021-01-13 DIAGNOSIS — R0600 Dyspnea, unspecified: Secondary | ICD-10-CM | POA: Diagnosis not present

## 2021-01-13 DIAGNOSIS — F17210 Nicotine dependence, cigarettes, uncomplicated: Secondary | ICD-10-CM | POA: Diagnosis not present

## 2021-01-13 DIAGNOSIS — J449 Chronic obstructive pulmonary disease, unspecified: Secondary | ICD-10-CM | POA: Diagnosis not present

## 2021-01-13 DIAGNOSIS — K219 Gastro-esophageal reflux disease without esophagitis: Secondary | ICD-10-CM | POA: Diagnosis not present

## 2021-01-13 DIAGNOSIS — K5730 Diverticulosis of large intestine without perforation or abscess without bleeding: Secondary | ICD-10-CM | POA: Diagnosis not present

## 2021-01-13 DIAGNOSIS — Z01818 Encounter for other preprocedural examination: Secondary | ICD-10-CM | POA: Diagnosis not present

## 2021-01-15 ENCOUNTER — Other Ambulatory Visit: Payer: Medicare HMO

## 2021-01-15 ENCOUNTER — Ambulatory Visit: Payer: Medicare HMO | Admitting: Internal Medicine

## 2021-01-16 ENCOUNTER — Ambulatory Visit: Payer: Self-pay | Admitting: General Surgery

## 2021-01-16 DIAGNOSIS — Z170 Estrogen receptor positive status [ER+]: Secondary | ICD-10-CM | POA: Diagnosis not present

## 2021-01-16 DIAGNOSIS — C50412 Malignant neoplasm of upper-outer quadrant of left female breast: Secondary | ICD-10-CM | POA: Diagnosis not present

## 2021-01-16 NOTE — H&P (Signed)
PATIENT PROFILE: Morgan Valdez is a 75 y.o. female who presents to the Clinic for consultation at the request of Dr. Ginette Valdez for evaluation of breast cancer.  PCP:  Morgan Glatter, MD  HISTORY OF PRESENT ILLNESS: Morgan Valdez reports she had her usual screening mammogram.  Did screening mammogram showed a small mass in the left breast.  This led to diagnostic mammogram and ultrasound.  The diagnostic mammogram ultrasound shows a 6 mm mass at the 2 o'clock position of the left breast.  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  Axillary lymph nodes appear normal on ultrasound.  I personally evaluated the images of the screening mammogram, diagnostic mammogram and ultrasound.  Family history of breast cancer: None Family history of other cancers: Multiple family members with different type of cancers Menarche: 75 years old Menopause: 75 years old Used OCP: Yes Used estrogen and progesterone therapy: Yes for 1 year History of Radiation to the chest: No Number of pregnancies: 4 Age of first pregnancy: 46 Previous breast biopsy: None  PROBLEM LIST: Problem List  Date Reviewed: 01/13/2021         Noted   COPD, mild , unspecified (CMS-HCC) 01/03/2020   Major depressive disorder, recurrent, in partial remission (CMS-HCC) 01/03/2020   Perennial allergic rhinitis 01/03/2020   Esophageal stenosis 01/03/2020   Elevated hemoglobin A1c 10/30/2018   Loose stools 10/30/2018   Depression, recurrent (CMS-HCC) 08/08/2018   Spinal stenosis of lumbar region with neurogenic claudication 11/10/2017   Stress bladder incontinence, female 04/08/2016   Chronic right-sided low back pain without sciatica 12/10/2015   Chronic superficial 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 08/06/2015   Allergic state Unknown   Depression Unknown   Barrett's esophagus Unknown   RLS (restless legs syndrome) 11/14/2014   Tobacco use Unknown      GENERAL REVIEW OF SYSTEMS:   General ROS:  negative for - chills, fatigue, fever, weight gain or weight loss Allergy and Immunology ROS: negative for - hives  Hematological and Lymphatic ROS: negative for - bleeding problems or bruising, negative for palpable nodes Endocrine ROS: negative for - heat or cold intolerance, hair changes Respiratory ROS: Positive for - cough, shortness of breath or wheezing Cardiovascular ROS: no chest pain or palpitations GI ROS: negative for nausea, vomiting, abdominal pain, diarrhea, constipation Musculoskeletal ROS: negative for - joint swelling or muscle pain Neurological ROS: negative for - confusion, syncope Dermatological ROS: negative for pruritus and rash Psychiatric: negative for anxiety, depression, difficulty sleeping and memory loss  MEDICATIONS: Current Outpatient Medications  Medication Sig Dispense Refill  . buPROPion (WELLBUTRIN SR) 150 MG SR tablet TAKE 1 TABLET(150 MG) BY MOUTH TWICE DAILY 180 tablet 0  . FUROsemide (LASIX) 20 MG tablet TAKE 1 TABLET(20 MG) BY MOUTH EVERY DAY AS NEEDED FOR SWELLING 30 tablet 0  . gabapentin (NEURONTIN) 100 MG capsule TAKE 2 CAPSULES(200 MG) BY MOUTH THREE TIMES DAILY 180 capsule 0  . omeprazole (PRILOSEC) 40 MG DR capsule TAKE 1 CAPSULE(40 MG) BY MOUTH EVERY DAY 90 capsule 1  . omeprazole (PRILOSEC) 40 MG DR capsule Take 40 mg by mouth once daily    . ondansetron (ZOFRAN) 4 MG tablet Take 1 tablet (4 mg total) by mouth every 8 (eight) hours as needed for Nausea 30 tablet 1  . tiotropium (SPIRIVA) 18 mcg inhalation capsule Place 1 capsule (18 mcg total) into inhaler and inhale once daily TO REPLACE ANORO DUE TO INSURANCE FORMULARY 30 capsule 11  . albuterol 90 mcg/actuation inhaler  Inhale 2 inhalations into the lungs every 6 (six) hours as needed for Wheezing (Patient not taking: Reported on 09/18/2020  ) 1 Inhaler 5  . montelukast (SINGULAIR) 10 mg tablet Take 1 tablet (10 mg total) by mouth once daily (Patient not taking: Reported on 01/13/2021  ) 90  tablet 1  . sodium, potassium, and magnesium (SUPREP) oral solution Take 1 Bottle by mouth as directed One kit contains 2 bottles.  Take both bottles at the times instructed by your provider. (Patient not taking: Reported on 01/13/2021  ) 354 mL 0   No current facility-administered medications for this visit.    ALLERGIES: Bacitracin and Other  PAST MEDICAL HISTORY: Past Medical History:  Diagnosis Date  . Adenomatous polyps   . Allergic state   . Anxiety   . Arthritis   . Barrett's esophagus   . Breast cancer (CMS-HCC)    left   . Cataract cortical, senile removed in  2018  . COPD (chronic obstructive pulmonary disease) (CMS-HCC)   . Depression   . Duodenitis 10/14/2015  . Emphysema of lung (CMS-HCC)   . Esophageal motility disorder 10/14/2015  . Gastritis 10/14/2015  . GERD (gastroesophageal reflux disease)   . Osteoporosis, post-menopausal   . Perennial allergic rhinitis   . Tobacco use     PAST SURGICAL HISTORY: Past Surgical History:  Procedure Laterality Date  . APPENDECTOMY    . CATARACT EXTRACTION  2018  . CHOLECYSTECTOMY    . COLONOSCOPY  11/2012   4 polyps, divertics  . COLONOSCOPY  04/06/2018   Tubular adenoma of the colon/Hyperplastic colon polyp/Repeat 24yr/MUS  . EGD  10/14/2015   Esophageal motility disorder/Gastritis/Duodenitis/No Repeat/MUS  . EGD  09/26/2018   GERD/ Abnormal esophageal motility/Repeat 174yrUS  . EGD  11/13/2019   Reflux esophagitis/Gastritis/No Repeat/TKT  . FRACTURE SURGERY    . HERNIA REPAIR       FAMILY HISTORY: Family History  Problem Relation Age of Onset  . Cancer Mother   . Alzheimer's disease Father   . Stroke Other        GF  . Cancer Other        Half sister     SOCIAL HISTORY: Social History   Socioeconomic History  . Marital status: Widowed    Spouse name: Not on file  . Number of children: Not on file  . Years of education: Not on file  . Highest education level: Not on file  Occupational History  .  Not on file  Tobacco Use  . Smoking status: Light Tobacco Smoker    Packs/day: 0.50    Years: 50.00    Pack years: 25.00    Types: Cigarettes  . Smokeless tobacco: Never Used  . Tobacco comment: Unable to tolerate Chantix.  Vaping Use  . Vaping Use: Never used  Substance and Sexual Activity  . Alcohol use: Not Currently    Alcohol/week: 0.0 standard drinks    Comment: "small glass of wine before bed each night"  . Drug use: Never  . Sexual activity: Not Currently    Birth control/protection: Post-menopausal  Other Topics Concern  . Not on file  Social History Narrative  . Not on file   Social Determinants of Health   Financial Resource Strain: Not on file  Food Insecurity: Not on file  Transportation Needs: Not on file    PHYSICAL EXAM: Vitals:   01/16/21 1028  BP: (!) 155/80  Pulse: 85   Body mass index is 23.49 kg/m. Weight:  68 kg (150 lb)   GENERAL: Alert, active, oriented x3  HEENT: Pupils equal reactive to light. Extraocular movements are intact. Sclera clear. Palpebral conjunctiva normal red color.Pharynx clear.  NECK: Supple with no palpable mass and no adenopathy.  LUNGS: Sound clear with no rales rhonchi or wheezes.  HEART: Regular rhythm S1 and S2 without murmur.  BREAST: breasts appear normal, no suspicious masses, no skin or nipple changes or axillary nodes.  ABDOMEN: Soft and depressible, nontender with no palpable mass, no hepatomegaly.  EXTREMITIES: Well-developed well-nourished symmetrical with no dependent edema.  NEUROLOGICAL: Awake alert oriented, facial expression symmetrical, moving all extremities.  REVIEW OF DATA: I have reviewed the following data today: No visits with results within 3 Month(s) from this visit.  Latest known visit with results is:  Appointment on 09/11/2020  Component Date Value  . WBC (White Blood Cell Co* 09/11/2020 7.9   . RBC (Red Blood Cell Coun* 09/11/2020 4.78   . Hemoglobin 09/11/2020 14.6   .  Hematocrit 09/11/2020 44.8   . MCV (Mean Corpuscular Vo* 09/11/2020 93.7   . MCH (Mean Corpuscular He* 09/11/2020 30.5   . MCHC (Mean Corpuscular H* 09/11/2020 32.6   . Platelet Count 09/11/2020 274   . RDW-CV (Red Cell Distrib* 09/11/2020 13.3   . MPV (Mean Platelet Volum* 09/11/2020 10.1   . Neutrophils 09/11/2020 4.60   . Lymphocytes 09/11/2020 2.47   . Monocytes 09/11/2020 0.61   . Eosinophils 09/11/2020 0.18   . Basophils 09/11/2020 0.04   . Neutrophil % 09/11/2020 58.0   . Lymphocyte % 09/11/2020 31.2   . Monocyte % 09/11/2020 7.7   . Eosinophil % 09/11/2020 2.3   . Basophil% 09/11/2020 0.5   . Immature Granulocyte % 09/11/2020 0.3   . Immature Granulocyte Cou* 09/11/2020 0.02   . Glucose 09/11/2020 102   . Sodium 09/11/2020 142   . Potassium 09/11/2020 4.6   . Chloride 09/11/2020 106   . Carbon Dioxide (CO2) 09/11/2020 32.2 (!)  . Urea Nitrogen (BUN) 09/11/2020 12   . Creatinine 09/11/2020 0.9   . Glomerular Filtration Ra* 09/11/2020 61   . Calcium 09/11/2020 9.2   . AST  09/11/2020 11   . ALT  09/11/2020 8   . Alk Phos (alkaline Phosp* 09/11/2020 66   . Albumin 09/11/2020 3.9   . Bilirubin, Total 09/11/2020 0.5   . Protein, Total 09/11/2020 6.4   . A/G Ratio 09/11/2020 1.6   . Hemoglobin A1C 09/11/2020 6.2 (!)  . Average Blood Glucose (C* 09/11/2020 131   . Cholesterol, Total 09/11/2020 200   . Triglyceride 09/11/2020 105   . HDL (High Density Lipopr* 09/11/2020 50.6   . LDL Calculated 09/11/2020 128   . VLDL Cholesterol 09/11/2020 21   . Cholesterol/HDL Ratio 09/11/2020 4.0   . Urine Albumin, Random 09/11/2020 <7   . Thyroid Stimulating Horm* 09/11/2020 0.860   . Color 09/11/2020 Yellow   . Clarity 09/11/2020 Clear   . Specific Gravity 09/11/2020 1.015   . pH, Urine 09/11/2020 7.0   . Protein, Urinalysis 09/11/2020 Negative   . Glucose, Urinalysis 09/11/2020 Negative   . Ketones, Urinalysis 09/11/2020 Negative   . Blood, Urinalysis 09/11/2020 Negative   .  Nitrite, Urinalysis 09/11/2020 Negative   . Leukocyte Esterase, Urin* 09/11/2020 Trace (!)  . White Blood Cells, Urina* 09/11/2020 0-3   . Red Blood Cells, Urinaly* 09/11/2020 None Seen   . Bacteria, Urinalysis 09/11/2020 Rare (!)  . Squamous Epithelial Cell* 09/11/2020 Rare  ASSESSMENT: Ms. Porcaro is a 75 y.o. female presenting for consultation for left breast cancer.    Patient was oriented again about the pathology results. Surgical alternatives were discussed with patient including partial vs total mastectomy. Surgical technique and post operative care was discussed with patient. Risk of surgery was discussed with patient including but not limited to: wound infection, seroma, hematoma, brachial plexopathy, mondor's disease (thrombosis of small veins of breast), chronic wound pain, breast lymphedema, altered sensation to the nipple and cosmesis among others.   Patient with history of COPD.  She has been evaluated by pulmonology.  They assess that she has been controlled with current medications.  She was cleared for surgical management.  Patient has medical oncology evaluation on Monday.  Malignant neoplasm of upper-outer quadrant of left breast in female, estrogen receptor positive (CMS-HCC) [C50.412, Z17.0]  PLAN: 1. Radiofrequency tagged partial mastectomy with sentinel lymph node biopsy (19301, [id number]) 2. CBC, CMP 3. Avoid taking aspirin 5 days before the surgery 4. Expect medical oncology evaluation next week 5. Contact us if you have any concern.  Patient and her husband verbalized understanding, all questions were answered, and were agreeable with the plan outlined above.

## 2021-01-16 NOTE — H&P (View-Only) (Signed)
PATIENT PROFILE: Morgan Valdez is a 75 y.o. female who presents to the Clinic for consultation at the request of Dr. Ginette Pitman for evaluation of breast cancer.  PCP:  Azzie Glatter, MD  HISTORY OF PRESENT ILLNESS: Ms. Morgan Valdez reports she had her usual screening mammogram.  Did screening mammogram showed a small mass in the left breast.  This led to diagnostic mammogram and ultrasound.  The diagnostic mammogram ultrasound shows a 6 mm mass at the 2 o'clock position of the left breast.  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  Axillary lymph nodes appear normal on ultrasound.  I personally evaluated the images of the screening mammogram, diagnostic mammogram and ultrasound.  Family history of breast cancer: None Family history of other cancers: Multiple family members with different type of cancers Menarche: 75 years old Menopause: 75 years old Used OCP: Yes Used estrogen and progesterone therapy: Yes for 1 year History of Radiation to the chest: No Number of pregnancies: 4 Age of first pregnancy: 26 Previous breast biopsy: None  PROBLEM LIST: Problem List  Date Reviewed: 01/13/2021         Noted   COPD, mild , unspecified (CMS-HCC) 01/03/2020   Major depressive disorder, recurrent, in partial remission (CMS-HCC) 01/03/2020   Perennial allergic rhinitis 01/03/2020   Esophageal stenosis 01/03/2020   Elevated hemoglobin A1c 10/30/2018   Loose stools 10/30/2018   Depression, recurrent (CMS-HCC) 08/08/2018   Spinal stenosis of lumbar region with neurogenic claudication 11/10/2017   Stress bladder incontinence, female 04/08/2016   Chronic right-sided low back pain without sciatica 12/10/2015   Chronic superficial 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 08/06/2015   Allergic state Unknown   Depression Unknown   Barrett's esophagus Unknown   RLS (restless legs syndrome) 11/14/2014   Tobacco use Unknown      GENERAL REVIEW OF SYSTEMS:   General ROS:  negative for - chills, fatigue, fever, weight gain or weight loss Allergy and Immunology ROS: negative for - hives  Hematological and Lymphatic ROS: negative for - bleeding problems or bruising, negative for palpable nodes Endocrine ROS: negative for - heat or cold intolerance, hair changes Respiratory ROS: Positive for - cough, shortness of breath or wheezing Cardiovascular ROS: no chest pain or palpitations GI ROS: negative for nausea, vomiting, abdominal pain, diarrhea, constipation Musculoskeletal ROS: negative for - joint swelling or muscle pain Neurological ROS: negative for - confusion, syncope Dermatological ROS: negative for pruritus and rash Psychiatric: negative for anxiety, depression, difficulty sleeping and memory loss  MEDICATIONS: Current Outpatient Medications  Medication Sig Dispense Refill  . buPROPion (WELLBUTRIN SR) 150 MG SR tablet TAKE 1 TABLET(150 MG) BY MOUTH TWICE DAILY 180 tablet 0  . FUROsemide (LASIX) 20 MG tablet TAKE 1 TABLET(20 MG) BY MOUTH EVERY DAY AS NEEDED FOR SWELLING 30 tablet 0  . gabapentin (NEURONTIN) 100 MG capsule TAKE 2 CAPSULES(200 MG) BY MOUTH THREE TIMES DAILY 180 capsule 0  . omeprazole (PRILOSEC) 40 MG DR capsule TAKE 1 CAPSULE(40 MG) BY MOUTH EVERY DAY 90 capsule 1  . omeprazole (PRILOSEC) 40 MG DR capsule Take 40 mg by mouth once daily    . ondansetron (ZOFRAN) 4 MG tablet Take 1 tablet (4 mg total) by mouth every 8 (eight) hours as needed for Nausea 30 tablet 1  . tiotropium (SPIRIVA) 18 mcg inhalation capsule Place 1 capsule (18 mcg total) into inhaler and inhale once daily TO REPLACE ANORO DUE TO INSURANCE FORMULARY 30 capsule 11  . albuterol 90 mcg/actuation inhaler  Inhale 2 inhalations into the lungs every 6 (six) hours as needed for Wheezing (Patient not taking: Reported on 09/18/2020  ) 1 Inhaler 5  . montelukast (SINGULAIR) 10 mg tablet Take 1 tablet (10 mg total) by mouth once daily (Patient not taking: Reported on 01/13/2021  ) 90  tablet 1  . sodium, potassium, and magnesium (SUPREP) oral solution Take 1 Bottle by mouth as directed One kit contains 2 bottles.  Take both bottles at the times instructed by your provider. (Patient not taking: Reported on 01/13/2021  ) 354 mL 0   No current facility-administered medications for this visit.    ALLERGIES: Bacitracin and Other  PAST MEDICAL HISTORY: Past Medical History:  Diagnosis Date  . Adenomatous polyps   . Allergic state   . Anxiety   . Arthritis   . Barrett's esophagus   . Breast cancer (CMS-HCC)    left   . Cataract cortical, senile removed in  2018  . COPD (chronic obstructive pulmonary disease) (CMS-HCC)   . Depression   . Duodenitis 10/14/2015  . Emphysema of lung (CMS-HCC)   . Esophageal motility disorder 10/14/2015  . Gastritis 10/14/2015  . GERD (gastroesophageal reflux disease)   . Osteoporosis, post-menopausal   . Perennial allergic rhinitis   . Tobacco use     PAST SURGICAL HISTORY: Past Surgical History:  Procedure Laterality Date  . APPENDECTOMY    . CATARACT EXTRACTION  2018  . CHOLECYSTECTOMY    . COLONOSCOPY  11/2012   4 polyps, divertics  . COLONOSCOPY  04/06/2018   Tubular adenoma of the colon/Hyperplastic colon polyp/Repeat 21yr/MUS  . EGD  10/14/2015   Esophageal motility disorder/Gastritis/Duodenitis/No Repeat/MUS  . EGD  09/26/2018   GERD/ Abnormal esophageal motility/Repeat 115yrUS  . EGD  11/13/2019   Reflux esophagitis/Gastritis/No Repeat/TKT  . FRACTURE SURGERY    . HERNIA REPAIR       FAMILY HISTORY: Family History  Problem Relation Age of Onset  . Cancer Mother   . Alzheimer's disease Father   . Stroke Other        GF  . Cancer Other        Half sister     SOCIAL HISTORY: Social History   Socioeconomic History  . Marital status: Widowed    Spouse name: Not on file  . Number of children: Not on file  . Years of education: Not on file  . Highest education level: Not on file  Occupational History  .  Not on file  Tobacco Use  . Smoking status: Light Tobacco Smoker    Packs/day: 0.50    Years: 50.00    Pack years: 25.00    Types: Cigarettes  . Smokeless tobacco: Never Used  . Tobacco comment: Unable to tolerate Chantix.  Vaping Use  . Vaping Use: Never used  Substance and Sexual Activity  . Alcohol use: Not Currently    Alcohol/week: 0.0 standard drinks    Comment: "small glass of wine before bed each night"  . Drug use: Never  . Sexual activity: Not Currently    Birth control/protection: Post-menopausal  Other Topics Concern  . Not on file  Social History Narrative  . Not on file   Social Determinants of Health   Financial Resource Strain: Not on file  Food Insecurity: Not on file  Transportation Needs: Not on file    PHYSICAL EXAM: Vitals:   01/16/21 1028  BP: (!) 155/80  Pulse: 85   Body mass index is 23.49 kg/m. Weight:  68 kg (150 lb)   GENERAL: Alert, active, oriented x3  HEENT: Pupils equal reactive to light. Extraocular movements are intact. Sclera clear. Palpebral conjunctiva normal red color.Pharynx clear.  NECK: Supple with no palpable mass and no adenopathy.  LUNGS: Sound clear with no rales rhonchi or wheezes.  HEART: Regular rhythm S1 and S2 without murmur.  BREAST: breasts appear normal, no suspicious masses, no skin or nipple changes or axillary nodes.  ABDOMEN: Soft and depressible, nontender with no palpable mass, no hepatomegaly.  EXTREMITIES: Well-developed well-nourished symmetrical with no dependent edema.  NEUROLOGICAL: Awake alert oriented, facial expression symmetrical, moving all extremities.  REVIEW OF DATA: I have reviewed the following data today: No visits with results within 3 Month(s) from this visit.  Latest known visit with results is:  Appointment on 09/11/2020  Component Date Value  . WBC (White Blood Cell Co* 09/11/2020 7.9   . RBC (Red Blood Cell Coun* 09/11/2020 4.78   . Hemoglobin 09/11/2020 14.6   .  Hematocrit 09/11/2020 44.8   . MCV (Mean Corpuscular Vo* 09/11/2020 93.7   . MCH (Mean Corpuscular He* 09/11/2020 30.5   . MCHC (Mean Corpuscular H* 09/11/2020 32.6   . Platelet Count 09/11/2020 274   . RDW-CV (Red Cell Distrib* 09/11/2020 13.3   . MPV (Mean Platelet Volum* 09/11/2020 10.1   . Neutrophils 09/11/2020 4.60   . Lymphocytes 09/11/2020 2.47   . Monocytes 09/11/2020 0.61   . Eosinophils 09/11/2020 0.18   . Basophils 09/11/2020 0.04   . Neutrophil % 09/11/2020 58.0   . Lymphocyte % 09/11/2020 31.2   . Monocyte % 09/11/2020 7.7   . Eosinophil % 09/11/2020 2.3   . Basophil% 09/11/2020 0.5   . Immature Granulocyte % 09/11/2020 0.3   . Immature Granulocyte Cou* 09/11/2020 0.02   . Glucose 09/11/2020 102   . Sodium 09/11/2020 142   . Potassium 09/11/2020 4.6   . Chloride 09/11/2020 106   . Carbon Dioxide (CO2) 09/11/2020 32.2 (!)  . Urea Nitrogen (BUN) 09/11/2020 12   . Creatinine 09/11/2020 0.9   . Glomerular Filtration Ra* 09/11/2020 61   . Calcium 09/11/2020 9.2   . AST  09/11/2020 11   . ALT  09/11/2020 8   . Alk Phos (alkaline Phosp* 09/11/2020 66   . Albumin 09/11/2020 3.9   . Bilirubin, Total 09/11/2020 0.5   . Protein, Total 09/11/2020 6.4   . A/G Ratio 09/11/2020 1.6   . Hemoglobin A1C 09/11/2020 6.2 (!)  . Average Blood Glucose (C* 09/11/2020 131   . Cholesterol, Total 09/11/2020 200   . Triglyceride 09/11/2020 105   . HDL (High Density Lipopr* 09/11/2020 50.6   . LDL Calculated 09/11/2020 128   . VLDL Cholesterol 09/11/2020 21   . Cholesterol/HDL Ratio 09/11/2020 4.0   . Urine Albumin, Random 09/11/2020 <7   . Thyroid Stimulating Horm* 09/11/2020 0.860   . Color 09/11/2020 Yellow   . Clarity 09/11/2020 Clear   . Specific Gravity 09/11/2020 1.015   . pH, Urine 09/11/2020 7.0   . Protein, Urinalysis 09/11/2020 Negative   . Glucose, Urinalysis 09/11/2020 Negative   . Ketones, Urinalysis 09/11/2020 Negative   . Blood, Urinalysis 09/11/2020 Negative   .  Nitrite, Urinalysis 09/11/2020 Negative   . Leukocyte Esterase, Urin* 09/11/2020 Trace (!)  . White Blood Cells, Urina* 09/11/2020 0-3   . Red Blood Cells, Urinaly* 09/11/2020 None Seen   . Bacteria, Urinalysis 09/11/2020 Rare (!)  . Squamous Epithelial Cell* 09/11/2020 Rare  ASSESSMENT: Ms. Arcidiacono is a 75 y.o. female presenting for consultation for left breast cancer.    Patient was oriented again about the pathology results. Surgical alternatives were discussed with patient including partial vs total mastectomy. Surgical technique and post operative care was discussed with patient. Risk of surgery was discussed with patient including but not limited to: wound infection, seroma, hematoma, brachial plexopathy, mondor's disease (thrombosis of small veins of breast), chronic wound pain, breast lymphedema, altered sensation to the nipple and cosmesis among others.   Patient with history of COPD.  She has been evaluated by pulmonology.  They assess that she has been controlled with current medications.  She was cleared for surgical management.  Patient has medical oncology evaluation on Monday.  Malignant neoplasm of upper-outer quadrant of left breast in female, estrogen receptor positive (CMS-HCC) [C50.412, Z17.0]  PLAN: 1. Radiofrequency tagged partial mastectomy with sentinel lymph node biopsy (19301, [id number]) 2. CBC, CMP 3. Avoid taking aspirin 5 days before the surgery 4. Expect medical oncology evaluation next week 5. Contact us if you have any concern.  Patient and her husband verbalized understanding, all questions were answered, and were agreeable with the plan outlined above.

## 2021-01-19 ENCOUNTER — Other Ambulatory Visit: Payer: Self-pay | Admitting: General Surgery

## 2021-01-19 ENCOUNTER — Encounter: Payer: Self-pay | Admitting: *Deleted

## 2021-01-19 ENCOUNTER — Inpatient Hospital Stay: Payer: Medicare HMO | Attending: Internal Medicine | Admitting: Internal Medicine

## 2021-01-19 ENCOUNTER — Encounter: Payer: Self-pay | Admitting: Internal Medicine

## 2021-01-19 ENCOUNTER — Inpatient Hospital Stay: Payer: Medicare HMO

## 2021-01-19 ENCOUNTER — Other Ambulatory Visit: Payer: Self-pay

## 2021-01-19 DIAGNOSIS — F17210 Nicotine dependence, cigarettes, uncomplicated: Secondary | ICD-10-CM | POA: Insufficient documentation

## 2021-01-19 DIAGNOSIS — Z79899 Other long term (current) drug therapy: Secondary | ICD-10-CM | POA: Diagnosis not present

## 2021-01-19 DIAGNOSIS — C50412 Malignant neoplasm of upper-outer quadrant of left female breast: Secondary | ICD-10-CM

## 2021-01-19 DIAGNOSIS — Z808 Family history of malignant neoplasm of other organs or systems: Secondary | ICD-10-CM | POA: Insufficient documentation

## 2021-01-19 DIAGNOSIS — Z833 Family history of diabetes mellitus: Secondary | ICD-10-CM | POA: Diagnosis not present

## 2021-01-19 DIAGNOSIS — M1990 Unspecified osteoarthritis, unspecified site: Secondary | ICD-10-CM | POA: Insufficient documentation

## 2021-01-19 DIAGNOSIS — K219 Gastro-esophageal reflux disease without esophagitis: Secondary | ICD-10-CM | POA: Diagnosis not present

## 2021-01-19 DIAGNOSIS — I8390 Asymptomatic varicose veins of unspecified lower extremity: Secondary | ICD-10-CM | POA: Diagnosis not present

## 2021-01-19 DIAGNOSIS — Z800 Family history of malignant neoplasm of digestive organs: Secondary | ICD-10-CM | POA: Insufficient documentation

## 2021-01-19 DIAGNOSIS — Z170 Estrogen receptor positive status [ER+]: Secondary | ICD-10-CM | POA: Insufficient documentation

## 2021-01-19 DIAGNOSIS — C50812 Malignant neoplasm of overlapping sites of left female breast: Secondary | ICD-10-CM | POA: Diagnosis not present

## 2021-01-19 LAB — COMPREHENSIVE METABOLIC PANEL
ALT: 11 U/L (ref 0–44)
AST: 13 U/L — ABNORMAL LOW (ref 15–41)
Albumin: 4.1 g/dL (ref 3.5–5.0)
Alkaline Phosphatase: 59 U/L (ref 38–126)
Anion gap: 11 (ref 5–15)
BUN: 17 mg/dL (ref 8–23)
CO2: 27 mmol/L (ref 22–32)
Calcium: 9.1 mg/dL (ref 8.9–10.3)
Chloride: 99 mmol/L (ref 98–111)
Creatinine, Ser: 0.97 mg/dL (ref 0.44–1.00)
GFR, Estimated: >60 mL/min (ref >60)
Glucose, Bld: 97 mg/dL (ref 70–99)
Potassium: 4.4 mmol/L (ref 3.5–5.1)
Sodium: 137 mmol/L (ref 135–145)
Total Bilirubin: 0.6 mg/dL (ref 0.3–1.2)
Total Protein: 7.2 g/dL (ref 6.5–8.1)

## 2021-01-19 LAB — CBC WITH DIFFERENTIAL/PLATELET
Abs Immature Granulocytes: 0.04 10*3/uL (ref 0.00–0.07)
Basophils Absolute: 0 10*3/uL (ref 0.0–0.1)
Basophils Relative: 0 %
Eosinophils Absolute: 0.1 10*3/uL (ref 0.0–0.5)
Eosinophils Relative: 2 %
HCT: 48.8 % — ABNORMAL HIGH (ref 36.0–46.0)
Hemoglobin: 16.3 g/dL — ABNORMAL HIGH (ref 12.0–15.0)
Immature Granulocytes: 0 %
Lymphocytes Relative: 30 %
Lymphs Abs: 2.8 10*3/uL (ref 0.7–4.0)
MCH: 31 pg (ref 26.0–34.0)
MCHC: 33.4 g/dL (ref 30.0–36.0)
MCV: 93 fL (ref 80.0–100.0)
Monocytes Absolute: 0.6 10*3/uL (ref 0.1–1.0)
Monocytes Relative: 6 %
Neutro Abs: 5.7 10*3/uL (ref 1.7–7.7)
Neutrophils Relative %: 62 %
Platelets: 255 10*3/uL (ref 150–400)
RBC: 5.25 MIL/uL — ABNORMAL HIGH (ref 3.87–5.11)
RDW: 13.2 % (ref 11.5–15.5)
WBC: 9.3 10*3/uL (ref 4.0–10.5)
nRBC: 0 % (ref 0.0–0.2)

## 2021-01-19 LAB — APTT: aPTT: 27 seconds (ref 24–36)

## 2021-01-19 NOTE — Assessment & Plan Note (Addendum)
#  Clinical stage I ER/PR positive HER-2 negative left breast cancer-invasive mammary carcinoma.   # I had a long discussion with the patient in general regarding the treatment options of breast cancer including-surgery; adjuvant radiation; role of adjuvant systemic therapy including-chemotherapy antihormone therapy.   # Patient will likely need lumpectomy with sentinel lymph node evaluation; followed by radiation. Decision regarding chemotherapy based on final surgical pathology/gene assay. Patient will benefit from antihormone therapy.  I discussed the potential benefits of each option; and also potential downsides in detail.  #Cancer screening : colonoscopy [since 56 years; again aug 2022];  # Genetics: Given multiple malignancies recommended evaluation.  Referred next visit  # Smoking: Active smoker; recommend smoking cessation.  Discuss lung cancer screening program/next visit  Thank you Dr.Hande for allowing me to participate in the care of your pleasant patient. Please do not hesitate to contact me with questions or concerns in the interim.  Surgery planned on 03/30.   # DISPOSITION:  # labs today- cbc/cmp/PT/PTT-please order # follow up on April 12th- MD; No labs; Dr. Jacinto Reap

## 2021-01-19 NOTE — Progress Notes (Signed)
Met patient and her friend today during her medical oncology consult with Dr. Jacinto Reap..  Patient had questions in regards to the date of her Covid 56 test.  Encouraged to discuss with Judeen Hammans, in Dr. Deniece Ree office since they scheduled the appointment.  She is agreeable.  She was encouraged to call with any questions or needs.

## 2021-01-19 NOTE — Progress Notes (Signed)
Given her health issues she has had more panic attacks within the last few weeks.

## 2021-01-19 NOTE — Progress Notes (Signed)
[facility] [place] NOTE  Patient Care Team: Tracie Harrier, MD as PCP - General (Internal Medicine) Theodore Demark, RN as Oncology Nurse Navigator  CHIEF COMPLAINTS/PURPOSE OF CONSULTATION: Breast cancer  #  Oncology History Overview Note  #  BREAST, LEFT 2:00; ULTRASOUND-GUIDED BIOPSY:  - INVASIVE MAMMARY CARCINOMA WITH FEATURES SUGGESTIVE OF SOLID  PSEUDOPAPILLARY CARCINOMA; ER >90%; PR > 90%; he 2 Neu-NEG [Dr.Cinton]     Carcinoma of overlapping sites of left breast in female, estrogen receptor positive ([place])  01/19/2021 Initial Diagnosis   Carcinoma of overlapping sites of left breast in female, estrogen receptor positive ([place])   01/19/2021 Cancer Staging   Staging form: Breast, AJCC 8th Edition - Clinical: Stage IA (cT1a, cN0, cM0, G2, ER+, PR+, HER2-) - Signed by Cammie Sickle, MD on 01/19/2021 Histologic grading system: 3 grade system      HISTORY OF PRESENTING ILLNESS:  Morgan Valdez 75 y.o.  female female with no prior history of breast cancer/or malignancies has been referred to [place] for further evaluation recommendations for new diagnosis of breast cancer.   Patient states she was found to have an abnormal screening mammogram which led to diagnostic mammogram/ultrasound/followed by biopsy-as summarized above.   Patient has chronic back pain.  Not any worse.  Neck no new lumps or bumps.  Family history of breast cancer: none Family history of other cancers: Multiple family members with malignancy-rectal /colon cancer Used estrogen and progesterone therapy: 2 years; in 1980s History of Radiation to the chest: none Number of pregnancies: Previous biopsy: none   Review of Systems  Constitutional: Negative for chills, diaphoresis, fever, malaise/fatigue and weight loss.  HENT: Negative for nosebleeds and sore throat.   Eyes: Negative for double vision.  Respiratory: Negative for cough, hemoptysis, sputum production, shortness of breath and  wheezing.   Cardiovascular: Negative for chest pain, palpitations, orthopnea and leg swelling.  Gastrointestinal: Negative for abdominal pain, blood in stool, constipation, diarrhea, heartburn, melena, nausea and vomiting.  Genitourinary: Negative for dysuria, frequency and urgency.  Musculoskeletal: Positive for back pain. Negative for joint pain.  Skin: Negative.  Negative for itching and rash.  Neurological: Negative for dizziness, tingling, focal weakness, weakness and headaches.  Endo/Heme/Allergies: Does not bruise/bleed easily.  Psychiatric/Behavioral: Negative for depression. The patient is not nervous/anxious and does not have insomnia.      MEDICAL HISTORY:  Past Medical History:  Diagnosis Date  . Allergic state   . Arthritis   . Barrett's esophagus   . Chronic cough   . COPD (chronic obstructive pulmonary disease) ([place])   . Depression   . Duodenitis   . Esophageal motility disorder   . Gastritis   . GERD (gastroesophageal reflux disease)   . Headache    migraines - 3-4x/yr  . Tobacco abuse disorder   . Varicose veins of legs     SURGICAL HISTORY: Past Surgical History:  Procedure Laterality Date  . APPENDECTOMY    . CATARACT EXTRACTION W/PHACO Right 01/03/2017   Procedure: CATARACT EXTRACTION PHACO AND INTRAOCULAR LENS PLACEMENT ([place])  Right;  Surgeon: Ronnell Freshwater, MD;  Location: [place];  Service: Ophthalmology;  Laterality: Right;  . CATARACT EXTRACTION W/PHACO Left 01/17/2017   Procedure: CATARACT EXTRACTION PHACO AND INTRAOCULAR LENS PLACEMENT (IOC);  Surgeon: Ronnell Freshwater, MD;  Location: [place];  Service: Ophthalmology;  Laterality: Left;  left  . CHOLECYSTECTOMY    . COLONOSCOPY WITH PROPOFOL N/A 04/06/2018   Procedure: COLONOSCOPY WITH PROPOFOL;  Surgeon:  Lollie Sails, MD;  Location: [facility] ENDOSCOPY;  Service: Endoscopy;  Laterality: N/A;  . ESOPHAGOGASTRODUODENOSCOPY N/A 09/26/2018   Procedure:  ESOPHAGOGASTRODUODENOSCOPY (EGD);  Surgeon: Lollie Sails, MD;  Location: [facility] ENDOSCOPY;  Service: Endoscopy;  Laterality: N/A;  . ESOPHAGOGASTRODUODENOSCOPY (EGD) WITH PROPOFOL N/A 10/14/2015   Procedure: ESOPHAGOGASTRODUODENOSCOPY (EGD) WITH PROPOFOL;  Surgeon: Lollie Sails, MD;  Location: [facility] ENDOSCOPY;  Service: Endoscopy;  Laterality: N/A;  . EYE SURGERY    . HERNIA REPAIR      SOCIAL HISTORY: Social History   Socioeconomic History  . Marital status: Widowed    Spouse name: Not on file  . Number of children: Not on file  . Years of education: Not on file  . Highest education level: Not on file  Occupational History  . Not on file  Tobacco Use  . Smoking status: Current Every Day Smoker    Packs/day: 0.50    Years: 50.00    Pack years: 25.00  . Smokeless tobacco: Never Used  Vaping Use  . Vaping Use: Never used  Substance and Sexual Activity  . Alcohol use: Yes    Comment: rare - Holidays  . Drug use: No  . Sexual activity: Not on file  Other Topics Concern  . 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 Cancer Mother        esophagus, spine. adrenal gland; lung.   . Dementia Father   . Cancer Sister        colon cancer- 5s to liver.   . Cancer Maternal Grandmother        brain cancer  . Diabetes Maternal Grandfather   . Stroke Maternal Grandfather   . Rectal cancer Daughter        at 63 years; survived.   . Breast cancer Neg Hx     ALLERGIES:  is allergic to bacitracin and other.  MEDICATIONS:  Current Outpatient  Medications  Medication Sig Dispense Refill  . buPROPion (WELLBUTRIN SR) 150 MG 12 hr tablet Take 150 mg by mouth daily.    . cetirizine (ZYRTEC) 10 MG tablet Take 10 mg by mouth daily as needed for allergies.    . fluticasone (FLONASE) 50 MCG/ACT nasal spray Place 2 sprays into both nostrils daily as needed for allergies.    . furosemide (LASIX) 20 MG tablet Take 20 mg by mouth as needed for fluid.     Marland Kitchen omeprazole (PRILOSEC) 40 MG capsule Take 40 mg by mouth in the morning and at bedtime.    . ondansetron (ZOFRAN) 4 MG tablet Take 4 mg by mouth 2 (two) times daily as needed for nausea/vomiting.  0  . peppermint oil liquid Take 1 capsule by mouth in the morning, at noon, and at bedtime. PEPPERMINT OIL WITH GINGER & FENNEL    . Polyethyl Glycol-Propyl Glycol (SYSTANE) 0.4-0.3 % SOLN Place 1-2 drops into both eyes 3 (three) times daily as needed (dry/irritated eyes).    Marland Kitchen senna-docusate (SENOKOT-S) 8.6-50 MG tablet Take 1 tablet by mouth daily. (Patient  taking differently: Take 1 tablet by mouth daily as needed (constipation).) 30 tablet 0  . SPIRIVA HANDIHALER 18 MCG inhalation capsule Place 1 capsule into inhaler and inhale in the morning.     No current facility-administered medications for this visit.      Marland Kitchen  PHYSICAL EXAMINATION: ECOG PERFORMANCE STATUS: 0 - Asymptomatic  Vitals:   01/19/21 1102  BP: 136/76  Pulse: 86  Resp: 16  Temp: 97.8 F (36.6 C)  SpO2: 98%   Filed Weights   01/19/21 1102  Weight: 151 lb (68.5 kg)    Physical Exam HENT:     Head: Normocephalic and atraumatic.     Mouth/Throat:     Pharynx: No oropharyngeal exudate.  Eyes:     Pupils: Pupils are equal, round, and reactive to light.  Cardiovascular:     Rate and Rhythm: Normal rate and regular rhythm.  Pulmonary:     Effort: No respiratory distress.     Breath sounds: No wheezing.  Abdominal:     General: Bowel sounds are normal. There is no distension.     Palpations: Abdomen is soft. There  is no mass.     Tenderness: There is no abdominal tenderness. There is no guarding or rebound.  Musculoskeletal:        General: No tenderness. Normal range of motion.     Cervical back: Normal range of motion and neck supple.  Skin:    General: Skin is warm.  Neurological:     Mental Status: She is alert and oriented to person, place, and time.  Psychiatric:        Mood and Affect: Affect normal.      LABORATORY DATA:  I have reviewed the data as listed Lab Results  Component Value Date   WBC 9.3 01/19/2021   HGB 16.3 (H) 01/19/2021   HCT 48.8 (H) 01/19/2021   MCV 93.0 01/19/2021   PLT 255 01/19/2021   Recent Labs    03/02/20 1050 01/19/21 1153  NA 138 137  K 4.5 4.4  CL 104 99  CO2 24 27  GLUCOSE 105* 97  BUN 11 17  CREATININE 0.93 0.97  CALCIUM 8.8* 9.1  GFRNONAA >60 >60  GFRAA >60  --   PROT 6.8 7.2  ALBUMIN 3.9 4.1  AST 13* 13*  ALT 12 11  ALKPHOS 61 59  BILITOT 0.7 0.6    RADIOGRAPHIC STUDIES: I have personally reviewed the radiological images as listed and agreed with the findings in the report. US BREAST LTD UNI LEFT INC AXILLA  Result Date: 12/24/2020 CLINICAL DATA:  Left breast possible mass seen on most recent screening mammography. EXAM: DIGITAL DIAGNOSTIC UNILATERAL LEFT MAMMOGRAM WITH TOMOSYNTHESIS AND CAD; ULTRASOUND LEFT BREAST LIMITED TECHNIQUE: Left digital diagnostic mammography and breast tomosynthesis was performed. The images were evaluated with computer-aided detection.; Targeted ultrasound examination of the left breast was performed COMPARISON:  Previous exam(s). ACR Breast Density Category c: The breast tissue is heterogeneously dense, which may obscure small masses. FINDINGS: Additional mammographic views of the left breast demonstrate irregular 6 mm mass in the left breast upper outer quadrant, far posterior depth. Targeted left breast ultrasound demonstrates 2 o'clock 12 cm from the nipple hypo to isoechoic slightly irregular mass which  measures 0.5 x 0.5 x 0.6 cm. This subtle finding likely corresponds to the mammographically seen mass. There is no evidence of left axillary lymphadenopathy. IMPRESSION: Left breast 2 o'clock suspicious mass, for which ultrasound-guided core needle biopsy is recommended. RECOMMENDATION: One  site ultrasound-guided core needle biopsy of the left breast. Please assure mammographic/sonographic correlation on the post biopsy mammogram. If such correlation could not be established, consider stereotactic core needle biopsy for the mammographically seen mass. I have discussed the findings and recommendations with the patient. If applicable, a reminder letter will be sent to the patient regarding the next appointment. BI-RADS CATEGORY  4: Suspicious. Electronically Signed   By: Fidela Salisbury M.D.   On: 12/24/2020 11:22   MM DIAG BREAST TOMO UNI LEFT  Result Date: 12/24/2020 CLINICAL DATA:  Left breast possible mass seen on most recent screening mammography. EXAM: DIGITAL DIAGNOSTIC UNILATERAL LEFT MAMMOGRAM WITH TOMOSYNTHESIS AND CAD; ULTRASOUND LEFT BREAST LIMITED TECHNIQUE: Left digital diagnostic mammography and breast tomosynthesis was performed. The images were evaluated with computer-aided detection.; Targeted ultrasound examination of the left breast was performed COMPARISON:  Previous exam(s). ACR Breast Density Category c: The breast tissue is heterogeneously dense, which may obscure small masses. FINDINGS: Additional mammographic views of the left breast demonstrate irregular 6 mm mass in the left breast upper outer quadrant, far posterior depth. Targeted left breast ultrasound demonstrates 2 o'clock 12 cm from the nipple hypo to isoechoic slightly irregular mass which measures 0.5 x 0.5 x 0.6 cm. This subtle finding likely corresponds to the mammographically seen mass. There is no evidence of left axillary lymphadenopathy. IMPRESSION: Left breast 2 o'clock suspicious mass, for which ultrasound-guided  core needle biopsy is recommended. RECOMMENDATION: One site ultrasound-guided core needle biopsy of the left breast. Please assure mammographic/sonographic correlation on the post biopsy mammogram. If such correlation could not be established, consider stereotactic core needle biopsy for the mammographically seen mass. I have discussed the findings and recommendations with the patient. If applicable, a reminder letter will be sent to the patient regarding the next appointment. BI-RADS CATEGORY  4: Suspicious. Electronically Signed   By: Fidela Salisbury M.D.   On: 12/24/2020 11:22   MM CLIP PLACEMENT LEFT  Result Date: 01/02/2021 CLINICAL DATA:  Post procedure mammogram for clip placement. EXAM: DIAGNOSTIC LEFT MAMMOGRAM POST ULTRASOUND BIOPSY COMPARISON:  Previous exam(s). FINDINGS: Mammographic images were obtained following ultrasound guided biopsy of a mass in the left breast at 2 o'clock. The biopsy marking clip is in expected position at the site of biopsy. IMPRESSION: Appropriate positioning of the Q shaped biopsy marking clip at the site of biopsy in the left breast at 2 o'clock. Final Assessment: Post Procedure Mammograms for Marker Placement Electronically Signed   By: Audie Pinto M.D.   On: 01/02/2021 09:30   [place] LT BREAST BX W LOC DEV 1ST LESION IMG BX SPEC US GUIDE  Addendum Date: 01/06/2021   ADDENDUM REPORT: 01/06/2021 12:37 ADDENDUM: PATHOLOGY revealed: A. BREAST, LEFT 2:00; ULTRASOUND-GUIDED BIOPSY: - INVASIVE MAMMARY CARCINOMA WITH FEATURES SUGGESTIVE OF SOLID PSEUDOPAPILLARY CARCINOMA. Size of invasive carcinoma: 5 mm in this sample. Grade 2. Ductal carcinoma in situ: Not identified. Lymphovascular invasion: Not identified. Pathology results are CONCORDANT with imaging findings, per Dr. Audie Pinto. Pathology results and recommendations below were discussed with patient by telephone on 01/06/2021. Patient reported biopsy site within normal limits with slight tenderness at the  site, and no significant bruising. Post biopsy care instructions were reviewed, questions were answered and my direct phone number was provided to patient. Patient was instructed to call [facility] if any concerns or questions arise related to the biopsy. Recommend surgical consultation: Request for surgical consultation relayed to Al Pimple RN and Tanya Nones RN at [facility]  by Electa Sniff RN on 01/06/2021. Pathology results reported by Electa Sniff RN on 01/06/2021. Electronically Signed   By: Audie Pinto M.D.   On: 01/06/2021 12:37   Result Date: 01/06/2021 CLINICAL DATA:  75 year old female presenting for biopsy of a left breast mass. EXAM: ULTRASOUND GUIDED LEFT BREAST CORE NEEDLE BIOPSY COMPARISON:  Previous exam(s). PROCEDURE: I met with the patient and we discussed the procedure of ultrasound-guided biopsy, including benefits and alternatives. We discussed the high likelihood of a successful procedure. We discussed the risks of the procedure, including infection, bleeding, tissue injury, clip migration, and inadequate sampling. Informed written consent was given. The usual time-out protocol was performed immediately prior to the procedure. Lesion quadrant: Upper outer quadrant Using sterile technique and 1% Lidocaine as local anesthetic, under direct ultrasound visualization, a 14 gauge spring-loaded device was used to perform biopsy of a left breast mass at 2 o'clock using a lateral approach. At the conclusion of the procedure a Q tissue marker clip was deployed into the biopsy cavity. Follow up 2 view mammogram was performed and dictated separately. IMPRESSION: Ultrasound guided biopsy of a left breast mass at 2 o'clock. No apparent complications. Electronically Signed: By: Audie Pinto M.D. On: 01/02/2021 09:30    ASSESSMENT & PLAN:   Carcinoma of overlapping sites of left breast in female, estrogen receptor positive ([place]) #Clinical stage I ER/PR positive  HER-2 negative left breast cancer-invasive mammary carcinoma.   # I had a long discussion with the patient in general regarding the treatment options of breast cancer including-surgery; adjuvant radiation; role of adjuvant systemic therapy including-chemotherapy antihormone therapy.   # Patient will likely need lumpectomy with sentinel lymph node evaluation; followed by radiation. Decision regarding chemotherapy based on final surgical pathology/gene assay. Patient will benefit from antihormone therapy.  I discussed the potential benefits of each option; and also potential downsides in detail.  #Cancer screening : colonoscopy [since 56 years; again aug 2022];  # Genetics: Given multiple malignancies recommended evaluation.  Referred next visit  # Smoking: Active smoker; recommend smoking cessation.  Discuss lung cancer screening program/next visit  Thank you Dr.Hande for allowing me to participate in the care of your pleasant patient. Please do not hesitate to contact me with questions or concerns in the interim.  Surgery planned on 03/30.   # DISPOSITION:  # labs today- cbc/cmp/PT/PTT-please order # follow up on April 12th- MD; No labs; Dr. B  All questions were answered. The patient/family knows to call the clinic with any problems, questions or concerns.    Cammie Sickle, MD 01/19/2021 12:29 PM

## 2021-01-20 ENCOUNTER — Other Ambulatory Visit: Payer: Self-pay | Admitting: General Surgery

## 2021-01-20 DIAGNOSIS — C50412 Malignant neoplasm of upper-outer quadrant of left female breast: Secondary | ICD-10-CM

## 2021-01-21 ENCOUNTER — Other Ambulatory Visit: Payer: Self-pay

## 2021-01-21 ENCOUNTER — Other Ambulatory Visit
Admission: RE | Admit: 2021-01-21 | Discharge: 2021-01-21 | Disposition: A | Discharge status: Home / Self Care | Payer: Medicare HMO | Source: Ambulatory Visit | Attending: General Surgery | Admitting: General Surgery

## 2021-01-21 HISTORY — DX: Prediabetes: R73.03

## 2021-01-21 NOTE — Patient Instructions (Signed)
Your procedure is scheduled on: Wednesday January 28, 2021. Report to Day Surgery inside [facility] 2nd floor (stop by Admissions desk first before getting on Elevator). To find out your arrival time please call [phone] between 1PM - 3PM on Tuesday January 27, 2021.  Remember: Instructions that are not followed completely may result in serious medical risk,  up to and including death, or upon the discretion of your surgeon and anesthesiologist your  surgery may need to be rescheduled.     _X__ 1. Do not eat food or drink fluids after midnight the night before your procedure.                 No chewing gum or hard candies.   __X__2.  On the morning of surgery brush your teeth with toothpaste and water, you                may rinse your mouth with mouthwash if you wish.  Do not swallow any toothpaste of mouthwash.     _X__ 3.  No Alcohol for 24 hours before or after surgery.   _X__ 4.  Do Not Smoke or use e-cigarettes For 24 Hours Prior to Your Surgery.                 Do not use any chewable tobacco products for at least 6 hours prior to                 Surgery.  _X__  5.  Do not use any recreational drugs (marijuana, cocaine, heroin, ecstasy, MDMA or other)                For at least one week prior to your surgery.  Combination of these drugs with anesthesia                May have life threatening results.  __X__ 6.  Notify your doctor if there is any change in your medical condition      (cold, fever, infections).     Do not wear jewelry, make-up, hairpins, clips or nail polish. Do not wear lotions, powders, or perfumes.  Do not shave 48 hours prior to surgery.  Do not bring valuables to the hospital.    [facility] is not responsible for any belongings or valuables.  Contacts, dentures or bridgework may not be worn into surgery. Leave your suitcase in the car. After surgery it may be brought to your room. For patients admitted to the hospital,  discharge time is determined by your treatment team.   Patients discharged the day of surgery will not be allowed to drive home.   Make arrangements for someone to be with you for the first 24 hours of your Same Day Discharge.   __X__ Take these medicines the morning of surgery with A SIP OF WATER:    1. omeprazole (PRILOSEC) 40 MG  2. sertraline (ZOLOFT) 50 MG  3. cetirizine (ZYRTEC) 10 MG  4. ALPRAZolam (XANAX) 0.25 MG if needed  5. bgabapentin (NEURONTIN) 100 MG if needed  6.  ____ Fleet Enema (as directed)   __X__ Use CHG Soap (or wipes) as directed  ____ Use Benzoyl Peroxide Gel as instructed  __X__ Use inhalers on the day of surgery  SPIRIVA HANDIHALER 18 MCG inhalation   ____ Stop metformin 2 days prior to surgery    ____ Take 1/2 of usual insulin dose the night before surgery. No insulin the morning  of surgery.   __X__ Stop Anti-inflammatories such as Ibuprofen, Aleve, Advil, naproxen, aspirin and or BC powders.    __X__ Stop supplements until after surgery. peppermint oil    __X__ Do not start any herbal supplements before your procedure.    If you have any questions regarding your pre-procedure instructions,  Please call Pre-admit Testing at [phone].

## 2021-01-22 ENCOUNTER — Ambulatory Visit
Admission: RE | Admit: 2021-01-22 | Discharge: 2021-01-22 | Disposition: A | Discharge status: Home / Self Care | Payer: Medicare HMO | Source: Ambulatory Visit | Attending: General Surgery | Admitting: General Surgery

## 2021-01-22 ENCOUNTER — Other Ambulatory Visit: Payer: Self-pay | Admitting: General Surgery

## 2021-01-22 DIAGNOSIS — C50412 Malignant neoplasm of upper-outer quadrant of left female breast: Secondary | ICD-10-CM | POA: Diagnosis not present

## 2021-01-22 DIAGNOSIS — C50912 Malignant neoplasm of unspecified site of left female breast: Secondary | ICD-10-CM | POA: Diagnosis not present

## 2021-01-23 ENCOUNTER — Other Ambulatory Visit
Admission: RE | Admit: 2021-01-23 | Discharge: 2021-01-23 | Disposition: A | Discharge status: Home / Self Care | Payer: Medicare HMO | Source: Ambulatory Visit | Attending: General Surgery | Admitting: General Surgery

## 2021-01-23 ENCOUNTER — Other Ambulatory Visit: Payer: Self-pay

## 2021-01-23 ENCOUNTER — Other Ambulatory Visit: Payer: Medicare HMO

## 2021-01-23 DIAGNOSIS — Z20822 Contact with and (suspected) exposure to covid-19: Secondary | ICD-10-CM | POA: Diagnosis not present

## 2021-01-23 DIAGNOSIS — Z01818 Encounter for other preprocedural examination: Secondary | ICD-10-CM | POA: Diagnosis not present

## 2021-01-23 LAB — SARS CORONAVIRUS 2 (TAT 6-24 HRS): SARS Coronavirus 2: NEGATIVE

## 2021-01-27 MED ORDER — CEFAZOLIN SODIUM-DEXTROSE 2-4 GM/100ML-% IV SOLN
2.0000 g | INTRAVENOUS | Status: AC
  Administered 2021-01-28: 2 g via INTRAVENOUS

## 2021-01-27 MED ORDER — ORAL CARE MOUTH RINSE: 15.0000 mL | Freq: Once | OROMUCOSAL | Status: AC

## 2021-01-27 MED ORDER — LACTATED RINGERS IV SOLN: INTRAVENOUS | Status: DC

## 2021-01-27 MED ORDER — CHLORHEXIDINE GLUCONATE 0.12 % MT SOLN: 15.0000 mL | Freq: Once | OROMUCOSAL | Status: AC

## 2021-01-28 ENCOUNTER — Encounter
Admission: RE | Disposition: A | Discharge status: Home / Self Care | Payer: Self-pay | Source: Home / Self Care | Attending: General Surgery

## 2021-01-28 ENCOUNTER — Ambulatory Visit: Payer: Medicare HMO | Admitting: Anesthesiology

## 2021-01-28 ENCOUNTER — Ambulatory Visit
Admission: RE | Admit: 2021-01-28 | Discharge: 2021-01-28 | Disposition: A | Discharge status: Home / Self Care | Payer: Medicare HMO | Attending: General Surgery | Admitting: General Surgery

## 2021-01-28 ENCOUNTER — Ambulatory Visit
Admission: RE | Admit: 2021-01-28 | Discharge: 2021-01-28 | Disposition: A | Discharge status: Home / Self Care | Payer: Medicare HMO | Source: Ambulatory Visit | Attending: General Surgery | Admitting: General Surgery

## 2021-01-28 ENCOUNTER — Other Ambulatory Visit: Payer: Self-pay

## 2021-01-28 DIAGNOSIS — F17210 Nicotine dependence, cigarettes, uncomplicated: Secondary | ICD-10-CM | POA: Insufficient documentation

## 2021-01-28 DIAGNOSIS — J449 Chronic obstructive pulmonary disease, unspecified: Secondary | ICD-10-CM | POA: Diagnosis not present

## 2021-01-28 DIAGNOSIS — Z79899 Other long term (current) drug therapy: Secondary | ICD-10-CM | POA: Insufficient documentation

## 2021-01-28 DIAGNOSIS — K219 Gastro-esophageal reflux disease without esophagitis: Secondary | ICD-10-CM | POA: Diagnosis not present

## 2021-01-28 DIAGNOSIS — R7303 Prediabetes: Secondary | ICD-10-CM | POA: Diagnosis not present

## 2021-01-28 DIAGNOSIS — C50412 Malignant neoplasm of upper-outer quadrant of left female breast: Secondary | ICD-10-CM

## 2021-01-28 DIAGNOSIS — Z170 Estrogen receptor positive status [ER+]: Secondary | ICD-10-CM

## 2021-01-28 DIAGNOSIS — I10 Essential (primary) hypertension: Secondary | ICD-10-CM | POA: Diagnosis not present

## 2021-01-28 DIAGNOSIS — C50912 Malignant neoplasm of unspecified site of left female breast: Secondary | ICD-10-CM | POA: Diagnosis not present

## 2021-01-28 HISTORY — PX: BREAST LUMPECTOMY,RADIO FREQ LOCALIZER,AXILLARY SENTINEL LYMPH NODE BIOPSY: SHX6900

## 2021-01-28 SURGERY — BREAST LUMPECTOMY,RADIO FREQ LOCALIZER,AXILLARY SENTINEL LYMPH NODE BIOPSY
Anesthesia: General | Laterality: Left

## 2021-01-28 MED ORDER — HYDROCODONE-ACETAMINOPHEN 5-325 MG PO TABS: 1.0000 | ORAL_TABLET | ORAL | 0 refills | Status: AC | PRN

## 2021-01-28 MED ORDER — BUPIVACAINE-EPINEPHRINE (PF) 0.5% -1:200000 IJ SOLN
INTRAMUSCULAR | Status: DC | PRN
  Administered 2021-01-28: 30 mL

## 2021-01-28 MED ORDER — HYDROCODONE-ACETAMINOPHEN 5-325 MG PO TABS
ORAL_TABLET | ORAL | Status: AC
  Filled 2021-01-28: qty 1

## 2021-01-28 MED ORDER — HYDROCODONE-ACETAMINOPHEN 5-325 MG PO TABS
1.0000 | ORAL_TABLET | ORAL | Status: DC | PRN
  Administered 2021-01-28: 1 via ORAL

## 2021-01-28 MED ORDER — FENTANYL CITRATE (PF) 100 MCG/2ML IJ SOLN
INTRAMUSCULAR | Status: AC
  Filled 2021-01-28: qty 2

## 2021-01-28 MED ORDER — CHLORHEXIDINE GLUCONATE 0.12 % MT SOLN
OROMUCOSAL | Status: AC
  Administered 2021-01-28: 15 mL via OROMUCOSAL
  Filled 2021-01-28: qty 15

## 2021-01-28 MED ORDER — ONDANSETRON HCL 4 MG/2ML IJ SOLN: 4.0000 mg | Freq: Once | INTRAMUSCULAR | Status: DC | PRN

## 2021-01-28 MED ORDER — CEFAZOLIN SODIUM-DEXTROSE 2-4 GM/100ML-% IV SOLN
INTRAVENOUS | Status: AC
  Filled 2021-01-28: qty 100

## 2021-01-28 MED ORDER — LIDOCAINE HCL (CARDIAC) PF 100 MG/5ML IV SOSY
PREFILLED_SYRINGE | INTRAVENOUS | Status: DC | PRN
  Administered 2021-01-28: 80 mg via INTRAVENOUS

## 2021-01-28 MED ORDER — DEXAMETHASONE SODIUM PHOSPHATE 10 MG/ML IJ SOLN
INTRAMUSCULAR | Status: DC | PRN
  Administered 2021-01-28: 10 mg via INTRAVENOUS

## 2021-01-28 MED ORDER — BUPIVACAINE-EPINEPHRINE (PF) 0.5% -1:200000 IJ SOLN
INTRAMUSCULAR | Status: AC
  Filled 2021-01-28: qty 30

## 2021-01-28 MED ORDER — ONDANSETRON HCL 4 MG/2ML IJ SOLN
INTRAMUSCULAR | Status: DC | PRN
  Administered 2021-01-28: 4 mg via INTRAVENOUS

## 2021-01-28 MED ORDER — DEXAMETHASONE SODIUM PHOSPHATE 10 MG/ML IJ SOLN
INTRAMUSCULAR | Status: AC
  Filled 2021-01-28: qty 1

## 2021-01-28 MED ORDER — MIDAZOLAM HCL 2 MG/2ML IJ SOLN
INTRAMUSCULAR | Status: DC | PRN
  Administered 2021-01-28: 2 mg via INTRAVENOUS

## 2021-01-28 MED ORDER — FENTANYL CITRATE (PF) 100 MCG/2ML IJ SOLN
INTRAMUSCULAR | Status: AC
  Administered 2021-01-28: 25 ug via INTRAVENOUS
  Filled 2021-01-28: qty 2

## 2021-01-28 MED ORDER — TECHNETIUM TC 99M TILMANOCEPT KIT
1.0000 | PACK | Freq: Once | INTRAVENOUS | Status: AC | PRN
  Administered 2021-01-28: 816.7 via INTRADERMAL

## 2021-01-28 MED ORDER — PROPOFOL 500 MG/50ML IV EMUL
INTRAVENOUS | Status: AC
  Filled 2021-01-28: qty 50

## 2021-01-28 MED ORDER — FENTANYL CITRATE (PF) 100 MCG/2ML IJ SOLN
INTRAMUSCULAR | Status: DC | PRN
  Administered 2021-01-28: 75 ug via INTRAVENOUS
  Administered 2021-01-28: 25 ug via INTRAVENOUS

## 2021-01-28 MED ORDER — MIDAZOLAM HCL 2 MG/2ML IJ SOLN
INTRAMUSCULAR | Status: AC
  Filled 2021-01-28: qty 2

## 2021-01-28 MED ORDER — ONDANSETRON HCL 4 MG/2ML IJ SOLN
INTRAMUSCULAR | Status: AC
  Filled 2021-01-28: qty 2

## 2021-01-28 MED ORDER — PROPOFOL 10 MG/ML IV BOLUS
INTRAVENOUS | Status: DC | PRN
  Administered 2021-01-28: 50 mg via INTRAVENOUS
  Administered 2021-01-28: 200 mg via INTRAVENOUS

## 2021-01-28 MED ORDER — FENTANYL CITRATE (PF) 100 MCG/2ML IJ SOLN
25.0000 ug | INTRAMUSCULAR | Status: DC | PRN
  Administered 2021-01-28: 25 ug via INTRAVENOUS

## 2021-01-28 SURGICAL SUPPLY — 51 items
ADH SKN CLS APL DERMABOND .7 (GAUZE/BANDAGES/DRESSINGS) ×1
APL PRP STRL LF DISP 70% ISPRP (MISCELLANEOUS) ×1
BINDER BREAST LRG (GAUZE/BANDAGES/DRESSINGS) IMPLANT
BINDER BREAST MEDIUM (GAUZE/BANDAGES/DRESSINGS) IMPLANT
BINDER BREAST XLRG (GAUZE/BANDAGES/DRESSINGS) IMPLANT
BINDER BREAST XXLRG (GAUZE/BANDAGES/DRESSINGS) IMPLANT
BLADE SURG 15 STRL LF DISP TIS (BLADE) ×2 IMPLANT
BLADE SURG 15 STRL SS (BLADE) ×4
CANISTER SUCT 1200ML W/VALVE (MISCELLANEOUS) IMPLANT
CHLORAPREP W/TINT 26 (MISCELLANEOUS) ×2 IMPLANT
CNTNR SPEC 2.5X3XGRAD LEK (MISCELLANEOUS)
CONT SPEC 4OZ STER OR WHT (MISCELLANEOUS)
CONT SPEC 4OZ STRL OR WHT (MISCELLANEOUS)
CONTAINER SPEC 2.5X3XGRAD LEK (MISCELLANEOUS) IMPLANT
COVER WAND RF STERILE (DRAPES) ×2 IMPLANT
DERMABOND ADVANCED (GAUZE/BANDAGES/DRESSINGS) ×1
DERMABOND ADVANCED .7 DNX12 (GAUZE/BANDAGES/DRESSINGS) ×1 IMPLANT
DEVICE DUBIN SPECIMEN MAMMOGRA (MISCELLANEOUS) ×2 IMPLANT
DRAPE LAPAROTOMY TRNSV 106X77 (MISCELLANEOUS) ×2 IMPLANT
DRSG GAUZE FLUFF 36X18 (GAUZE/BANDAGES/DRESSINGS) IMPLANT
ELECT CAUTERY BLADE 6.4 (BLADE) ×2 IMPLANT
ELECT REM PT RETURN 9FT ADLT (ELECTROSURGICAL) ×2
ELECTRODE REM PT RTRN 9FT ADLT (ELECTROSURGICAL) ×1 IMPLANT
GLOVE SURG ENC MOIS LTX SZ6.5 (GLOVE) ×4 IMPLANT
GLOVE SURG UNDER POLY LF SZ6.5 (GLOVE) ×4 IMPLANT
GOWN STRL REUS W/ TWL LRG LVL3 (GOWN DISPOSABLE) ×2 IMPLANT
GOWN STRL REUS W/TWL LRG LVL3 (GOWN DISPOSABLE) ×4
KIT MARKER MARGIN INK (KITS) IMPLANT
KIT TURNOVER KIT A (KITS) ×2 IMPLANT
LABEL OR SOLS (LABEL) ×2 IMPLANT
MANIFOLD NEPTUNE II (INSTRUMENTS) ×2 IMPLANT
MARGIN MAP 10MM (MISCELLANEOUS) IMPLANT
MARKER MARGIN CORRECT CLIP (MARKER) IMPLANT
NEEDLE HYPO 22GX1.5 SAFETY (NEEDLE) ×2 IMPLANT
NEEDLE HYPO 25X1 1.5 SAFETY (NEEDLE) ×2 IMPLANT
PACK BASIN MINOR ARMC (MISCELLANEOUS) ×2 IMPLANT
RETRACTOR RING XSMALL (MISCELLANEOUS) IMPLANT
RTRCTR WOUND ALEXIS 13CM XS SH (MISCELLANEOUS)
SET LOCALIZER 20 PROBE US (MISCELLANEOUS) ×2 IMPLANT
SLEVE PROBE SENORX GAMMA FIND (MISCELLANEOUS) ×2 IMPLANT
SUT ETHILON 3-0 FS-10 30 BLK (SUTURE)
SUT MNCRL 4-0 (SUTURE) ×4
SUT MNCRL 4-0 27XMFL (SUTURE) ×2
SUT SILK 2 0 SH (SUTURE) IMPLANT
SUT VIC AB 3-0 SH 27 (SUTURE) ×4
SUT VIC AB 3-0 SH 27X BRD (SUTURE) ×2 IMPLANT
SUTURE EHLN 3-0 FS-10 30 BLK (SUTURE) IMPLANT
SUTURE MNCRL 4-0 27XMF (SUTURE) ×2 IMPLANT
SYR 10ML LL (SYRINGE) ×4 IMPLANT
SYR BULB IRRIG 60ML STRL (SYRINGE) ×2 IMPLANT
WATER STERILE IRR 1000ML POUR (IV SOLUTION) ×2 IMPLANT

## 2021-01-28 NOTE — Discharge Instructions (Signed)
AMBULATORY SURGERY  DISCHARGE INSTRUCTIONS   1) The drugs that you were given will stay in your system until tomorrow so for the next 24 hours you should not:  A) Drive an automobile B) Make any legal decisions C) Drink any alcoholic beverage   2) You may resume regular meals tomorrow.  Today it is better to start with liquids and gradually work up to solid foods.  You may eat anything you prefer, but it is better to start with liquids, then soup and crackers, and gradually work up to solid foods.   3) Please notify your doctor immediately if you have any unusual bleeding, trouble breathing, redness and pain at the surgery site, drainage, fever, or pain not relieved by medication. 4)   5) Your post-operative visit with Dr.                                     is: Date:                        Time:    Please call to schedule your post-operative visit.  6) Additional Instructions:     Diet: Resume home heart healthy regular diet.   Activity: No heavy lifting >20 pounds (children, pets, laundry, garbage) or strenuous activity until follow-up, but light activity and walking are encouraged. Do not drive or drink alcohol if taking narcotic pain medications.  Wound care: May shower with soapy water and pat dry (do not rub incisions), but no baths or submerging incision underwater until follow-up. (no swimming)   Medications: Resume all home medications. For mild to moderate pain: acetaminophen (Tylenol) or ibuprofen (if no kidney disease). Combining Tylenol with alcohol can substantially increase your risk of causing liver disease. Narcotic pain medications, if prescribed, can be used for severe pain, though may cause nausea, constipation, and drowsiness. Do not combine Tylenol and Norco within a 6 hour period as Norco contains Tylenol. If you do not need the narcotic pain medication, you do not need to fill the prescription.  Call office [phone]) at any time if any questions,  worsening pain, fevers/chills, bleeding, drainage from incision site, or other concerns.

## 2021-01-28 NOTE — Anesthesia Preprocedure Evaluation (Signed)
Anesthesia Evaluation  Patient identified by MRN, date of birth, ID band Patient awake    Reviewed: Allergy & Precautions, H&P , NPO status , Patient's Chart, lab work & pertinent test results, reviewed documented beta blocker date and time   Airway Mallampati: II  TM Distance: >3 FB Neck ROM: full    Dental  (+) Teeth Intact   Pulmonary COPD,  COPD inhaler, Current Smoker and Patient abstained from smoking.,    Pulmonary exam normal        Cardiovascular Exercise Tolerance: Good negative cardio ROS Normal cardiovascular exam Rate:Normal     Neuro/Psych  Headaches, PSYCHIATRIC DISORDERS Depression    GI/Hepatic Neg liver ROS, GERD  Medicated,  Endo/Other  negative endocrine ROS  Renal/GU negative Renal ROS  negative genitourinary   Musculoskeletal   Abdominal   Peds  Hematology negative hematology ROS (+)   Anesthesia Other Findings   Reproductive/Obstetrics negative OB ROS                             Anesthesia Physical Anesthesia Plan  ASA: III  Anesthesia Plan: General LMA   Post-op Pain Management:    Induction:   PONV Risk Score and Plan: 3  Airway Management Planned:   Additional Equipment:   Intra-op Plan:   Post-operative Plan:   Informed Consent: I have reviewed the patients History and Physical, chart, labs and discussed the procedure including the risks, benefits and alternatives for the proposed anesthesia with the patient or authorized representative who has indicated his/her understanding and acceptance.       Plan Discussed with: CRNA  Anesthesia Plan Comments:         Anesthesia Quick Evaluation

## 2021-01-28 NOTE — Op Note (Signed)
Preoperative diagnosis: Left breast carcinoma.  Postoperative diagnosis: Left breast carcinoma..   Procedure: Left radiofrequency tag-localized partial mastectomy.                       Left Axillary Sentinel Lymph node biopsy  Anesthesia: GETA  Surgeon: Dr. Windell Moment  Wound Classification: Clean  Indications: Patient is a 75 y.o. female with a nonpalpable left breast mass noted on mammography with core biopsy demonstrating invasive mammary carcinoma requires radiofrequency tag-localized partial mastectomy for treatment with sentinel lymph node biopsy.   Findings: 1. Specimen mammography shows marker and tag on specimen 2. Pathology call refers gross examination of margins was negative 3. No other palpable mass or lymph node identified.   Description of procedure: Preoperative radiofrequency tag localization was performed by radiology. In the nuclear medicine suite, the subareolar region was injected with Tc-99 sulfur colloid. Localization studies were reviewed. The patient was taken to the operating room and placed supine on the operating table, and after general anesthesia the left chest and axilla were prepped and draped in the usual sterile fashion. A time-out was completed verifying correct patient, procedure, site, positioning, and implant(s) and/or special equipment prior to beginning this procedure.  By comparing the localization studies and interrogation with Localizer device, the probable trajectory and location of the mass was visualized. A circumareolar skin incision was planned in such a way as to minimize the amount of dissection to reach the mass.  The skin incision was made. Flaps were raised and the location of the tag was confirmed with Localizer device confirmed. A 2-0 silk figure-of-eight stay suture was placed and used for retraction. Dissection was then taken down circumferentially, taking care to include the entire localizing tag and a wide margin of grossly normal  tissue. The specimen and entire localizing tag were removed. The specimen was oriented and sent to radiology with the localization studies. Confirmation was received that the entire target lesion had been resected.   A hand-held gamma probe was used to identify the location of the hottest spot in the axilla. Dissection was carried down until subdermal facias was advanced. The probe was placed and again, the point of maximal count was found. Dissection continue until nodule was identified. The probe was placed in contact with the node. The node was excised in its entirety.  Two additional hot spot was detected and the node was excised in similar fashion. No clinically abnormal nodes were palpated. The procedure was terminated. Hemostasis was achieved and the wound closed in layers with deep interrupted 3-0 Vicryl and skin was closed with subcuticular suture of Monocryl 4-0.  The patient tolerated the procedure well and was taken to the postanesthesia care unit in stable condition.   Sentinel Node Biopsy Synoptic Operative Report  Operation performed with curative intent:No  Tracer(s) used to identify sentinel nodes in the upfront surgery (non-neoadjuvant) setting (select all that apply):Radioactive Tracer  Tracer(s) used to identify sentinel nodes in the neoadjuvant setting (select all that apply):N/A  All nodes (colored or non-colored) present at the end of a dye-filled lymphatic channel were removed:N/A  All significantly radioactive nodes were removed:Yes  All palpable suspicious nodes were removed:N/A  Biopsy-proven positive nodes marked with clips prior to chemotherapy were identified and removed:N/A   Specimen: Left Breast mass                     Sentinel Lymph nodes #1, #2, #3  Complications: None  Estimated Blood Loss:  5 mL

## 2021-01-28 NOTE — Interval H&P Note (Signed)
History and Physical Interval Note:  01/28/2021 10:37 AM  [name]  has presented today for surgery, with the diagnosis of C50.412 malignant neoplasm upper-outer quadrant left breast.  The various methods of treatment have been discussed with the patient and family. After consideration of risks, benefits and other options for treatment, the patient has consented to  Procedure(s): [place] (Left) as a surgical intervention.  The patient's history has been reviewed, patient examined, no change in status, stable for surgery.  I have reviewed the patient's chart and labs.  Questions were answered to the patient's satisfaction.     Herbert Pun

## 2021-01-28 NOTE — Anesthesia Procedure Notes (Signed)
Procedure Name: LMA Insertion Date/Time: 01/28/2021 11:11 AM Performed by: Justus Memory, CRNA Pre-anesthesia Checklist: Patient identified, Patient being monitored, Timeout performed, Emergency Drugs available and Suction available Patient Re-evaluated:Patient Re-evaluated prior to induction Oxygen Delivery Method: Circle system utilized Preoxygenation: Pre-oxygenation with 100% oxygen Induction Type: IV induction Ventilation: Mask ventilation without difficulty LMA: LMA inserted LMA Size: 3.5 Tube type: Oral Number of attempts: 1 Placement Confirmation: positive ETCO2 and breath sounds checked- equal and bilateral Tube secured with: Tape Dental Injury: Teeth and Oropharynx as per pre-operative assessment

## 2021-01-28 NOTE — Transfer of Care (Signed)
Immediate Anesthesia Transfer of Care Note  Patient: Morgan Valdez  Procedure(s) Performed: BREAST LUMPECTOMY,RADIO FREQ LOCALIZER,AXILLARY SENTINEL LYMPH NODE BIOPSY (Left )  Patient Location: PACU  Anesthesia Type:general  Level of Consciousness: sedated  Airway & Oxygen Therapy: Patient Spontanous Breathing and Patient connected to face mask oxygen  Post-op Assessment: Report given to RN and Post -op Vital signs reviewed and stable  Post vital signs: Reviewed and stable  Last Vitals:  Vitals Value Taken Time  BP 140/88 01/28/21 1240  Temp    Pulse 79 01/28/21 1244  Resp 11 01/28/21 1244  SpO2 97 % 01/28/21 1244  Vitals shown include unvalidated device data.  Last Pain:  Vitals:   01/28/21 1027  TempSrc: Temporal  PainSc: 0-No pain         Complications: No complications documented.

## 2021-01-28 NOTE — Anesthesia Postprocedure Evaluation (Signed)
Anesthesia Post Note  Patient: Morgan Valdez  Procedure(s) Performed: BREAST LUMPECTOMY,RADIO FREQ LOCALIZER,AXILLARY SENTINEL LYMPH NODE BIOPSY (Left )  Patient location during evaluation: PACU Anesthesia Type: General Level of consciousness: awake and alert Pain management: pain level controlled Vital Signs Assessment: post-procedure vital signs reviewed and stable Respiratory status: spontaneous breathing, nonlabored ventilation, respiratory function stable and patient connected to nasal cannula oxygen Cardiovascular status: blood pressure returned to baseline and stable Postop Assessment: no apparent nausea or vomiting Anesthetic complications: no   No complications documented.   Last Vitals:  Vitals:   01/28/21 1245 01/28/21 1300  BP: (!) 155/84 (!) 153/88  Pulse: 79 81  Resp: 11 (!) 25  Temp:    SpO2: 97% 98%    Last Pain:  Vitals:   01/28/21 1300  TempSrc:   PainSc: 0-No pain                 Molli Barrows

## 2021-01-29 ENCOUNTER — Encounter: Payer: Self-pay | Admitting: General Surgery

## 2021-02-02 ENCOUNTER — Other Ambulatory Visit: Payer: Self-pay | Admitting: Anatomic Pathology & Clinical Pathology

## 2021-02-02 LAB — SURGICAL PATHOLOGY

## 2021-02-04 ENCOUNTER — Inpatient Hospital Stay: Payer: Medicare HMO | Attending: Internal Medicine | Admitting: Hospice and Palliative Medicine

## 2021-02-04 ENCOUNTER — Other Ambulatory Visit: Payer: Self-pay

## 2021-02-04 DIAGNOSIS — Z170 Estrogen receptor positive status [ER+]: Secondary | ICD-10-CM | POA: Insufficient documentation

## 2021-02-04 DIAGNOSIS — C50812 Malignant neoplasm of overlapping sites of left female breast: Secondary | ICD-10-CM | POA: Insufficient documentation

## 2021-02-04 DIAGNOSIS — Z79899 Other long term (current) drug therapy: Secondary | ICD-10-CM | POA: Insufficient documentation

## 2021-02-04 NOTE — Progress Notes (Signed)
Multidisciplinary Oncology Council Documentation  Morgan Valdez was presented by our Morgan Valdez on 02/04/2021, which included representatives from:  . Palliative Care . Dietitian  . Physical/Occupational Therapist . Nurse Navigator . Genetics . Speech Therapist . Social work . Survivorship RN . Hotel manager . Research RN . Morgan Valdez currently presents with history of stage I breast cancer.   We reviewed previous medical and familial history, history of present illness, and recent lab results along with all available histopathologic and imaging studies. The Morgan Valdez considered available treatment options and made the following recommendations/referrals:  Genetics.   The MOC is a meeting of clinicians from various specialty areas who evaluate and discuss patients for whom a multidisciplinary approach is being considered. Final determinations in the plan of care are those of the provider(s).   Today's extended care, comprehensive team conference, Morgan Valdez was not present for the discussion and was not examined.

## 2021-02-09 DIAGNOSIS — J3089 Other allergic rhinitis: Secondary | ICD-10-CM | POA: Diagnosis not present

## 2021-02-09 DIAGNOSIS — M48062 Spinal stenosis, lumbar region with neurogenic claudication: Secondary | ICD-10-CM | POA: Diagnosis not present

## 2021-02-09 DIAGNOSIS — R195 Other fecal abnormalities: Secondary | ICD-10-CM | POA: Diagnosis not present

## 2021-02-09 DIAGNOSIS — Z0000 Encounter for general adult medical examination without abnormal findings: Secondary | ICD-10-CM | POA: Diagnosis not present

## 2021-02-09 DIAGNOSIS — F3341 Major depressive disorder, recurrent, in partial remission: Secondary | ICD-10-CM | POA: Diagnosis not present

## 2021-02-09 DIAGNOSIS — J432 Centrilobular emphysema: Secondary | ICD-10-CM | POA: Diagnosis not present

## 2021-02-09 DIAGNOSIS — I700 Atherosclerosis of aorta: Secondary | ICD-10-CM | POA: Diagnosis not present

## 2021-02-09 DIAGNOSIS — R7309 Other abnormal glucose: Secondary | ICD-10-CM | POA: Diagnosis not present

## 2021-02-09 DIAGNOSIS — Z720 Tobacco use: Secondary | ICD-10-CM | POA: Diagnosis not present

## 2021-02-10 ENCOUNTER — Encounter: Payer: Self-pay | Admitting: Internal Medicine

## 2021-02-10 ENCOUNTER — Inpatient Hospital Stay: Payer: Medicare HMO | Admitting: Internal Medicine

## 2021-02-10 DIAGNOSIS — Z170 Estrogen receptor positive status [ER+]: Secondary | ICD-10-CM

## 2021-02-10 DIAGNOSIS — C50812 Malignant neoplasm of overlapping sites of left female breast: Secondary | ICD-10-CM | POA: Diagnosis not present

## 2021-02-10 DIAGNOSIS — Z79899 Other long term (current) drug therapy: Secondary | ICD-10-CM | POA: Diagnosis not present

## 2021-02-10 NOTE — Progress Notes (Signed)
Patient here for oncology follow-up appointment, expresses concerns of breast pain

## 2021-02-10 NOTE — Assessment & Plan Note (Addendum)
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  I reviewed the pathology and stage with the patient in detail.    # Given grade 1/5 mm tumor-would not recommend Oncotype testing; recommend adjuvant radiation.  Would not recommend chemotherapy given the low risk of recurrence.  However discussed regarding aromatase inhibitor post radiation therapy.  # Genetics: Given multiple malignancies recommended evaluation; awaiting in May 2022.   # Smoking: Active smoker; fdiscussed re: smoking cessation.  Discuss lung cancer screening program/next visit   # DISPOSITION:  # referral to Dr.Crystal re: breast cancer # follow up in 2 months-No labs- Dr. Jacinto Reap

## 2021-02-10 NOTE — Progress Notes (Signed)
[facility] [place] NOTE  Patient Care Team: Tracie Harrier, MD as PCP - General (Internal Medicine) Theodore Demark, RN as Oncology Nurse Navigator  CHIEF COMPLAINTS/PURPOSE OF CONSULTATION: Breast cancer  #  Oncology History Overview Note  #  BREAST, LEFT 2:00; ULTRASOUND-GUIDED BIOPSY:  - INVASIVE MAMMARY CARCINOMA WITH FEATURES SUGGESTIVE OF SOLID  PSEUDOPAPILLARY CARCINOMA; ER >90%; PR > 90%; he 2 Neu-NEG [Dr.Cinton]  PATHOLOGIC STAGE CLASSIFICATION (pTNM, AJCC 8th Edition):  TNM Descriptors: Not applicable  [id number]  Regional Lymph Nodes Modifier: sn  pN0  pM - Not applicable   SPECIAL STUDIES  Breast Biomarker Testing Performed on Previous Biopsy: [id number]  Estrogen Receptor (ER) Status: POSITIVE          Percentage of cells with nuclear positivity: Greater than 90%          Average intensity of staining: Strong   Progesterone Receptor (PgR) Status: POSITIVE          Percentage of cells with nuclear positivity: Greater than 90%          Average intensity of staining: Moderate   HER2 (by immunohistochemistry): NEGATIVE (Score 1+)  #Cancer screening : colonoscopy [since 56 years; again aug 2022];smoker;        Carcinoma of overlapping sites of left breast in female, estrogen receptor positive ([place])  01/19/2021 Initial Diagnosis   Carcinoma of overlapping sites of left breast in female, estrogen receptor positive ([place])   01/19/2021 Cancer Staging   Staging form: Breast, AJCC 8th Edition - Clinical: Stage IA (cT1a, cN0, cM0, G2, ER+, PR+, HER2-) - Signed by Cammie Sickle, MD on 01/19/2021 Histologic grading system: 3 grade system      HISTORY OF PRESENTING ILLNESS:  Morgan Valdez 75 y.o.  female with recently diagnosed breast cancer early stage is here to review the results of the pathology/post lumpectomy.  Patient denies any postoperative complications.  Complains of mild numbness at the site of surgery/mild pain.  Otherwise no fevers  or chills.    Review of Systems  Constitutional: Negative for chills, diaphoresis, fever, malaise/fatigue and weight loss.  HENT: Negative for nosebleeds and sore throat.   Eyes: Negative for double vision.  Respiratory: Negative for cough, hemoptysis, sputum production, shortness of breath and wheezing.   Cardiovascular: Negative for chest pain, palpitations, orthopnea and leg swelling.  Gastrointestinal: Negative for abdominal pain, blood in stool, constipation, diarrhea, heartburn, melena, nausea and vomiting.  Genitourinary: Negative for dysuria, frequency and urgency.  Musculoskeletal: Positive for back pain. Negative for joint pain.  Skin: Negative.  Negative for itching and rash.  Neurological: Negative for dizziness, tingling, focal weakness, weakness and headaches.  Endo/Heme/Allergies: Does not bruise/bleed easily.  Psychiatric/Behavioral: Negative for depression. The patient is not nervous/anxious and does not have insomnia.      MEDICAL HISTORY:  Past Medical History:  Diagnosis Date  . Allergic state   . Arthritis   . Barrett's esophagus   . Chronic cough   . COPD (chronic obstructive pulmonary disease) ([place])   . Depression   . Duodenitis   . Esophageal motility disorder   . Gastritis   . GERD (gastroesophageal reflux disease)   . Headache    migraines - 3-4x/yr  . Pre-diabetes   . Tobacco abuse disorder   . Varicose veins of legs     SURGICAL HISTORY: Past Surgical History:  Procedure Laterality Date  . APPENDECTOMY    . BREAST BIOPSY Left 01/02/2021   [facility] WITH FEATURES SUGGESTIVE OF SOLID  PSEUDOPAPILLARY CARCINOMA  . BREAST LUMPECTOMY,RADIO FREQ LOCALIZER,AXILLARY SENTINEL LYMPH NODE BIOPSY Left 01/28/2021   Procedure: BREAST LUMPECTOMY,RADIO FREQ LOCALIZER,AXILLARY SENTINEL LYMPH NODE BIOPSY;  Surgeon: Herbert Pun, MD;  Location: [facility];  Service: General;  Laterality: Left;  . CATARACT EXTRACTION W/PHACO Right 01/03/2017   Procedure: CATARACT  EXTRACTION PHACO AND INTRAOCULAR LENS PLACEMENT ([place])  Right;  Surgeon: Ronnell Freshwater, MD;  Location: [facility];  Service: Ophthalmology;  Laterality: Right;  . CATARACT EXTRACTION W/PHACO Left 01/17/2017   Procedure: CATARACT EXTRACTION PHACO AND INTRAOCULAR LENS PLACEMENT ([facility]);  Surgeon: Ronnell Freshwater, MD;  Location: [facility];  Service: Ophthalmology;  Laterality: Left;  left  . CHOLECYSTECTOMY    . COLONOSCOPY WITH PROPOFOL N/A 04/06/2018   Procedure: COLONOSCOPY WITH PROPOFOL;  Surgeon: Lollie Sails, MD;  Location: [facility] ENDOSCOPY;  Service: Endoscopy;  Laterality: N/A;  . ESOPHAGOGASTRODUODENOSCOPY N/A 09/26/2018   Procedure: ESOPHAGOGASTRODUODENOSCOPY (EGD);  Surgeon: Lollie Sails, MD;  Location: [facility] ENDOSCOPY;  Service: Endoscopy;  Laterality: N/A;  . ESOPHAGOGASTRODUODENOSCOPY (EGD) WITH PROPOFOL N/A 10/14/2015   Procedure: ESOPHAGOGASTRODUODENOSCOPY (EGD) WITH PROPOFOL;  Surgeon: Lollie Sails, MD;  Location[facility] END[facility]COPY;  Service: Endoscopy;  Laterality: N/A;  . EYE SURGERY Bilateral 2019   cataracts   . HERNIA REPAIR      SOCIAL HISTORY: Social History   Socioeconomic History  . Marital status: Widowed    Spouse name: Not on file  . Number of children: Not on file  . Years of education: Not on file  . Highest education level: Not on file  Occupational History  . Not on file  Tobacco Use  . Smoking status: Current Every Day Smoker    Packs/day: 0.50    Years: 50.00    Pack years: 25.00  . Smokeless tobacco: Never Used  Vaping Use  . Vaping Use: Never used  Substance and Sexual Activity  . Alcohol use: Yes    Alcohol/week: 1.0 standard drink    Types: 1 Glasses of wine per week    Comment: rare - Holidays  . Drug use: No  . Sexual activity: Not on file  Other Topics Concern  . 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 Cancer Mother        esophagus, spine. adrenal gland; lung.   . Dementia Father   . Cancer Sister        colon cancer- 3s to liver.   . Cancer Maternal Grandmother        brain cancer  . Diabetes Maternal Grandfather   . Stroke Maternal Grandfather   . Rectal cancer Daughter        at 88 years; survived.   . Breast cancer Neg Hx     ALLERGIES:  is allergic to tape, bacitracin, and other.  MEDICATIONS:  Current Outpatient Medications  Medication Sig Dispense Refill  . ALPRAZolam (XANAX) 0.25 MG tablet Take 0.25 mg by mouth 2 (two) times daily as needed for anxiety.    . cetirizine (ZYRTEC) 10 MG  tablet Take 10 mg by mouth daily as needed for allergies.    . clotrimazole-betamethasone (LOTRISONE) cream Apply 1 application topically 2 (two) times daily.    . fluticasone (FLONASE) 50 MCG/ACT nasal spray Place 2 sprays into both nostrils daily as needed for allergies.    Marland Kitchen gabapentin (NEURONTIN) 100 MG capsule Take 100 mg by mouth 3 (three) times daily. Two capsules 3 times a day as needed    . lidocaine-prilocaine (EMLA) cream Apply 1 application topically as needed.    Marland Kitchen omeprazole (PRILOSEC) 40 MG capsule Take 40 mg by mouth in the morning and at bedtime.    . ondansetron (ZOFRAN) 4 MG tablet Take 4 mg by mouth 2 (two) times daily as needed for nausea/vomiting.  0  . Polyethyl Glycol-Propyl Glycol (SYSTANE) 0.4-0.3 % SOLN Place 1-2 drops into both eyes 3 (three) times daily as needed (dry/irritated eyes).    . sertraline (ZOLOFT) 50 MG tablet Take 50 mg by mouth daily.    Marland Kitchen SPIRIVA HANDIHALER 18 MCG inhalation capsule Place 1 capsule into  inhaler and inhale in the morning.    Marland Kitchen buPROPion (WELLBUTRIN SR) 150 MG 12 hr tablet Take 150 mg by mouth daily. (Patient not taking: No sig reported)    . furosemide (LASIX) 20 MG tablet Take 20 mg by mouth as needed for fluid.  (Patient not taking: Reported on 02/10/2021)    . peppermint oil liquid Take 1 capsule by mouth in the morning, at noon, and at bedtime. PEPPERMINT OIL WITH GINGER & FENNEL (Patient not taking: Reported on 02/10/2021)    . senna-docusate (SENOKOT-S) 8.6-50 MG tablet Take 1 tablet by mouth daily. (Patient not taking: Reported on 02/10/2021) 30 tablet 0   No current facility-administered medications for this visit.      Marland Kitchen  PHYSICAL EXAMINATION: ECOG PERFORMANCE STATUS: 0 - Asymptomatic  Vitals:   02/10/21 0952  BP: 118/72  Pulse: 83  Resp: 18  Temp: (!) 97.4 F (36.3 C)  SpO2: 100%   Filed Weights   02/10/21 0952  Weight: 150 lb 9.6 oz (68.3 kg)    Physical Exam Constitutional:      Comments: Ambulating independently.  Accompanied by family.  HENT:     Head: Normocephalic and atraumatic.     Mouth/Throat:     Pharynx: No oropharyngeal exudate.  Eyes:     Pupils: Pupils are equal, round, and reactive to light.  Cardiovascular:     Rate and Rhythm: Normal rate and regular rhythm.  Pulmonary:     Effort: Pulmonary effort is normal. No respiratory distress.     Breath sounds: Normal breath sounds. No wheezing.  Abdominal:     General: Bowel sounds are normal. There is no distension.     Palpations: Abdomen is soft. There is no mass.     Tenderness: There is no abdominal tenderness. There is no guarding or rebound.  Musculoskeletal:        General: No tenderness. Normal range of motion.     Cervical back: Normal range of motion and neck supple.  Skin:    General: Skin is warm.  Neurological:     Mental Status: She is alert and oriented to person, place, and time.  Psychiatric:        Mood and Affect: Affect normal.      LABORATORY DATA:  I  have reviewed the data as listed Lab Results  Component Value Date   WBC 9.3 01/19/2021   HGB 16.3 (H) 01/19/2021  HCT 48.8 (H) 01/19/2021   MCV 93.0 01/19/2021   PLT 255 01/19/2021   Recent Labs    03/02/20 1050 01/19/21 1153  NA 138 137  K 4.5 4.4  CL 104 99  CO2 24 27  GLUCOSE 105* 97  BUN 11 17  CREATININE 0.93 0.97  CALCIUM 8.8* 9.1  GFRNONAA >60 >60  GFRAA >60  --   PROT 6.8 7.2  ALBUMIN 3.9 4.1  AST 13* 13*  ALT 12 11  ALKPHOS 61 59  BILITOT 0.7 0.6    RADIOGRAPHIC STUDIES: I have personally reviewed the radiological images as listed and agreed with the findings in the report. NM Sentinel Node Inj-No Rpt (Breast)  Result Date: 01/28/2021 Sulfur colloid was injected by the nuclear medicine technologist for melanoma sentinel node.   MM Breast Surgical Specimen  Result Date: 01/28/2021 CLINICAL DATA:  Status post breast conserving surgery for invasive mammary carcinoma in the left breast. EXAM: SPECIMEN RADIOGRAPH OF THE LEFT BREAST COMPARISON:  Previous exam(s). FINDINGS: Status post excision of the left breast. The radiofrequency tag and Q shaped biopsy marker clip are present and completely intact. IMPRESSION: Specimen radiograph of the left breast. Electronically Signed   By: Zerita Boers M.D.   On: 01/28/2021 14:07   MM LT RADIO FREQUENCY TAG LOC MAMMO GUIDE  Result Date: 01/22/2021 CLINICAL DATA:  75 year old female for localization of LEFT breast cancer prior to lumpectomy. EXAM: MAMMOGRAPHIC GUIDED RADIOFREQUENCY DEVICE LOCALIZATION OF THE LEFT BREAST COMPARISON:  Previous exam(s) FINDINGS: Patient presents for radiofrequency device localization prior to . I met with the patient and we discussed the procedure of radiofrequency device localization including benefits and alternatives. We discussed the high likelihood of a successful procedure. We discussed the risks of the procedure including infection, bleeding, tissue injury and further surgery. Informed,  written consent was given. The usual time-out protocol was performed immediately prior to the procedure. Using mammographic guidance, sterile technique, 1% lidocaine as local anesthesia, a radiofrequency tag was used to localize the Q biopsy clip using a LATERAL approach. The follow-up mammogram images confirm that the RF device is in the expected location and are marked for Dr. Windell Moment. Follow-up survey of the patient confirms the presence of the RF device. The patient tolerated the procedure well and was released from the [facility]. IMPRESSION: Radiofrequency device localization of the LEFT breast. No apparent complications. Electronically Signed   By: Margarette Canada M.D.   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  I reviewed the pathology and stage with the patient in detail.    # Given grade 1/5 mm tumor-would not recommend Oncotype testing; recommend adjuvant radiation.  Would not recommend chemotherapy given the low risk of recurrence.  However discussed regarding aromatase inhibitor post radiation therapy.  # Genetics: Given multiple malignancies recommended evaluation; awaiting in May 2022.   # Smoking: Active smoker; fdiscussed re: smoking cessation.  Discuss lung cancer screening program/next visit   # DISPOSITION:  # referral to Dr.Crystal re: breast cancer # follow up in 2 months-No labs- Dr. B  All questions were answered. The patient/family knows to call the clinic with any problems, questions or concerns.    Cammie Sickle, MD 02/10/2021 1:20 PM

## 2021-02-11 ENCOUNTER — Other Ambulatory Visit: Payer: Medicare HMO

## 2021-02-11 ENCOUNTER — Encounter: Payer: Medicare HMO | Admitting: Licensed Clinical Social Worker

## 2021-02-16 DIAGNOSIS — G2581 Restless legs syndrome: Secondary | ICD-10-CM | POA: Diagnosis not present

## 2021-02-16 DIAGNOSIS — Z0000 Encounter for general adult medical examination without abnormal findings: Secondary | ICD-10-CM | POA: Diagnosis not present

## 2021-02-16 DIAGNOSIS — F17210 Nicotine dependence, cigarettes, uncomplicated: Secondary | ICD-10-CM | POA: Diagnosis not present

## 2021-02-16 DIAGNOSIS — Z170 Estrogen receptor positive status [ER+]: Secondary | ICD-10-CM | POA: Diagnosis not present

## 2021-02-16 DIAGNOSIS — R7303 Prediabetes: Secondary | ICD-10-CM | POA: Diagnosis not present

## 2021-02-16 DIAGNOSIS — I700 Atherosclerosis of aorta: Secondary | ICD-10-CM | POA: Diagnosis not present

## 2021-02-16 DIAGNOSIS — F329 Major depressive disorder, single episode, unspecified: Secondary | ICD-10-CM | POA: Diagnosis not present

## 2021-02-16 DIAGNOSIS — C50912 Malignant neoplasm of unspecified site of left female breast: Secondary | ICD-10-CM | POA: Diagnosis not present

## 2021-02-16 DIAGNOSIS — J439 Emphysema, unspecified: Secondary | ICD-10-CM | POA: Diagnosis not present

## 2021-02-19 ENCOUNTER — Encounter: Payer: Self-pay | Admitting: Radiation Oncology

## 2021-02-19 ENCOUNTER — Ambulatory Visit
Admission: RE | Admit: 2021-02-19 | Discharge: 2021-02-19 | Disposition: A | Discharge status: Home / Self Care | Payer: Medicare HMO | Source: Ambulatory Visit | Attending: Radiation Oncology | Admitting: Radiation Oncology

## 2021-02-19 VITALS — BP 146/82 | HR 89 | Temp 96.1°F | Wt 149.7 lb

## 2021-02-19 DIAGNOSIS — J449 Chronic obstructive pulmonary disease, unspecified: Secondary | ICD-10-CM | POA: Diagnosis not present

## 2021-02-19 DIAGNOSIS — K219 Gastro-esophageal reflux disease without esophagitis: Secondary | ICD-10-CM | POA: Insufficient documentation

## 2021-02-19 DIAGNOSIS — Z79899 Other long term (current) drug therapy: Secondary | ICD-10-CM | POA: Diagnosis not present

## 2021-02-19 DIAGNOSIS — M129 Arthropathy, unspecified: Secondary | ICD-10-CM | POA: Insufficient documentation

## 2021-02-19 DIAGNOSIS — Z809 Family history of malignant neoplasm, unspecified: Secondary | ICD-10-CM | POA: Insufficient documentation

## 2021-02-19 DIAGNOSIS — Z170 Estrogen receptor positive status [ER+]: Secondary | ICD-10-CM | POA: Diagnosis not present

## 2021-02-19 DIAGNOSIS — C50812 Malignant neoplasm of overlapping sites of left female breast: Secondary | ICD-10-CM

## 2021-02-19 DIAGNOSIS — K2270 Barrett's esophagus without dysplasia: Secondary | ICD-10-CM | POA: Insufficient documentation

## 2021-02-19 DIAGNOSIS — F17210 Nicotine dependence, cigarettes, uncomplicated: Secondary | ICD-10-CM | POA: Diagnosis not present

## 2021-02-19 NOTE — Consult Note (Signed)
NEW PATIENT EVALUATION  Name: Morgan Valdez  MRN: [id number]  Date:   02/19/2021     DOB: 1946-08-12   This 75 y.o. 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  REFERRING PHYSICIAN: Tracie Harrier, MD  CHIEF COMPLAINT:  Chief Complaint  Patient presents with  . Breast Cancer    Initial consultation    DIAGNOSIS: The encounter diagnosis was Carcinoma of overlapping sites of left breast in female, estrogen receptor positive ([place]).   PREVIOUS INVESTIGATIONS:  Mammogram ultrasound reviewed Pathology report reviewed Clinical notes reviewed  HPI: Patient is a 75 year old female who presented with an abnormal mammogram of the left breast showing suspicious mass at the 2 o'clock position for which ultrasound-guided biopsy was recommended.  That was performed and positive for invasive mammary carcinoma.  Mass was approximately 6 mm in the left upper outer quadrant.  Mass was 12 cm from the nipple at the 2 o'clock position.  No abnormal lymphadenopathy was noted.  She underwent a wide local excision showing a 5 mm overall grade 1 invasive mammary carcinoma solid papillary carcinoma with invasion.Marland Kitchen  4 sentinel lymph nodes were submitted all negative for malignancy.  Margins were clear at 1 cm.  Tumor was ER/PR positive HER2/neu not overexpressed.  She was seen by medical oncology based on the small focus of carcinoma Oncotype DX was not recommended or systemic chemotherapy she is seen today for radiation oncology opinion.  She is doing well.  She specifically denies breast tenderness cough or bone pain.  PLANNED TREATMENT REGIMEN: Hypofractionated left whole breast radiation  PAST MEDICAL HISTORY:  has a past medical history of Allergic state, Arthritis, Barrett's esophagus, Chronic cough, COPD (chronic obstructive pulmonary disease)  ([place]), Depression, Duodenitis, Esophageal motility disorder, Gastritis, GERD (gastroesophageal reflux disease), Headache, Pre-diabetes, Tobacco abuse disorder, and Varicose veins of legs.    PAST SURGICAL HISTORY:  Past Surgical History:  Procedure Laterality Date  . APPENDECTOMY    . BREAST BIOPSY Left 01/02/2021   [facility] WITH FEATURES SUGGESTIVE OF SOLID PSEUDOPAPILLARY CARCINOMA  . BREAST LUMPECTOMY,RADIO FREQ LOCALIZER,AXILLARY SENTINEL LYMPH NODE BIOPSY Left 01/28/2021   Procedure: BREAST LUMPECTOMY,RADIO FREQ LOCALIZER,AXILLARY SENTINEL LYMPH NODE BIOPSY;  Surgeon: Herbert Pun, MD;  Location: [facility] ORS;  Service: General;  Laterality: Left;  . CATARACT EXTRACTION W/PHACO Right 01/03/2017   Procedure: CATARACT EXTRACTION PHACO AND INTRAOCULAR LENS PLACEMENT ([place])  Right;  Surgeon: Ronnell Freshwater, MD;  Location: [place];  Service: Ophthalmology;  Laterality: Right;  . CATARACT EXTRACTION W/PHACO Left 01/17/2017   Procedure: CATARACT EXTRACTION PHACO AND INTRAOCULAR LENS PLACEMENT (IOC);  Surgeon: Ronnell Freshwater, MD;  Location: [facility];  Service: Ophthalmology;  Laterality: Left;  left  . CHOLECYSTECTOMY    . COLONOSCOPY WITH PROPOFOL N/A 04/06/2018   Procedure: COLONOSCOPY WITH PROPOFOL;  Surgeon: Lollie Sails, MD;  Location[facility] END[facility]COPY;  Service: Endoscopy;  Laterality: N/A;  . ESOPHAGOGASTRODUODENOSCOPY N/A 09/26/2018   Procedure: ESOPHAGOGASTRODUODENOSCOPY (EGD);  Surgeon: Lollie Sails, MD;  Location[facility]COPY;  Service: Endoscopy;  Laterality: N/A;  . ESOPHAGOGASTRODUODENOSCOPY (EGD) WITH PROPOFOL N/A 10/14/2015   Procedure: ESOPHAGOGASTRODUODENOSCOPY (EGD) WITH PROPOFOL;  Surgeon: Lollie Sails, MD;  Location: [facility] ENDOSCOPY;  Service: Endoscopy;  Laterality: N/A;  . EYE SURGERY Bilateral 2019   cataracts   . HERNIA REPAIR      FAMILY HISTORY: family history includes  Cancer in her maternal grandmother, mother,  and sister; Dementia in her father; Diabetes in her maternal grandfather; Rectal cancer in her daughter; Stroke in her maternal grandfather.  SOCIAL HISTORY:  reports that she has been smoking. She has a 25.00 pack-year smoking history. She has never used smokeless tobacco. She reports current alcohol use of about 1.0 standard drink of alcohol per week. She reports that she does not use drugs.  ALLERGIES: Tape, Bacitracin, and Other  MEDICATIONS:  Current Outpatient Medications  Medication Sig Dispense Refill  . ALPRAZolam (XANAX) 0.25 MG tablet Take 0.25 mg by mouth 2 (two) times daily as needed for anxiety.    Marland Kitchen buPROPion (WELLBUTRIN SR) 150 MG 12 hr tablet Take 150 mg by mouth daily.    . cetirizine (ZYRTEC) 10 MG tablet Take 10 mg by mouth daily as needed for allergies.    . clotrimazole-betamethasone (LOTRISONE) cream Apply 1 application topically 2 (two) times daily.    . fluticasone (FLONASE) 50 MCG/ACT nasal spray Place 2 sprays into both nostrils daily as needed for allergies.    Marland Kitchen gabapentin (NEURONTIN) 100 MG capsule Take 100 mg by mouth 3 (three) times daily. Two capsules 3 times a day as needed    . lidocaine-prilocaine (EMLA) cream Apply 1 application topically as needed.    Marland Kitchen omeprazole (PRILOSEC) 40 MG capsule Take 40 mg by mouth in the morning and at bedtime.    . ondansetron (ZOFRAN) 4 MG tablet Take 4 mg by mouth 2 (two) times daily as needed for nausea/vomiting.  0  . Polyethyl Glycol-Propyl Glycol (SYSTANE) 0.4-0.3 % SOLN Place 1-2 drops into both eyes 3 (three) times daily as needed (dry/irritated eyes).    . sertraline (ZOLOFT) 50 MG tablet Take 50 mg by mouth daily.    Marland Kitchen SPIRIVA HANDIHALER 18 MCG inhalation capsule Place 1 capsule into inhaler and inhale in the morning.    . furosemide (LASIX) 20 MG tablet Take 20 mg by mouth as needed for fluid.  (Patient not taking: No sig reported)    . peppermint oil liquid Take 1 capsule by mouth in the morning, at noon, and at  bedtime. PEPPERMINT OIL WITH GINGER & FENNEL (Patient not taking: No sig reported)    . senna-docusate (SENOKOT-S) 8.6-50 MG tablet Take 1 tablet by mouth daily. (Patient not taking: No sig reported) 30 tablet 0   No current facility-administered medications for this encounter.    ECOG PERFORMANCE STATUS:  0 - Asymptomatic  REVIEW OF SYSTEMS: Patient denies any weight loss, fatigue, weakness, fever, chills or night sweats. Patient denies any loss of vision, blurred vision. Patient denies any ringing  of the ears or hearing loss. No irregular heartbeat. Patient denies heart murmur or history of fainting. Patient denies any chest pain or pain radiating to her upper extremities. Patient denies any shortness of breath, difficulty breathing at night, cough or hemoptysis. Patient denies any swelling in the lower legs. Patient denies any nausea vomiting, vomiting of blood, or coffee ground material in the vomitus. Patient denies any stomach pain. Patient states has had normal bowel movements no significant constipation or diarrhea. Patient denies any dysuria, hematuria or significant nocturia. Patient denies any problems walking, swelling in the joints or loss of balance. Patient denies any skin changes, loss of hair or loss of weight. Patient denies any excessive worrying or anxiety or significant depression. Patient denies any problems with insomnia. Patient denies excessive thirst, polyuria, polydipsia. Patient denies any swollen glands, patient denies easy bruising  or easy bleeding. Patient denies any recent infections, allergies or URI. Patient "s visual fields have not changed significantly in recent time.   PHYSICAL EXAM: BP (!) 146/82   Pulse 89   Temp (!) 96.1 F (35.6 C) (Tympanic)   Wt 149 lb 11.2 oz (67.9 kg)   BMI 23.45 kg/m  She is status post wide local excision of the left breast.  Incisions healed well no dominant masses noted in either breast.  No axillary or supraclavicular adenopathy  is appreciated.  Well-developed well-nourished patient in NAD. HEENT reveals PERLA, EOMI, discs not visualized.  Oral cavity is clear. No oral mucosal lesions are identified. Neck is clear without evidence of cervical or supraclavicular adenopathy. Lungs are clear to A&P. Cardiac examination is essentially unremarkable with regular rate and rhythm without murmur rub or thrill. Abdomen is benign with no organomegaly or masses noted. Motor sensory and DTR levels are equal and symmetric in the upper and lower extremities. Cranial nerves II through XII are grossly intact. Proprioception is intact. No peripheral adenopathy or edema is identified. No motor or sensory levels are noted. Crude visual fields are within normal range.  LABORATORY DATA: Pathology reports reviewed compatible with above-stated findings    RADIOLOGY RESULTS: Mammogram and ultrasound reviewed compatible with above-stated findings   IMPRESSION: Stage I (T1 aN0 M0) solid papillary carcinoma with invasion of the left breast status post wide local excision and sentinel node biopsy ER/PR positive  PLAN: This time of recommended a course of hypofractionated radiation therapy over 3 weeks to her left breast.  Would also boost her scar another 1000 cGy using electron beam.  Risks and benefits of treatment including skin reaction fatigue alteration of blood Rademaker possible occlusion of superficial lung all were discussed in detail with the patient.  She is rather large breasted although I still believe we can use a hypofractionated course of treatment with possibly some more significant skin reaction.  I have personally set up and ordered CT simulation for early next week.  Patient also will benefit from antiestrogen therapy after completion of radiation.  Patient comprehends my recommendations well.  I would like to take this opportunity to thank you for allowing me to participate in the care of your patient.Noreene Filbert,  MD

## 2021-02-20 ENCOUNTER — Ambulatory Visit: Payer: Medicare HMO | Admitting: Radiation Oncology

## 2021-02-23 ENCOUNTER — Ambulatory Visit
Admission: RE | Admit: 2021-02-23 | Discharge: 2021-02-23 | Disposition: A | Discharge status: Home / Self Care | Payer: Medicare HMO | Source: Ambulatory Visit | Attending: Radiation Oncology | Admitting: Radiation Oncology

## 2021-02-23 DIAGNOSIS — C50812 Malignant neoplasm of overlapping sites of left female breast: Secondary | ICD-10-CM | POA: Diagnosis not present

## 2021-02-23 DIAGNOSIS — J449 Chronic obstructive pulmonary disease, unspecified: Secondary | ICD-10-CM | POA: Diagnosis not present

## 2021-02-23 DIAGNOSIS — K219 Gastro-esophageal reflux disease without esophagitis: Secondary | ICD-10-CM | POA: Diagnosis not present

## 2021-02-23 DIAGNOSIS — M129 Arthropathy, unspecified: Secondary | ICD-10-CM | POA: Diagnosis not present

## 2021-02-23 DIAGNOSIS — Z170 Estrogen receptor positive status [ER+]: Secondary | ICD-10-CM | POA: Diagnosis not present

## 2021-02-23 DIAGNOSIS — K2270 Barrett's esophagus without dysplasia: Secondary | ICD-10-CM | POA: Diagnosis not present

## 2021-02-23 DIAGNOSIS — F17210 Nicotine dependence, cigarettes, uncomplicated: Secondary | ICD-10-CM | POA: Diagnosis not present

## 2021-02-23 DIAGNOSIS — Z809 Family history of malignant neoplasm, unspecified: Secondary | ICD-10-CM | POA: Diagnosis not present

## 2021-02-23 DIAGNOSIS — Z79899 Other long term (current) drug therapy: Secondary | ICD-10-CM | POA: Diagnosis not present

## 2021-02-24 DIAGNOSIS — Z170 Estrogen receptor positive status [ER+]: Secondary | ICD-10-CM | POA: Diagnosis not present

## 2021-02-24 DIAGNOSIS — K2270 Barrett's esophagus without dysplasia: Secondary | ICD-10-CM | POA: Diagnosis not present

## 2021-02-24 DIAGNOSIS — C50812 Malignant neoplasm of overlapping sites of left female breast: Secondary | ICD-10-CM | POA: Diagnosis not present

## 2021-02-24 DIAGNOSIS — M129 Arthropathy, unspecified: Secondary | ICD-10-CM | POA: Diagnosis not present

## 2021-02-24 DIAGNOSIS — J449 Chronic obstructive pulmonary disease, unspecified: Secondary | ICD-10-CM | POA: Diagnosis not present

## 2021-02-24 DIAGNOSIS — Z809 Family history of malignant neoplasm, unspecified: Secondary | ICD-10-CM | POA: Diagnosis not present

## 2021-02-24 DIAGNOSIS — F17210 Nicotine dependence, cigarettes, uncomplicated: Secondary | ICD-10-CM | POA: Diagnosis not present

## 2021-02-24 DIAGNOSIS — Z79899 Other long term (current) drug therapy: Secondary | ICD-10-CM | POA: Diagnosis not present

## 2021-02-24 DIAGNOSIS — K219 Gastro-esophageal reflux disease without esophagitis: Secondary | ICD-10-CM | POA: Diagnosis not present

## 2021-02-27 ENCOUNTER — Other Ambulatory Visit: Payer: Self-pay | Admitting: *Deleted

## 2021-02-27 DIAGNOSIS — Z170 Estrogen receptor positive status [ER+]: Secondary | ICD-10-CM

## 2021-02-27 DIAGNOSIS — C50812 Malignant neoplasm of overlapping sites of left female breast: Secondary | ICD-10-CM

## 2021-03-02 ENCOUNTER — Ambulatory Visit: Admission: RE | Admit: 2021-03-02 | Payer: Medicare HMO | Source: Ambulatory Visit

## 2021-03-02 DIAGNOSIS — K219 Gastro-esophageal reflux disease without esophagitis: Secondary | ICD-10-CM | POA: Diagnosis not present

## 2021-03-02 DIAGNOSIS — J449 Chronic obstructive pulmonary disease, unspecified: Secondary | ICD-10-CM | POA: Diagnosis not present

## 2021-03-02 DIAGNOSIS — C50812 Malignant neoplasm of overlapping sites of left female breast: Secondary | ICD-10-CM | POA: Insufficient documentation

## 2021-03-02 DIAGNOSIS — F17210 Nicotine dependence, cigarettes, uncomplicated: Secondary | ICD-10-CM | POA: Diagnosis not present

## 2021-03-02 DIAGNOSIS — M129 Arthropathy, unspecified: Secondary | ICD-10-CM | POA: Insufficient documentation

## 2021-03-02 DIAGNOSIS — K2270 Barrett's esophagus without dysplasia: Secondary | ICD-10-CM | POA: Insufficient documentation

## 2021-03-02 DIAGNOSIS — Z809 Family history of malignant neoplasm, unspecified: Secondary | ICD-10-CM | POA: Insufficient documentation

## 2021-03-02 DIAGNOSIS — Z170 Estrogen receptor positive status [ER+]: Secondary | ICD-10-CM | POA: Diagnosis not present

## 2021-03-02 DIAGNOSIS — Z79899 Other long term (current) drug therapy: Secondary | ICD-10-CM | POA: Diagnosis not present

## 2021-03-03 ENCOUNTER — Ambulatory Visit
Admission: RE | Admit: 2021-03-03 | Discharge: 2021-03-03 | Disposition: A | Discharge status: Home / Self Care | Payer: Medicare HMO | Source: Ambulatory Visit | Attending: Radiation Oncology | Admitting: Radiation Oncology

## 2021-03-03 ENCOUNTER — Inpatient Hospital Stay: Payer: Medicare HMO | Attending: Oncology | Admitting: Licensed Clinical Social Worker

## 2021-03-03 ENCOUNTER — Encounter: Payer: Self-pay | Admitting: Licensed Clinical Social Worker

## 2021-03-03 ENCOUNTER — Inpatient Hospital Stay: Payer: Medicare HMO

## 2021-03-03 DIAGNOSIS — Z170 Estrogen receptor positive status [ER+]: Secondary | ICD-10-CM | POA: Insufficient documentation

## 2021-03-03 DIAGNOSIS — Z800 Family history of malignant neoplasm of digestive organs: Secondary | ICD-10-CM | POA: Diagnosis not present

## 2021-03-03 DIAGNOSIS — F17210 Nicotine dependence, cigarettes, uncomplicated: Secondary | ICD-10-CM | POA: Diagnosis not present

## 2021-03-03 DIAGNOSIS — C50812 Malignant neoplasm of overlapping sites of left female breast: Secondary | ICD-10-CM

## 2021-03-03 DIAGNOSIS — Z79899 Other long term (current) drug therapy: Secondary | ICD-10-CM | POA: Diagnosis not present

## 2021-03-03 DIAGNOSIS — M129 Arthropathy, unspecified: Secondary | ICD-10-CM | POA: Diagnosis not present

## 2021-03-03 DIAGNOSIS — Z801 Family history of malignant neoplasm of trachea, bronchus and lung: Secondary | ICD-10-CM | POA: Diagnosis not present

## 2021-03-03 DIAGNOSIS — Z809 Family history of malignant neoplasm, unspecified: Secondary | ICD-10-CM | POA: Diagnosis not present

## 2021-03-03 DIAGNOSIS — Z808 Family history of malignant neoplasm of other organs or systems: Secondary | ICD-10-CM | POA: Diagnosis not present

## 2021-03-03 DIAGNOSIS — K2270 Barrett's esophagus without dysplasia: Secondary | ICD-10-CM | POA: Diagnosis not present

## 2021-03-03 DIAGNOSIS — K219 Gastro-esophageal reflux disease without esophagitis: Secondary | ICD-10-CM | POA: Diagnosis not present

## 2021-03-03 DIAGNOSIS — J449 Chronic obstructive pulmonary disease, unspecified: Secondary | ICD-10-CM | POA: Diagnosis not present

## 2021-03-03 NOTE — Progress Notes (Signed)
REFERRING PROVIDER: Borders, Kirt Boys, NP [place],  [place] [address]  PRIMARY PROVIDER:  Tracie Harrier, MD  PRIMARY REASON FOR VISIT:  1. Carcinoma of overlapping sites of left breast in female, estrogen receptor positive ([place])   2. Family history of colon cancer   3. Family history of melanoma   4. Family history of brain cancer   5. Family history of lung cancer   6. Family history of adrenal cancer     HISTORY OF PRESENT ILLNESS:   Morgan Valdez, a 75 y.o. female, was seen for a [place] cancer genetics consultation at the request of Dr. Rogue Bussing due to a personal and family history of cancer.  Morgan Valdez presents to clinic today to discuss the possibility of a hereditary predisposition to cancer, genetic testing, and to further clarify her future cancer risks, as well as potential cancer risks for family members.   In 2022, at the age of 19, Morgan Valdez was diagnosed with invasive mammary carcinoma of the left breast, ER/PR+, Her2-. The treatment plan included lumpectomy and she just started radiation treatment today, 5/3.   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; ER >90%; PR > 90%; he 2 Neu-NEG [Dr.Cinton]  PATHOLOGIC STAGE CLASSIFICATION (pTNM, AJCC 8th Edition):  TNM Descriptors: Not applicable  [id number]  Regional Lymph Nodes Modifier: sn  pN0  pM - Not applicable   SPECIAL STUDIES  Breast Biomarker Testing Performed on Previous Biopsy: [id number]  Estrogen Receptor (ER) Status: POSITIVE          Percentage of cells with nuclear positivity: Greater than 90%          Average intensity of staining: Strong   Progesterone Receptor (PgR) Status: POSITIVE          Percentage of cells with nuclear positivity: Greater than 90%          Average intensity of staining: Moderate   HER2 (by immunohistochemistry):  NEGATIVE (Score 1+)  #Cancer screening : colonoscopy [since 56 years; again aug 2022];smoker;        Carcinoma of overlapping sites of left breast in female, estrogen receptor positive ([place])  01/19/2021 Initial Diagnosis   Carcinoma of overlapping sites of left breast in female, estrogen receptor positive ([place])   01/19/2021 Cancer Staging   Staging form: Breast, AJCC 8th Edition - Clinical: Stage IA (cT1a, cN0, cM0, G2, ER+, PR+, HER2-) - Signed by Cammie Sickle, MD on 01/19/2021 Histologic grading system: 3 grade system      RISK FACTORS:  Menarche was at age 69.  Ovaries intact: yes.  Hysterectomy: no.  Menopausal status: postmenopausal.  Colonoscopy: yes; >10 polyps. Mammogram within the last year: yes. Number of breast biopsies: 1.   Past Medical History:  Diagnosis Date  . Allergic state   . Arthritis   . Barrett's esophagus   . Chronic cough   . COPD (chronic obstructive pulmonary disease) ([place])   . Depression   . Duodenitis   . Esophageal motility disorder   . Family history of adrenal cancer   . Family history of brain cancer   . Family history of colon cancer   . Family history of lung cancer   . Family history of melanoma   . Gastritis   . GERD (gastroesophageal reflux disease)   . Headache    migraines - 3-4x/yr  . Pre-diabetes   .  Tobacco abuse disorder   . Varicose veins of legs     Past Surgical History:  Procedure Laterality Date  . APPENDECTOMY    . BREAST BIOPSY Left 01/02/2021   IMC WITH FEATURES SUGGESTIVE OF SOLID PSEUDOPAPILLARY CARCINOMA  . BREAST LUMPECTOMY,RADIO FREQ LOCALIZER,AXILLARY SENTINEL LYMPH NODE BIOPSY Left 01/28/2021   Procedure: BREAST LUMPECTOMY,RADIO FREQ LOCALIZER,AXILLARY SENTINEL LYMPH NODE BIOPSY;  Surgeon: Herbert Pun, MD;  Location: [facility] ORS;  Service: General;  Laterality: Left;  . CATARACT EXTRACTION W/PHACO Right 01/03/2017   Procedure: CATARACT EXTRACTION PHACO AND INTRAOCULAR LENS PLACEMENT  ([place])  Right;  Surgeon: Ronnell Freshwater, MD;  Location: [place];  Service: Ophthalmology;  Laterality: Right;  . CATARACT EXTRACTION W/PHACO Left 01/17/2017   Procedure: CATARACT EXTRACTION PHACO AND INTRAOCULAR LENS PLACEMENT (I[facility]);  Surgeon: Ronnell Freshwater, MD;  Location: [facility];  Service: Ophthalmology;  Laterality: Left;  left  . CHOLECYSTECTOMY    . COLONOSCOPY WITH PROPOFOL N/A 04/06/2018   Procedure: COLONOSCOPY WITH PROPOFOL;  Surgeon: Lollie Sails, MD;  Location[facility] ENDOSCOPY;  Service: Endoscopy;  Laterality: N/A;  . ESOPHAGOGASTRODUODENOSCOPY N/A 09/26/2018   Procedure: ESOPHAGOGASTRODUODENOSCOPY (EGD);  Surgeon: Lollie Sails, MD;  Location: [facility] ENDOSCOPY;  Service: Endoscopy;  Laterality: N/A;  . ESOPHAGOGASTRODUODENOSCOPY (EGD) WITH PROPOFOL N/A 10/14/2015   Procedure: ESOPHAGOGASTRODUODENOSCOPY (EGD) WITH PROPOFOL;  Surgeon: Lollie Sails, MD;  Location: [facility] ENDOSCOPY;  Service: Endoscopy;  Laterality: N/A;  . EYE SURGERY Bilateral 2019   cataracts   . HERNIA REPAIR      Social History   Socioeconomic History  . Marital status: Widowed    Spouse name: Not on file  . Number of children: Not on file  . Years of education: Not on file  . Highest education level: Not on file  Occupational History  . Not on file  Tobacco Use  . Smoking status: Current Every Day Smoker    Packs/day: 0.50    Years: 50.00    Pack years: 25.00  . Smokeless tobacco: Never Used  Vaping Use  . Vaping Use: Never used  Substance and Sexual Activity  . Alcohol use: Yes    Alcohol/week: 1.0 standard drink    Types: 1 Glasses of wine per week    Comment: rare - Holidays  . Drug use: No  . Sexual activity: Not on file  Other Topics Concern  . 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  Significant diagnoses are listed below: Family History  Problem Relation Age of Onset  . Cancer Mother        esophagus, spine. adrenal gland; lung.   . Dementia Father   . Cancer Sister        colon cancer- 63s to liver.   . Cancer Maternal Grandmother        brain cancer  . Diabetes Maternal Grandfather   . Stroke Maternal Grandfather   . Rectal cancer Daughter        at 68 years; survived.   . Melanoma Grandson        dx  22 on neck  . Breast cancer Neg Hx    Morgan Valdez has 2 daughters and 1 son. One of her daughters recently had rectal cancer at 2, she lives in [place]. Her son, patient's grandson, had melanoma at 20 and is living at 19.  Morgan Valdez mother had esophageal, lung, spine, and adrenal cancers and died at 74. Patient had 2 maternal uncles, neither had cancer, neither had children. Patient's maternal grandmother had brain cancer and passed over 61, grandfather passed over 35 of a stroke.   Morgan Valdez's father died over the age of 22, limited information on this side of the family. No known cancers.   Morgan Valdez is unaware of previous family history of genetic testing for hereditary cancer risks. Patient's maternal ancestors are of Irish/German descent, and paternal ancestors are of Irish/German descent. There is no reported Ashkenazi Jewish ancestry. There is no known consanguinity.    GENETIC COUNSELING ASSESSMENT: Morgan Valdez is a 75 y.o. female with a personal history of breast cancer and family history of colorectal cancer which is somewhat suggestive of a hereditary cancer syndrome and predisposition to cancer. We, therefore, discussed and recommended the following at today's visit.   DISCUSSION: We discussed that  approximately 5-10% of breast cancer is hereditary  Most cases of hereditary breast cancer are associated with BRCA1/BRCA2 genes, although there are other genes associated with hereditary breast cancer as well including CHEK2, which also increases the risk of colorectal cancer. Other genes such as the Lynch syndrome genes also increase colorectal cancer risk. There are other genes we can test related to other types of cancers, cancer types/risks are gene specific. We discussed that testing is beneficial for several reasons including knowing about other cancer risks, identifying potential screening and risk-reduction options that may be appropriate, and to understand if other family members could be at risk for cancer and allow them to undergo genetic testing.   We reviewed the characteristics, features and inheritance patterns of hereditary cancer syndromes. We also discussed genetic testing, including the appropriate family members to test, the process of testing, insurance coverage and turn-around-time for results. We discussed the implications of a negative, positive and/or variant of uncertain significant result. We recommended Morgan Valdez pursue genetic testing for the [facility] Multi-Cancer+RNA gene panel.   The Multi-Cancer Panel + RNA offered by [facility] includes sequencing and/or deletion duplication testing of the following 84 genes: AIP, ALK, APC, ATM, AXIN2,BAP1,  BARD1, BLM, BMPR1A, BRCA1, BRCA2, BRIP1, CASR, CDC73, CDH1, CDK4, CDKN1B, CDKN1C, CDKN2A (p14ARF), CDKN2A (p16INK4a), CEBPA, CHEK2, CTNNA1, DICER1, DIS3L2, EGFR (c.2369C>T, p.Thr790Met variant only), EPCAM (Deletion/duplication testing only), FH, FLCN, GATA2, GPC3, GREM1 (Promoter region deletion/duplication testing only), HOXB13 (c.251G>A, p.Gly84Glu), HRAS, KIT, MAX, MEN1, MET, MITF (c.952G>A, p.Glu318Lys variant only), MLH1, MSH2, MSH3, MSH6, MUTYH, NBN, NF1, NF2, NTHL1, PALB2, PDGFRA, PHOX2B, PMS2, POLD1, POLE, POT1, PRKAR1A, PTCH1, PTEN,  RAD50, RAD51C, RAD51D, RB1, RECQL4, RET, RUNX1, SDHAF2, SDHA (sequence changes only), SDHB, SDHC, SDHD, SMAD4, SMARCA4, SMARCB1, SMARCE1, STK11, SUFU, TERC, TERT, TMEM127, TP53, TSC1, TSC2, VHL, WRN and WT1.  Based on Morgan Valdez's personal and family history of cancer, she meets medical criteria for genetic testing. Despite that she meets criteria, she may still have an out of pocket cost. We discussed that if her out of pocket cost for testing is over $100, the laboratory will call and confirm whether she wants to proceed with testing.  If the out of pocket cost of testing is less than $100 she will be billed by the  genetic testing laboratory.   PLAN: After considering the risks, benefits, and limitations, Morgan Valdez is unsure if she'd like to pursue genetic testing, but she did have her blood drawn today and will let me know if she'd like to proceed with genetic testing. If she want testing, we will send the blood sample to [facility] for analysis of the Multi-Cancer Panel+RNA. Results should be available within approximately 2-3 weeks' time, at which point they will be disclosed by telephone to Morgan Valdez, as will any additional recommendations warranted by these results. Morgan Valdez will receive a summary of her genetic counseling visit and a copy of her results once available. This information will also be available in Epic.   Morgan Valdez questions were answered to her satisfaction today. Our contact information was provided should additional questions or concerns arise. Thank you for the referral and allowing [place] to share in the care of your patient.   Faith Rogue, MS, [facility] Genetic Counselor [email or web address] Phone: [phone]  The patient was seen for a total of 30 minutes in face-to-face genetic counseling.  Dr. Grayland Ormond was available for discussion regarding this case.   _______________________________________________________________________ For Office Staff:   Number of people involved in session: 1 Was an Intern/ student involved with case: no

## 2021-03-04 ENCOUNTER — Ambulatory Visit
Admission: RE | Admit: 2021-03-04 | Discharge: 2021-03-04 | Disposition: A | Discharge status: Home / Self Care | Payer: Medicare HMO | Source: Ambulatory Visit | Attending: Radiation Oncology | Admitting: Radiation Oncology

## 2021-03-04 DIAGNOSIS — C50812 Malignant neoplasm of overlapping sites of left female breast: Secondary | ICD-10-CM | POA: Diagnosis not present

## 2021-03-04 DIAGNOSIS — Z809 Family history of malignant neoplasm, unspecified: Secondary | ICD-10-CM | POA: Diagnosis not present

## 2021-03-04 DIAGNOSIS — J449 Chronic obstructive pulmonary disease, unspecified: Secondary | ICD-10-CM | POA: Diagnosis not present

## 2021-03-04 DIAGNOSIS — F17210 Nicotine dependence, cigarettes, uncomplicated: Secondary | ICD-10-CM | POA: Diagnosis not present

## 2021-03-04 DIAGNOSIS — Z170 Estrogen receptor positive status [ER+]: Secondary | ICD-10-CM | POA: Diagnosis not present

## 2021-03-04 DIAGNOSIS — Z79899 Other long term (current) drug therapy: Secondary | ICD-10-CM | POA: Diagnosis not present

## 2021-03-04 DIAGNOSIS — K2270 Barrett's esophagus without dysplasia: Secondary | ICD-10-CM | POA: Diagnosis not present

## 2021-03-04 DIAGNOSIS — K219 Gastro-esophageal reflux disease without esophagitis: Secondary | ICD-10-CM | POA: Diagnosis not present

## 2021-03-04 DIAGNOSIS — M129 Arthropathy, unspecified: Secondary | ICD-10-CM | POA: Diagnosis not present

## 2021-03-05 ENCOUNTER — Ambulatory Visit
Admission: RE | Admit: 2021-03-05 | Discharge: 2021-03-05 | Disposition: A | Discharge status: Home / Self Care | Payer: Medicare HMO | Source: Ambulatory Visit | Attending: Radiation Oncology | Admitting: Radiation Oncology

## 2021-03-05 DIAGNOSIS — F17210 Nicotine dependence, cigarettes, uncomplicated: Secondary | ICD-10-CM | POA: Diagnosis not present

## 2021-03-05 DIAGNOSIS — K2270 Barrett's esophagus without dysplasia: Secondary | ICD-10-CM | POA: Diagnosis not present

## 2021-03-05 DIAGNOSIS — J449 Chronic obstructive pulmonary disease, unspecified: Secondary | ICD-10-CM | POA: Diagnosis not present

## 2021-03-05 DIAGNOSIS — M129 Arthropathy, unspecified: Secondary | ICD-10-CM | POA: Diagnosis not present

## 2021-03-05 DIAGNOSIS — Z79899 Other long term (current) drug therapy: Secondary | ICD-10-CM | POA: Diagnosis not present

## 2021-03-05 DIAGNOSIS — Z809 Family history of malignant neoplasm, unspecified: Secondary | ICD-10-CM | POA: Diagnosis not present

## 2021-03-05 DIAGNOSIS — Z170 Estrogen receptor positive status [ER+]: Secondary | ICD-10-CM | POA: Diagnosis not present

## 2021-03-05 DIAGNOSIS — C50812 Malignant neoplasm of overlapping sites of left female breast: Secondary | ICD-10-CM | POA: Diagnosis not present

## 2021-03-05 DIAGNOSIS — K219 Gastro-esophageal reflux disease without esophagitis: Secondary | ICD-10-CM | POA: Diagnosis not present

## 2021-03-06 ENCOUNTER — Ambulatory Visit
Admission: RE | Admit: 2021-03-06 | Discharge: 2021-03-06 | Disposition: A | Discharge status: Home / Self Care | Payer: Medicare HMO | Source: Ambulatory Visit | Attending: Radiation Oncology | Admitting: Radiation Oncology

## 2021-03-06 DIAGNOSIS — C50812 Malignant neoplasm of overlapping sites of left female breast: Secondary | ICD-10-CM | POA: Diagnosis not present

## 2021-03-06 DIAGNOSIS — J449 Chronic obstructive pulmonary disease, unspecified: Secondary | ICD-10-CM | POA: Diagnosis not present

## 2021-03-06 DIAGNOSIS — Z809 Family history of malignant neoplasm, unspecified: Secondary | ICD-10-CM | POA: Diagnosis not present

## 2021-03-06 DIAGNOSIS — Z79899 Other long term (current) drug therapy: Secondary | ICD-10-CM | POA: Diagnosis not present

## 2021-03-06 DIAGNOSIS — K2270 Barrett's esophagus without dysplasia: Secondary | ICD-10-CM | POA: Diagnosis not present

## 2021-03-06 DIAGNOSIS — Z170 Estrogen receptor positive status [ER+]: Secondary | ICD-10-CM | POA: Diagnosis not present

## 2021-03-06 DIAGNOSIS — M129 Arthropathy, unspecified: Secondary | ICD-10-CM | POA: Diagnosis not present

## 2021-03-06 DIAGNOSIS — F17210 Nicotine dependence, cigarettes, uncomplicated: Secondary | ICD-10-CM | POA: Diagnosis not present

## 2021-03-06 DIAGNOSIS — K219 Gastro-esophageal reflux disease without esophagitis: Secondary | ICD-10-CM | POA: Diagnosis not present

## 2021-03-09 ENCOUNTER — Ambulatory Visit
Admission: RE | Admit: 2021-03-09 | Discharge: 2021-03-09 | Disposition: A | Discharge status: Home / Self Care | Payer: Medicare HMO | Source: Ambulatory Visit | Attending: Radiation Oncology | Admitting: Radiation Oncology

## 2021-03-09 DIAGNOSIS — F17210 Nicotine dependence, cigarettes, uncomplicated: Secondary | ICD-10-CM | POA: Diagnosis not present

## 2021-03-09 DIAGNOSIS — J449 Chronic obstructive pulmonary disease, unspecified: Secondary | ICD-10-CM | POA: Diagnosis not present

## 2021-03-09 DIAGNOSIS — C50812 Malignant neoplasm of overlapping sites of left female breast: Secondary | ICD-10-CM | POA: Diagnosis not present

## 2021-03-09 DIAGNOSIS — M129 Arthropathy, unspecified: Secondary | ICD-10-CM | POA: Diagnosis not present

## 2021-03-09 DIAGNOSIS — Z79899 Other long term (current) drug therapy: Secondary | ICD-10-CM | POA: Diagnosis not present

## 2021-03-09 DIAGNOSIS — Z809 Family history of malignant neoplasm, unspecified: Secondary | ICD-10-CM | POA: Diagnosis not present

## 2021-03-09 DIAGNOSIS — Z170 Estrogen receptor positive status [ER+]: Secondary | ICD-10-CM | POA: Diagnosis not present

## 2021-03-09 DIAGNOSIS — K219 Gastro-esophageal reflux disease without esophagitis: Secondary | ICD-10-CM | POA: Diagnosis not present

## 2021-03-09 DIAGNOSIS — K2270 Barrett's esophagus without dysplasia: Secondary | ICD-10-CM | POA: Diagnosis not present

## 2021-03-10 ENCOUNTER — Ambulatory Visit
Admission: RE | Admit: 2021-03-10 | Discharge: 2021-03-10 | Disposition: A | Discharge status: Home / Self Care | Payer: Medicare HMO | Source: Ambulatory Visit | Attending: Radiation Oncology | Admitting: Radiation Oncology

## 2021-03-10 DIAGNOSIS — M129 Arthropathy, unspecified: Secondary | ICD-10-CM | POA: Diagnosis not present

## 2021-03-10 DIAGNOSIS — Z809 Family history of malignant neoplasm, unspecified: Secondary | ICD-10-CM | POA: Diagnosis not present

## 2021-03-10 DIAGNOSIS — F17210 Nicotine dependence, cigarettes, uncomplicated: Secondary | ICD-10-CM | POA: Diagnosis not present

## 2021-03-10 DIAGNOSIS — J449 Chronic obstructive pulmonary disease, unspecified: Secondary | ICD-10-CM | POA: Diagnosis not present

## 2021-03-10 DIAGNOSIS — Z170 Estrogen receptor positive status [ER+]: Secondary | ICD-10-CM | POA: Diagnosis not present

## 2021-03-10 DIAGNOSIS — K2270 Barrett's esophagus without dysplasia: Secondary | ICD-10-CM | POA: Diagnosis not present

## 2021-03-10 DIAGNOSIS — C50812 Malignant neoplasm of overlapping sites of left female breast: Secondary | ICD-10-CM | POA: Diagnosis not present

## 2021-03-10 DIAGNOSIS — R079 Chest pain, unspecified: Secondary | ICD-10-CM | POA: Diagnosis not present

## 2021-03-10 DIAGNOSIS — K219 Gastro-esophageal reflux disease without esophagitis: Secondary | ICD-10-CM | POA: Diagnosis not present

## 2021-03-10 DIAGNOSIS — M542 Cervicalgia: Secondary | ICD-10-CM | POA: Diagnosis not present

## 2021-03-10 DIAGNOSIS — Z79899 Other long term (current) drug therapy: Secondary | ICD-10-CM | POA: Diagnosis not present

## 2021-03-11 ENCOUNTER — Ambulatory Visit
Admission: RE | Admit: 2021-03-11 | Discharge: 2021-03-11 | Disposition: A | Discharge status: Home / Self Care | Payer: Medicare HMO | Source: Ambulatory Visit | Attending: Radiation Oncology | Admitting: Radiation Oncology

## 2021-03-11 DIAGNOSIS — Z79899 Other long term (current) drug therapy: Secondary | ICD-10-CM | POA: Diagnosis not present

## 2021-03-11 DIAGNOSIS — C50812 Malignant neoplasm of overlapping sites of left female breast: Secondary | ICD-10-CM | POA: Diagnosis not present

## 2021-03-11 DIAGNOSIS — F17210 Nicotine dependence, cigarettes, uncomplicated: Secondary | ICD-10-CM | POA: Diagnosis not present

## 2021-03-11 DIAGNOSIS — Z809 Family history of malignant neoplasm, unspecified: Secondary | ICD-10-CM | POA: Diagnosis not present

## 2021-03-11 DIAGNOSIS — K2270 Barrett's esophagus without dysplasia: Secondary | ICD-10-CM | POA: Diagnosis not present

## 2021-03-11 DIAGNOSIS — J449 Chronic obstructive pulmonary disease, unspecified: Secondary | ICD-10-CM | POA: Diagnosis not present

## 2021-03-11 DIAGNOSIS — M129 Arthropathy, unspecified: Secondary | ICD-10-CM | POA: Diagnosis not present

## 2021-03-11 DIAGNOSIS — K219 Gastro-esophageal reflux disease without esophagitis: Secondary | ICD-10-CM | POA: Diagnosis not present

## 2021-03-11 DIAGNOSIS — Z170 Estrogen receptor positive status [ER+]: Secondary | ICD-10-CM | POA: Diagnosis not present

## 2021-03-12 ENCOUNTER — Ambulatory Visit
Admission: RE | Admit: 2021-03-12 | Discharge: 2021-03-12 | Disposition: A | Discharge status: Home / Self Care | Payer: Medicare HMO | Source: Ambulatory Visit | Attending: Radiation Oncology | Admitting: Radiation Oncology

## 2021-03-12 DIAGNOSIS — K219 Gastro-esophageal reflux disease without esophagitis: Secondary | ICD-10-CM | POA: Diagnosis not present

## 2021-03-12 DIAGNOSIS — Z809 Family history of malignant neoplasm, unspecified: Secondary | ICD-10-CM | POA: Diagnosis not present

## 2021-03-12 DIAGNOSIS — Z79899 Other long term (current) drug therapy: Secondary | ICD-10-CM | POA: Diagnosis not present

## 2021-03-12 DIAGNOSIS — K2270 Barrett's esophagus without dysplasia: Secondary | ICD-10-CM | POA: Diagnosis not present

## 2021-03-12 DIAGNOSIS — C50812 Malignant neoplasm of overlapping sites of left female breast: Secondary | ICD-10-CM | POA: Diagnosis not present

## 2021-03-12 DIAGNOSIS — Z170 Estrogen receptor positive status [ER+]: Secondary | ICD-10-CM | POA: Diagnosis not present

## 2021-03-12 DIAGNOSIS — F17210 Nicotine dependence, cigarettes, uncomplicated: Secondary | ICD-10-CM | POA: Diagnosis not present

## 2021-03-12 DIAGNOSIS — J449 Chronic obstructive pulmonary disease, unspecified: Secondary | ICD-10-CM | POA: Diagnosis not present

## 2021-03-12 DIAGNOSIS — M129 Arthropathy, unspecified: Secondary | ICD-10-CM | POA: Diagnosis not present

## 2021-03-13 ENCOUNTER — Ambulatory Visit
Admission: RE | Admit: 2021-03-13 | Discharge: 2021-03-13 | Disposition: A | Discharge status: Home / Self Care | Payer: Medicare HMO | Source: Ambulatory Visit | Attending: Radiation Oncology | Admitting: Radiation Oncology

## 2021-03-13 DIAGNOSIS — C50812 Malignant neoplasm of overlapping sites of left female breast: Secondary | ICD-10-CM | POA: Diagnosis not present

## 2021-03-13 DIAGNOSIS — M129 Arthropathy, unspecified: Secondary | ICD-10-CM | POA: Diagnosis not present

## 2021-03-13 DIAGNOSIS — Z170 Estrogen receptor positive status [ER+]: Secondary | ICD-10-CM | POA: Diagnosis not present

## 2021-03-13 DIAGNOSIS — Z809 Family history of malignant neoplasm, unspecified: Secondary | ICD-10-CM | POA: Diagnosis not present

## 2021-03-13 DIAGNOSIS — K2270 Barrett's esophagus without dysplasia: Secondary | ICD-10-CM | POA: Diagnosis not present

## 2021-03-13 DIAGNOSIS — Z79899 Other long term (current) drug therapy: Secondary | ICD-10-CM | POA: Diagnosis not present

## 2021-03-13 DIAGNOSIS — K219 Gastro-esophageal reflux disease without esophagitis: Secondary | ICD-10-CM | POA: Diagnosis not present

## 2021-03-13 DIAGNOSIS — J449 Chronic obstructive pulmonary disease, unspecified: Secondary | ICD-10-CM | POA: Diagnosis not present

## 2021-03-13 DIAGNOSIS — F17210 Nicotine dependence, cigarettes, uncomplicated: Secondary | ICD-10-CM | POA: Diagnosis not present

## 2021-03-16 ENCOUNTER — Ambulatory Visit
Admission: RE | Admit: 2021-03-16 | Discharge: 2021-03-16 | Disposition: A | Discharge status: Home / Self Care | Payer: Medicare HMO | Source: Ambulatory Visit | Attending: Radiation Oncology | Admitting: Radiation Oncology

## 2021-03-16 DIAGNOSIS — F17210 Nicotine dependence, cigarettes, uncomplicated: Secondary | ICD-10-CM | POA: Diagnosis not present

## 2021-03-16 DIAGNOSIS — Z170 Estrogen receptor positive status [ER+]: Secondary | ICD-10-CM | POA: Diagnosis not present

## 2021-03-16 DIAGNOSIS — C50812 Malignant neoplasm of overlapping sites of left female breast: Secondary | ICD-10-CM | POA: Diagnosis not present

## 2021-03-16 DIAGNOSIS — Z79899 Other long term (current) drug therapy: Secondary | ICD-10-CM | POA: Diagnosis not present

## 2021-03-16 DIAGNOSIS — K219 Gastro-esophageal reflux disease without esophagitis: Secondary | ICD-10-CM | POA: Diagnosis not present

## 2021-03-16 DIAGNOSIS — Z809 Family history of malignant neoplasm, unspecified: Secondary | ICD-10-CM | POA: Diagnosis not present

## 2021-03-16 DIAGNOSIS — K2270 Barrett's esophagus without dysplasia: Secondary | ICD-10-CM | POA: Diagnosis not present

## 2021-03-16 DIAGNOSIS — J449 Chronic obstructive pulmonary disease, unspecified: Secondary | ICD-10-CM | POA: Diagnosis not present

## 2021-03-16 DIAGNOSIS — M129 Arthropathy, unspecified: Secondary | ICD-10-CM | POA: Diagnosis not present

## 2021-03-17 ENCOUNTER — Ambulatory Visit
Admission: RE | Admit: 2021-03-17 | Discharge: 2021-03-17 | Disposition: A | Discharge status: Home / Self Care | Payer: Medicare HMO | Source: Ambulatory Visit | Attending: Radiation Oncology | Admitting: Radiation Oncology

## 2021-03-17 DIAGNOSIS — K2270 Barrett's esophagus without dysplasia: Secondary | ICD-10-CM | POA: Diagnosis not present

## 2021-03-17 DIAGNOSIS — F17210 Nicotine dependence, cigarettes, uncomplicated: Secondary | ICD-10-CM | POA: Diagnosis not present

## 2021-03-17 DIAGNOSIS — Z79899 Other long term (current) drug therapy: Secondary | ICD-10-CM | POA: Diagnosis not present

## 2021-03-17 DIAGNOSIS — K219 Gastro-esophageal reflux disease without esophagitis: Secondary | ICD-10-CM | POA: Diagnosis not present

## 2021-03-17 DIAGNOSIS — Z809 Family history of malignant neoplasm, unspecified: Secondary | ICD-10-CM | POA: Diagnosis not present

## 2021-03-17 DIAGNOSIS — M129 Arthropathy, unspecified: Secondary | ICD-10-CM | POA: Diagnosis not present

## 2021-03-17 DIAGNOSIS — C50812 Malignant neoplasm of overlapping sites of left female breast: Secondary | ICD-10-CM | POA: Diagnosis not present

## 2021-03-17 DIAGNOSIS — Z170 Estrogen receptor positive status [ER+]: Secondary | ICD-10-CM | POA: Diagnosis not present

## 2021-03-17 DIAGNOSIS — J449 Chronic obstructive pulmonary disease, unspecified: Secondary | ICD-10-CM | POA: Diagnosis not present

## 2021-03-18 ENCOUNTER — Ambulatory Visit
Admission: RE | Admit: 2021-03-18 | Discharge: 2021-03-18 | Disposition: A | Discharge status: Home / Self Care | Payer: Medicare HMO | Source: Ambulatory Visit | Attending: Radiation Oncology | Admitting: Radiation Oncology

## 2021-03-18 ENCOUNTER — Encounter: Payer: Self-pay | Admitting: Licensed Clinical Social Worker

## 2021-03-18 ENCOUNTER — Inpatient Hospital Stay: Payer: Medicare HMO

## 2021-03-18 ENCOUNTER — Ambulatory Visit: Payer: Self-pay | Admitting: Licensed Clinical Social Worker

## 2021-03-18 ENCOUNTER — Telehealth: Payer: Self-pay | Admitting: Licensed Clinical Social Worker

## 2021-03-18 ENCOUNTER — Other Ambulatory Visit: Payer: Self-pay

## 2021-03-18 DIAGNOSIS — Z1379 Encounter for other screening for genetic and chromosomal anomalies: Secondary | ICD-10-CM

## 2021-03-18 DIAGNOSIS — J449 Chronic obstructive pulmonary disease, unspecified: Secondary | ICD-10-CM | POA: Diagnosis not present

## 2021-03-18 DIAGNOSIS — Z801 Family history of malignant neoplasm of trachea, bronchus and lung: Secondary | ICD-10-CM

## 2021-03-18 DIAGNOSIS — Z170 Estrogen receptor positive status [ER+]: Secondary | ICD-10-CM | POA: Diagnosis not present

## 2021-03-18 DIAGNOSIS — C50812 Malignant neoplasm of overlapping sites of left female breast: Secondary | ICD-10-CM

## 2021-03-18 DIAGNOSIS — F17210 Nicotine dependence, cigarettes, uncomplicated: Secondary | ICD-10-CM | POA: Diagnosis not present

## 2021-03-18 DIAGNOSIS — Z800 Family history of malignant neoplasm of digestive organs: Secondary | ICD-10-CM

## 2021-03-18 DIAGNOSIS — K2270 Barrett's esophagus without dysplasia: Secondary | ICD-10-CM | POA: Diagnosis not present

## 2021-03-18 DIAGNOSIS — K219 Gastro-esophageal reflux disease without esophagitis: Secondary | ICD-10-CM | POA: Diagnosis not present

## 2021-03-18 DIAGNOSIS — Z808 Family history of malignant neoplasm of other organs or systems: Secondary | ICD-10-CM

## 2021-03-18 DIAGNOSIS — Z79899 Other long term (current) drug therapy: Secondary | ICD-10-CM | POA: Diagnosis not present

## 2021-03-18 DIAGNOSIS — Z809 Family history of malignant neoplasm, unspecified: Secondary | ICD-10-CM | POA: Diagnosis not present

## 2021-03-18 DIAGNOSIS — M129 Arthropathy, unspecified: Secondary | ICD-10-CM | POA: Diagnosis not present

## 2021-03-18 LAB — CBC
HCT: 47.7 % — ABNORMAL HIGH (ref 36.0–46.0)
Hemoglobin: 15.9 g/dL — ABNORMAL HIGH (ref 12.0–15.0)
MCH: 31 pg (ref 26.0–34.0)
MCHC: 33.3 g/dL (ref 30.0–36.0)
MCV: 93 fL (ref 80.0–100.0)
Platelets: 236 10*3/uL (ref 150–400)
RBC: 5.13 MIL/uL — ABNORMAL HIGH (ref 3.87–5.11)
RDW: 13.3 % (ref 11.5–15.5)
WBC: 7.5 10*3/uL (ref 4.0–10.5)
nRBC: 0 % (ref 0.0–0.2)

## 2021-03-18 NOTE — Progress Notes (Signed)
HPI:  Ms. Bazaldua was previously seen in the [facility] due to a personal and family history of cancer and concerns regarding a hereditary predisposition to cancer. Please refer to our prior cancer genetics clinic note for more information regarding our discussion, assessment and recommendations, at the time. Ms. Lonigro recent genetic test results were disclosed to her, as were recommendations warranted by these results. These results and recommendations are discussed in more detail below.  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; ER >90%; PR > 90%; he 2 Neu-NEG [Dr.Cinton]  PATHOLOGIC STAGE CLASSIFICATION (pTNM, AJCC 8th Edition):  TNM Descriptors: Not applicable  [id number]  Regional Lymph Nodes Modifier: sn  pN0  pM - Not applicable   SPECIAL STUDIES  Breast Biomarker Testing Performed on Previous Biopsy: [id number]  Estrogen Receptor (ER) Status: POSITIVE          Percentage of cells with nuclear positivity: Greater than 90%          Average intensity of staining: Strong   Progesterone Receptor (PgR) Status: POSITIVE          Percentage of cells with nuclear positivity: Greater than 90%          Average intensity of staining: Moderate   HER2 (by immunohistochemistry): NEGATIVE (Score 1+)  #Cancer screening : colonoscopy [since 56 years; again aug 2022];smoker;        Carcinoma of overlapping sites of left breast in female, estrogen receptor positive ([place])  01/19/2021 Initial Diagnosis   Carcinoma of overlapping sites of left breast in female, estrogen receptor positive ([place])   01/19/2021 Cancer Staging   Staging form: Breast, AJCC 8th Edition - Clinical: Stage IA (cT1a, cN0, cM0, G2, ER+, PR+, HER2-) - Signed by Cammie Sickle, MD on 01/19/2021 Histologic grading system: 3 grade system    Genetic  Testing   Negative genetic testing. No pathogenic variants identified on the [facility] Multi-Cancer Panel+RNA. The report date is 03/18/2021.  The Multi-Cancer Panel + RNA offered by [facility] includes sequencing and/or deletion duplication testing of the following 84 genes: AIP, ALK, APC, ATM, AXIN2,BAP1,  BARD1, BLM, BMPR1A, BRCA1, BRCA2, BRIP1, CASR, CDC73, CDH1, CDK4, CDKN1B, CDKN1C, CDKN2A (p14ARF), CDKN2A (p16INK4a), CEBPA, CHEK2, CTNNA1, DICER1, DIS3L2, EGFR (c.2369C>T, p.Thr790Met variant only), EPCAM (Deletion/duplication testing only), FH, FLCN, GATA2, GPC3, GREM1 (Promoter region deletion/duplication testing only), HOXB13 (c.251G>A, p.Gly84Glu), HRAS, KIT, MAX, MEN1, MET, MITF (c.952G>A, p.Glu318Lys variant only), MLH1, MSH2, MSH3, MSH6, MUTYH, NBN, NF1, NF2, NTHL1, PALB2, PDGFRA, PHOX2B, PMS2, POLD1, POLE, POT1, PRKAR1A, PTCH1, PTEN, RAD50, RAD51C, RAD51D, RB1, RECQL4, RET, RUNX1, SDHAF2, SDHA (sequence changes only), SDHB, SDHC, SDHD, SMAD4, SMARCA4, SMARCB1, SMARCE1, STK11, SUFU, TERC, TERT, TMEM127, TP53, TSC1, TSC2, VHL, WRN and WT1.     FAMILY HISTORY:  We obtained a detailed, 4-generation family history.  Significant diagnoses are listed below: Family History  Problem Relation Age of Onset  . Cancer Mother        esophagus, spine. adrenal gland; lung.   . Dementia Father   . Cancer Sister        colon cancer- 27s to liver.   . Cancer Maternal Grandmother        brain cancer  . Diabetes Maternal Grandfather   . Stroke Maternal Grandfather   . Rectal cancer Daughter        at 10 years; survived.   . Melanoma Grandson  dx 22 on neck  . Breast cancer Neg Hx    Ms. Goertz has 2 daughters and 1 son. One of her daughters recently had rectal cancer at 49, she lives in [place]. Her son, patient's grandson, had melanoma at 58 and is living at 21.  Ms. Kuznicki mother had esophageal, lung, spine, and adrenal cancers and died at 79. Patient had 2 maternal uncles, neither had  cancer, neither had children. Patient's maternal grandmother had brain cancer and passed over 10, grandfather passed over 66 of a stroke.   Ms. Sudol's father died over the age of 70, limited information on this side of the family. No known cancers.   Ms. Brougher is unaware of previous family history of genetic testing for hereditary cancer risks. Patient's maternal ancestors are of Irish/German descent, and paternal ancestors are of Irish/German descent. There is no reported Ashkenazi Jewish ancestry. There is no known consanguinity.     GENETIC TEST RESULTS: Genetic testing reported out on 03/18/2021 through the Multi- cancer panel +RNA found no pathogenic mutations.   The Multi-Cancer Panel + RNA offered by [facility] includes sequencing and/or deletion duplication testing of the following 84 genes: AIP, ALK, APC, ATM, AXIN2,BAP1,  BARD1, BLM, BMPR1A, BRCA1, BRCA2, BRIP1, CASR, CDC73, CDH1, CDK4, CDKN1B, CDKN1C, CDKN2A (p14ARF), CDKN2A (p16INK4a), CEBPA, CHEK2, CTNNA1, DICER1, DIS3L2, EGFR (c.2369C>T, p.Thr790Met variant only), EPCAM (Deletion/duplication testing only), FH, FLCN, GATA2, GPC3, GREM1 (Promoter region deletion/duplication testing only), HOXB13 (c.251G>A, p.Gly84Glu), HRAS, KIT, MAX, MEN1, MET, MITF (c.952G>A, p.Glu318Lys variant only), MLH1, MSH2, MSH3, MSH6, MUTYH, NBN, NF1, NF2, NTHL1, PALB2, PDGFRA, PHOX2B, PMS2, POLD1, POLE, POT1, PRKAR1A, PTCH1, PTEN, RAD50, RAD51C, RAD51D, RB1, RECQL4, RET, RUNX1, SDHAF2, SDHA (sequence changes only), SDHB, SDHC, SDHD, SMAD4, SMARCA4, SMARCB1, SMARCE1, STK11, SUFU, TERC, TERT, TMEM127, TP53, TSC1, TSC2, VHL, WRN and WT1.  The test report has been scanned into EPIC and is located under the Molecular Pathology section of the Results Review tab.  A portion of the result report is included below for reference.     We discussed with Ms. Cerney that because current genetic testing is not perfect, it is possible there may be a gene mutation in one of  these genes that current testing cannot detect, but that chance is small.  We also discussed, that there could be another gene that has not yet been discovered, or that we have not yet tested, that is responsible for the cancer diagnoses in the family. It is also possible there is a hereditary cause for the cancer in the family that Ms. Abshier did not inherit and therefore was not identified in her testing.  Therefore, it is important to remain in touch with cancer genetics in the future so that we can continue to offer Ms. Bontempo the most up to date genetic testing.   ADDITIONAL GENETIC TESTING: We discussed with Ms. Shams that her genetic testing was fairly extensive.  If there are genes identified to increase cancer risk that can be analyzed in the future, we would be happy to discuss and coordinate this testing at that time.    CANCER SCREENING RECOMMENDATIONS: Ms. Leonhardt's test result is considered negative (normal).  This means that we have not identified a hereditary cause for her  personal and family history of cancer at this time. Most cancers happen by chance and this negative test suggests that her cancer may fall into this category.    While reassuring, this does not definitively rule out a hereditary predisposition to cancer. It is still  possible that there could be genetic mutations that are undetectable by current technology. There could be genetic mutations in genes that have not been tested or identified to increase cancer risk.  Therefore, it is recommended she continue to follow the cancer management and screening guidelines provided by her oncology and primary healthcare provider.   An individual's cancer risk and medical management are not determined by genetic test results alone. Overall cancer risk assessment incorporates additional factors, including personal medical history, family history, and any available genetic information that may result in a personalized plan for cancer  prevention and surveillance.  RECOMMENDATIONS FOR FAMILY MEMBERS:  Relatives in this family might be at some increased risk of developing cancer, over the general population risk, simply due to the family history of cancer.  We recommended female relatives in this family have a yearly mammogram beginning at age 32, or 59 years younger than the earliest onset of cancer, an annual clinical breast exam, and perform monthly breast self-exams. Female relatives in this family should also have a gynecological exam as recommended by their primary provider.  All family members should be referred for colonoscopy starting at age 8.    It is also possible there is a hereditary cause for the cancer in Ms. Oleary's family that she did not inherit and therefore was not identified in her.  Based on Ms. Edling's family history, we recommended those related to her half sibling with colon in her 53s have genetic counseling and testing. Ms. Wierenga will let us know if we can be of any assistance in coordinating genetic counseling and/or testing for these family members.  FOLLOW-UP: Lastly, we discussed with Ms. Basinski that cancer genetics is a rapidly advancing field and it is possible that new genetic tests will be appropriate for her and/or her family members in the future. We encouraged her to remain in contact with cancer genetics on an annual basis so we can update her personal and family histories and let her know of advances in cancer genetics that may benefit this family.   Our contact number was provided. Ms. Saintil questions were answered to her satisfaction, and she knows she is welcome to call us at anytime with additional questions or concerns.   Faith Rogue, MS, [facility] Genetic Counselor [email or web address] Phone: [phone]

## 2021-03-18 NOTE — Telephone Encounter (Signed)
Revealed negative genetic testing. This normal result is reassuring and indicates that it is unlikely Morgan Valdez's cancer is due to a hereditary cause.  It is unlikely that there is an increased risk of another cancer due to a mutation in one of these genes.  However, genetic testing is not perfect, and cannot definitively rule out a hereditary cause.  It will be important for her to keep in contact with genetics to learn if any additional testing may be needed in the future.

## 2021-03-19 ENCOUNTER — Ambulatory Visit: Payer: Medicare HMO

## 2021-03-20 ENCOUNTER — Ambulatory Visit
Admission: RE | Admit: 2021-03-20 | Discharge: 2021-03-20 | Disposition: A | Discharge status: Home / Self Care | Payer: Medicare HMO | Source: Ambulatory Visit | Attending: Radiation Oncology | Admitting: Radiation Oncology

## 2021-03-20 DIAGNOSIS — J449 Chronic obstructive pulmonary disease, unspecified: Secondary | ICD-10-CM | POA: Diagnosis not present

## 2021-03-20 DIAGNOSIS — Z79899 Other long term (current) drug therapy: Secondary | ICD-10-CM | POA: Diagnosis not present

## 2021-03-20 DIAGNOSIS — Z170 Estrogen receptor positive status [ER+]: Secondary | ICD-10-CM | POA: Diagnosis not present

## 2021-03-20 DIAGNOSIS — C50812 Malignant neoplasm of overlapping sites of left female breast: Secondary | ICD-10-CM | POA: Diagnosis not present

## 2021-03-20 DIAGNOSIS — K219 Gastro-esophageal reflux disease without esophagitis: Secondary | ICD-10-CM | POA: Diagnosis not present

## 2021-03-20 DIAGNOSIS — F17210 Nicotine dependence, cigarettes, uncomplicated: Secondary | ICD-10-CM | POA: Diagnosis not present

## 2021-03-20 DIAGNOSIS — M129 Arthropathy, unspecified: Secondary | ICD-10-CM | POA: Diagnosis not present

## 2021-03-20 DIAGNOSIS — K2270 Barrett's esophagus without dysplasia: Secondary | ICD-10-CM | POA: Diagnosis not present

## 2021-03-20 DIAGNOSIS — Z809 Family history of malignant neoplasm, unspecified: Secondary | ICD-10-CM | POA: Diagnosis not present

## 2021-03-23 ENCOUNTER — Ambulatory Visit
Admission: RE | Admit: 2021-03-23 | Discharge: 2021-03-23 | Disposition: A | Discharge status: Home / Self Care | Payer: Medicare HMO | Source: Ambulatory Visit | Attending: Radiation Oncology | Admitting: Radiation Oncology

## 2021-03-23 DIAGNOSIS — J449 Chronic obstructive pulmonary disease, unspecified: Secondary | ICD-10-CM | POA: Diagnosis not present

## 2021-03-23 DIAGNOSIS — K2270 Barrett's esophagus without dysplasia: Secondary | ICD-10-CM | POA: Diagnosis not present

## 2021-03-23 DIAGNOSIS — Z809 Family history of malignant neoplasm, unspecified: Secondary | ICD-10-CM | POA: Diagnosis not present

## 2021-03-23 DIAGNOSIS — Z79899 Other long term (current) drug therapy: Secondary | ICD-10-CM | POA: Diagnosis not present

## 2021-03-23 DIAGNOSIS — K219 Gastro-esophageal reflux disease without esophagitis: Secondary | ICD-10-CM | POA: Diagnosis not present

## 2021-03-23 DIAGNOSIS — M129 Arthropathy, unspecified: Secondary | ICD-10-CM | POA: Diagnosis not present

## 2021-03-23 DIAGNOSIS — Z170 Estrogen receptor positive status [ER+]: Secondary | ICD-10-CM | POA: Diagnosis not present

## 2021-03-23 DIAGNOSIS — F17210 Nicotine dependence, cigarettes, uncomplicated: Secondary | ICD-10-CM | POA: Diagnosis not present

## 2021-03-23 DIAGNOSIS — C50812 Malignant neoplasm of overlapping sites of left female breast: Secondary | ICD-10-CM | POA: Diagnosis not present

## 2021-03-24 ENCOUNTER — Ambulatory Visit
Admission: RE | Admit: 2021-03-24 | Discharge: 2021-03-24 | Disposition: A | Discharge status: Home / Self Care | Payer: Medicare HMO | Source: Ambulatory Visit | Attending: Radiation Oncology | Admitting: Radiation Oncology

## 2021-03-24 DIAGNOSIS — Z170 Estrogen receptor positive status [ER+]: Secondary | ICD-10-CM | POA: Diagnosis not present

## 2021-03-24 DIAGNOSIS — C50812 Malignant neoplasm of overlapping sites of left female breast: Secondary | ICD-10-CM | POA: Diagnosis not present

## 2021-03-24 DIAGNOSIS — K2270 Barrett's esophagus without dysplasia: Secondary | ICD-10-CM | POA: Diagnosis not present

## 2021-03-24 DIAGNOSIS — Z79899 Other long term (current) drug therapy: Secondary | ICD-10-CM | POA: Diagnosis not present

## 2021-03-24 DIAGNOSIS — M129 Arthropathy, unspecified: Secondary | ICD-10-CM | POA: Diagnosis not present

## 2021-03-24 DIAGNOSIS — K219 Gastro-esophageal reflux disease without esophagitis: Secondary | ICD-10-CM | POA: Diagnosis not present

## 2021-03-24 DIAGNOSIS — F17210 Nicotine dependence, cigarettes, uncomplicated: Secondary | ICD-10-CM | POA: Diagnosis not present

## 2021-03-24 DIAGNOSIS — J449 Chronic obstructive pulmonary disease, unspecified: Secondary | ICD-10-CM | POA: Diagnosis not present

## 2021-03-24 DIAGNOSIS — Z809 Family history of malignant neoplasm, unspecified: Secondary | ICD-10-CM | POA: Diagnosis not present

## 2021-03-25 ENCOUNTER — Ambulatory Visit
Admission: RE | Admit: 2021-03-25 | Discharge: 2021-03-25 | Disposition: A | Discharge status: Home / Self Care | Payer: Medicare HMO | Source: Ambulatory Visit | Attending: Radiation Oncology | Admitting: Radiation Oncology

## 2021-03-25 ENCOUNTER — Ambulatory Visit: Payer: Medicare HMO

## 2021-03-25 DIAGNOSIS — K2270 Barrett's esophagus without dysplasia: Secondary | ICD-10-CM | POA: Diagnosis not present

## 2021-03-25 DIAGNOSIS — F17210 Nicotine dependence, cigarettes, uncomplicated: Secondary | ICD-10-CM | POA: Diagnosis not present

## 2021-03-25 DIAGNOSIS — Z809 Family history of malignant neoplasm, unspecified: Secondary | ICD-10-CM | POA: Diagnosis not present

## 2021-03-25 DIAGNOSIS — K219 Gastro-esophageal reflux disease without esophagitis: Secondary | ICD-10-CM | POA: Diagnosis not present

## 2021-03-25 DIAGNOSIS — Z79899 Other long term (current) drug therapy: Secondary | ICD-10-CM | POA: Diagnosis not present

## 2021-03-25 DIAGNOSIS — Z170 Estrogen receptor positive status [ER+]: Secondary | ICD-10-CM | POA: Diagnosis not present

## 2021-03-25 DIAGNOSIS — M129 Arthropathy, unspecified: Secondary | ICD-10-CM | POA: Diagnosis not present

## 2021-03-25 DIAGNOSIS — J449 Chronic obstructive pulmonary disease, unspecified: Secondary | ICD-10-CM | POA: Diagnosis not present

## 2021-03-25 DIAGNOSIS — C50812 Malignant neoplasm of overlapping sites of left female breast: Secondary | ICD-10-CM | POA: Diagnosis not present

## 2021-03-26 ENCOUNTER — Ambulatory Visit: Payer: Medicare HMO

## 2021-03-26 ENCOUNTER — Ambulatory Visit
Admission: RE | Admit: 2021-03-26 | Discharge: 2021-03-26 | Disposition: A | Discharge status: Home / Self Care | Payer: Medicare HMO | Source: Ambulatory Visit | Attending: Radiation Oncology | Admitting: Radiation Oncology

## 2021-03-26 DIAGNOSIS — K219 Gastro-esophageal reflux disease without esophagitis: Secondary | ICD-10-CM | POA: Diagnosis not present

## 2021-03-26 DIAGNOSIS — Z809 Family history of malignant neoplasm, unspecified: Secondary | ICD-10-CM | POA: Diagnosis not present

## 2021-03-26 DIAGNOSIS — F17210 Nicotine dependence, cigarettes, uncomplicated: Secondary | ICD-10-CM | POA: Diagnosis not present

## 2021-03-26 DIAGNOSIS — Z79899 Other long term (current) drug therapy: Secondary | ICD-10-CM | POA: Diagnosis not present

## 2021-03-26 DIAGNOSIS — J449 Chronic obstructive pulmonary disease, unspecified: Secondary | ICD-10-CM | POA: Diagnosis not present

## 2021-03-26 DIAGNOSIS — M129 Arthropathy, unspecified: Secondary | ICD-10-CM | POA: Diagnosis not present

## 2021-03-26 DIAGNOSIS — Z170 Estrogen receptor positive status [ER+]: Secondary | ICD-10-CM | POA: Diagnosis not present

## 2021-03-26 DIAGNOSIS — K2270 Barrett's esophagus without dysplasia: Secondary | ICD-10-CM | POA: Diagnosis not present

## 2021-03-26 DIAGNOSIS — C50812 Malignant neoplasm of overlapping sites of left female breast: Secondary | ICD-10-CM | POA: Diagnosis not present

## 2021-03-27 ENCOUNTER — Ambulatory Visit
Admission: RE | Admit: 2021-03-27 | Discharge: 2021-03-27 | Disposition: A | Discharge status: Home / Self Care | Payer: Medicare HMO | Source: Ambulatory Visit | Attending: Radiation Oncology | Admitting: Radiation Oncology

## 2021-03-27 DIAGNOSIS — Z79899 Other long term (current) drug therapy: Secondary | ICD-10-CM | POA: Diagnosis not present

## 2021-03-27 DIAGNOSIS — K2270 Barrett's esophagus without dysplasia: Secondary | ICD-10-CM | POA: Diagnosis not present

## 2021-03-27 DIAGNOSIS — C50812 Malignant neoplasm of overlapping sites of left female breast: Secondary | ICD-10-CM | POA: Diagnosis not present

## 2021-03-27 DIAGNOSIS — F17210 Nicotine dependence, cigarettes, uncomplicated: Secondary | ICD-10-CM | POA: Diagnosis not present

## 2021-03-27 DIAGNOSIS — K219 Gastro-esophageal reflux disease without esophagitis: Secondary | ICD-10-CM | POA: Diagnosis not present

## 2021-03-27 DIAGNOSIS — Z809 Family history of malignant neoplasm, unspecified: Secondary | ICD-10-CM | POA: Diagnosis not present

## 2021-03-27 DIAGNOSIS — M129 Arthropathy, unspecified: Secondary | ICD-10-CM | POA: Diagnosis not present

## 2021-03-27 DIAGNOSIS — J449 Chronic obstructive pulmonary disease, unspecified: Secondary | ICD-10-CM | POA: Diagnosis not present

## 2021-03-27 DIAGNOSIS — Z170 Estrogen receptor positive status [ER+]: Secondary | ICD-10-CM | POA: Diagnosis not present

## 2021-03-31 ENCOUNTER — Ambulatory Visit
Admission: RE | Admit: 2021-03-31 | Discharge: 2021-03-31 | Disposition: A | Discharge status: Home / Self Care | Payer: Medicare HMO | Source: Ambulatory Visit | Attending: Radiation Oncology | Admitting: Radiation Oncology

## 2021-03-31 ENCOUNTER — Other Ambulatory Visit: Payer: Self-pay | Admitting: Licensed Clinical Social Worker

## 2021-03-31 DIAGNOSIS — C50812 Malignant neoplasm of overlapping sites of left female breast: Secondary | ICD-10-CM | POA: Diagnosis not present

## 2021-03-31 DIAGNOSIS — J449 Chronic obstructive pulmonary disease, unspecified: Secondary | ICD-10-CM | POA: Diagnosis not present

## 2021-03-31 DIAGNOSIS — F17210 Nicotine dependence, cigarettes, uncomplicated: Secondary | ICD-10-CM | POA: Diagnosis not present

## 2021-03-31 DIAGNOSIS — K2270 Barrett's esophagus without dysplasia: Secondary | ICD-10-CM | POA: Diagnosis not present

## 2021-03-31 DIAGNOSIS — Z170 Estrogen receptor positive status [ER+]: Secondary | ICD-10-CM

## 2021-03-31 DIAGNOSIS — Z79899 Other long term (current) drug therapy: Secondary | ICD-10-CM | POA: Diagnosis not present

## 2021-03-31 DIAGNOSIS — Z809 Family history of malignant neoplasm, unspecified: Secondary | ICD-10-CM | POA: Diagnosis not present

## 2021-03-31 DIAGNOSIS — K219 Gastro-esophageal reflux disease without esophagitis: Secondary | ICD-10-CM | POA: Diagnosis not present

## 2021-03-31 DIAGNOSIS — M129 Arthropathy, unspecified: Secondary | ICD-10-CM | POA: Diagnosis not present

## 2021-03-31 MED ORDER — SILVER SULFADIAZINE 1 % EX CREA: 1.0000 "application " | TOPICAL_CREAM | Freq: Every day | CUTANEOUS | 2 refills | Status: DC

## 2021-04-01 ENCOUNTER — Ambulatory Visit: Payer: Medicare HMO

## 2021-04-01 ENCOUNTER — Ambulatory Visit
Admission: RE | Admit: 2021-04-01 | Discharge: 2021-04-01 | Disposition: A | Discharge status: Home / Self Care | Payer: Medicare HMO | Source: Ambulatory Visit | Attending: Radiation Oncology | Admitting: Radiation Oncology

## 2021-04-01 DIAGNOSIS — J449 Chronic obstructive pulmonary disease, unspecified: Secondary | ICD-10-CM | POA: Diagnosis not present

## 2021-04-01 DIAGNOSIS — Z79899 Other long term (current) drug therapy: Secondary | ICD-10-CM | POA: Insufficient documentation

## 2021-04-01 DIAGNOSIS — K2270 Barrett's esophagus without dysplasia: Secondary | ICD-10-CM | POA: Diagnosis not present

## 2021-04-01 DIAGNOSIS — K219 Gastro-esophageal reflux disease without esophagitis: Secondary | ICD-10-CM | POA: Diagnosis not present

## 2021-04-01 DIAGNOSIS — M129 Arthropathy, unspecified: Secondary | ICD-10-CM | POA: Diagnosis not present

## 2021-04-01 DIAGNOSIS — F17210 Nicotine dependence, cigarettes, uncomplicated: Secondary | ICD-10-CM | POA: Diagnosis not present

## 2021-04-01 DIAGNOSIS — Z170 Estrogen receptor positive status [ER+]: Secondary | ICD-10-CM | POA: Insufficient documentation

## 2021-04-01 DIAGNOSIS — C50812 Malignant neoplasm of overlapping sites of left female breast: Secondary | ICD-10-CM | POA: Insufficient documentation

## 2021-04-01 DIAGNOSIS — Z809 Family history of malignant neoplasm, unspecified: Secondary | ICD-10-CM | POA: Diagnosis not present

## 2021-04-02 ENCOUNTER — Ambulatory Visit
Admission: RE | Admit: 2021-04-02 | Discharge: 2021-04-02 | Disposition: A | Discharge status: Home / Self Care | Payer: Medicare HMO | Source: Ambulatory Visit | Attending: Radiation Oncology | Admitting: Radiation Oncology

## 2021-04-02 DIAGNOSIS — C50812 Malignant neoplasm of overlapping sites of left female breast: Secondary | ICD-10-CM | POA: Diagnosis not present

## 2021-04-02 DIAGNOSIS — K2270 Barrett's esophagus without dysplasia: Secondary | ICD-10-CM | POA: Diagnosis not present

## 2021-04-02 DIAGNOSIS — Z170 Estrogen receptor positive status [ER+]: Secondary | ICD-10-CM | POA: Diagnosis not present

## 2021-04-02 DIAGNOSIS — K219 Gastro-esophageal reflux disease without esophagitis: Secondary | ICD-10-CM | POA: Diagnosis not present

## 2021-04-02 DIAGNOSIS — Z79899 Other long term (current) drug therapy: Secondary | ICD-10-CM | POA: Diagnosis not present

## 2021-04-02 DIAGNOSIS — F17210 Nicotine dependence, cigarettes, uncomplicated: Secondary | ICD-10-CM | POA: Diagnosis not present

## 2021-04-02 DIAGNOSIS — Z809 Family history of malignant neoplasm, unspecified: Secondary | ICD-10-CM | POA: Diagnosis not present

## 2021-04-02 DIAGNOSIS — J449 Chronic obstructive pulmonary disease, unspecified: Secondary | ICD-10-CM | POA: Diagnosis not present

## 2021-04-02 DIAGNOSIS — M129 Arthropathy, unspecified: Secondary | ICD-10-CM | POA: Diagnosis not present

## 2021-04-14 ENCOUNTER — Other Ambulatory Visit: Payer: Self-pay

## 2021-04-14 ENCOUNTER — Inpatient Hospital Stay: Payer: Medicare HMO | Attending: Internal Medicine | Admitting: Internal Medicine

## 2021-04-14 ENCOUNTER — Encounter: Payer: Self-pay | Admitting: Internal Medicine

## 2021-04-14 DIAGNOSIS — D751 Secondary polycythemia: Secondary | ICD-10-CM | POA: Diagnosis not present

## 2021-04-14 DIAGNOSIS — Z170 Estrogen receptor positive status [ER+]: Secondary | ICD-10-CM | POA: Diagnosis not present

## 2021-04-14 DIAGNOSIS — C50812 Malignant neoplasm of overlapping sites of left female breast: Secondary | ICD-10-CM

## 2021-04-14 DIAGNOSIS — Z79899 Other long term (current) drug therapy: Secondary | ICD-10-CM | POA: Insufficient documentation

## 2021-04-14 DIAGNOSIS — Z923 Personal history of irradiation: Secondary | ICD-10-CM | POA: Diagnosis not present

## 2021-04-14 MED ORDER — ANASTROZOLE 1 MG PO TABS: 1.0000 mg | ORAL_TABLET | Freq: Every day | ORAL | 1 refills | Status: DC

## 2021-04-14 NOTE — Progress Notes (Signed)
one Morgan Valdez NOTE  Patient Care Team: Tracie Harrier, MD as PCP - General (Internal Medicine) Theodore Demark, RN as Oncology Nurse Navigator  CHIEF COMPLAINTS/PURPOSE OF CONSULTATION: Breast cancer  #  Oncology History Overview Note  #  BREAST, LEFT 2:00; ULTRASOUND-GUIDED BIOPSY:  - INVASIVE MAMMARY CARCINOMA WITH FEATURES SUGGESTIVE OF SOLID  PSEUDOPAPILLARY CARCINOMA; ER >90%; PR > 90%; he 2 Neu-NEG [Dr.Cinton]  PATHOLOGIC STAGE CLASSIFICATION (pTNM, AJCC 8th Edition):  TNM Descriptors: Not applicable  [id number]  Regional Lymph Nodes Modifier: sn  pN0  pM - Not applicable   SPECIAL STUDIES  Breast Biomarker Testing Performed on Previous Biopsy: [id number]  Estrogen Receptor (ER) Status: POSITIVE          Percentage of cells with nuclear positivity: Greater than 90%          Average intensity of staining: Strong   Progesterone Receptor (PgR) Status: POSITIVE          Percentage of cells with nuclear positivity: Greater than 90%          Average intensity of staining: Moderate   HER2 (by immunohistochemistry): NEGATIVE (Score 1+)  #Cancer screening : colonoscopy [since 56 years; again aug 2022];smoker;        Carcinoma of overlapping sites of left breast in female, estrogen receptor positive ([place])  01/19/2021 Initial Diagnosis   Carcinoma of overlapping sites of left breast in female, estrogen receptor positive ([place])   01/19/2021 Cancer Staging   Staging form: Breast, AJCC 8th Edition - Clinical: Stage IA (cT1a, cN0, cM0, G2, ER+, PR+, HER2-) - Signed by Cammie Sickle, MD on 01/19/2021  Histologic grading system: 3 grade system     Genetic Testing   Negative genetic testing. No pathogenic variants identified on the [facility] Multi-Cancer Panel+RNA. The report date is 03/18/2021.  The Multi-Cancer Panel + RNA offered by [facility] includes sequencing and/or deletion duplication testing of the following 84 genes: AIP, ALK, APC, ATM, AXIN2,BAP1,   BARD1, BLM, BMPR1A, BRCA1, BRCA2, BRIP1, CASR, CDC73, CDH1, CDK4, CDKN1B, CDKN1C, CDKN2A (p14ARF), CDKN2A (p16INK4a), CEBPA, CHEK2, CTNNA1, DICER1, DIS3L2, EGFR (c.2369C>T, p.Thr790Met variant only), EPCAM (Deletion/duplication testing only), FH, FLCN, GATA2, GPC3, GREM1 (Promoter region deletion/duplication testing only), HOXB13 (c.251G>A, p.Gly84Glu), HRAS, KIT, MAX, MEN1, MET, MITF (c.952G>A, p.Glu318Lys variant only), MLH1, MSH2, MSH3, MSH6, MUTYH, NBN, NF1, NF2, NTHL1, PALB2, PDGFRA, PHOX2B, PMS2, POLD1, POLE, POT1, PRKAR1A, PTCH1, PTEN, RAD50, RAD51C, RAD51D, RB1, RECQL4, RET, RUNX1, SDHAF2, SDHA (sequence changes only), SDHB, SDHC, SDHD, SMAD4, SMARCA4, SMARCB1, SMARCE1, STK11, SUFU, TERC, TERT, TMEM127, TP53, TSC1, TSC2, VHL, WRN and WT1.      HISTORY OF PRESENTING ILLNESS:  Morgan Valdez 75 y.o.  female stage I breast cancer ER/PR positive HER2 negative status postlumpectomy followed by radiation is here for follow-up.    Patient finished radiation the first week of June.  Complains of radiation induced rash-currently using Aquaphor/Silvadene.  Denies any shortness of breath or cough he denies any chest pain.  Review of Systems  Constitutional:  Negative for chills, diaphoresis, fever, malaise/fatigue and weight loss.  HENT:  Negative for nosebleeds and sore throat.   Eyes:  Negative for double vision.  Respiratory:  Negative for cough, hemoptysis, sputum production, shortness of breath and wheezing.   Cardiovascular:  Negative for chest pain, palpitations, orthopnea and leg swelling.  Gastrointestinal:  Negative for abdominal pain, blood in stool, constipation, diarrhea, heartburn, melena, nausea and vomiting.  Genitourinary:  Negative for dysuria, frequency and urgency.  Musculoskeletal:  Positive for back  pain. Negative for joint pain.  Skin:  Positive for rash. Negative for itching.  Neurological:  Negative for dizziness, tingling, focal weakness, weakness and headaches.   Endo/Heme/Allergies:  Does not bruise/bleed easily.  Psychiatric/Behavioral:  Negative for depression. The patient is not nervous/anxious and does not have insomnia.     MEDICAL HISTORY:  Past Medical History:  Diagnosis Date  . Allergic state   . Arthritis   . Barrett's esophagus   . Chronic cough   . COPD (chronic obstructive pulmonary disease) ([place])   . Depression   . Duodenitis   . Esophageal motility disorder   . Family history of adrenal cancer   . Family history of brain cancer   . Family history of colon cancer   . Family history of lung cancer   . Family history of melanoma   . Gastritis   . GERD (gastroesophageal reflux disease)   . Headache    migraines - 3-4x/yr  . Pre-diabetes   . Tobacco abuse disorder   . Varicose veins of legs     SURGICAL HISTORY: Past Surgical History:  Procedure Laterality Date  . APPENDECTOMY    . BREAST BIOPSY Left 01/02/2021   [facility] WITH FEATURES SUGGESTIVE OF SOLID PSEUDOPAPILLARY CARCINOMA  . BREAST LUMPECTOMY,RADIO FREQ LOCALIZER,AXILLARY SENTINEL LYMPH NODE BIOPSY Left 01/28/2021   Procedure: BREAST LUMPECTOMY,RADIO FREQ LOCALIZER,AXILLARY SENTINEL LYMPH NODE BIOPSY;  Surgeon: Herbert Pun, MD;  Location: [facility];  Service: General;  Laterality: Left;  . CATARACT EXTRACTION W/PHACO Right 01/03/2017   Procedure: CATARACT EXTRACTION PHACO AND INTRAOCULAR LENS PLACEMENT ([place])  Right;  Surgeon: Ronnell Freshwater, MD;  Location: [facility];  Service: Ophthalmology;  Laterality: Right;  . CATARACT EXTRACTION W/PHACO Left 01/17/2017   Procedure: CATARACT EXTRACTION PHACO AND INTRAOCULAR LENS PLACEMENT ([facility]);  Surgeon: Ronnell Freshwater, MD;  Location: [facility];  Service: Ophthalmology;  Laterality: Left;  left  . CHOLECYSTECTOMY    . COLONOSCOPY WITH PROPOFOL N/A 04/06/2018   Procedure: COLONOSCOPY WITH PROPOFOL;  Surgeon: Lollie Sails, MD;  Location: [facility] ENDOSCOPY;  Service: Endoscopy;   Laterality: N/A;  . ESOPHAGOGASTRODUODENOSCOPY N/A 09/26/2018   Procedure: ESOPHAGOGASTRODUODENOSCOPY (EGD);  Surgeon: Lollie Sails, MD;  Location: [facility] ENDOSCOPY;  Service: Endoscopy;  Laterality: N/A;  . ESOPHAGOGASTRODUODENOSCOPY (EGD) WITH PROPOFOL N/A 10/14/2015   Procedure: ESOPHAGOGASTRODUODENOSCOPY (EGD) WITH PROPOFOL;  Surgeon: Lollie Sails, MD;  Location: [facility] ENDOSCOPY;  Service: Endoscopy;  Laterality: N/A;  . EYE SURGERY Bilateral 2019   cataracts   . HERNIA REPAIR      SOCIAL HISTORY: Social History   Socioeconomic History  . Marital status: Widowed    Spouse name: Not on file  . Number of children: Not on file  . Years of education: Not on file  . Highest education level: Not on file  Occupational History  . Not on file  Tobacco Use  . Smoking status: Every Day    Packs/day: 0.50    Years: 50.00    Pack years: 25.00    Types: Cigarettes  . Smokeless tobacco: Never  Vaping Use  . Vaping Use: Never used  Substance and Sexual Activity  . Alcohol use: Yes    Alcohol/week: 1.0 standard drink    Types: 1 Glasses of wine per week    Comment: rare - Holidays  . Drug use: No  . Sexual activity: Not on file  Other Topics Concern  . 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 Cancer Mother        esophagus, spine. adrenal gland; lung.   . Dementia Father   . Cancer Sister        colon cancer- 60s to liver.   . Cancer Maternal Grandmother        brain cancer  . Diabetes Maternal Grandfather   . Stroke Maternal Grandfather   .  Rectal cancer Daughter        at 13 years; survived.   . Melanoma Grandson        dx 22 on neck  . Breast cancer Neg Hx     ALLERGIES:  is allergic to tape, bacitracin, and other.  MEDICATIONS:  Current Outpatient Medications  Medication Sig Dispense Refill  . ALPRAZolam (XANAX) 0.25 MG tablet Take 0.25 mg by mouth 2 (two) times daily as needed for anxiety.    Marland Kitchen anastrozole (ARIMIDEX) 1 MG tablet Take 1 tablet (1 mg total) by mouth daily. 90 tablet 1  . buPROPion (WELLBUTRIN SR) 150 MG 12 hr tablet Take 150 mg by mouth daily.    . cetirizine (ZYRTEC) 10 MG tablet Take 10 mg by mouth daily as needed for allergies.    . clotrimazole-betamethasone (LOTRISONE) cream Apply 1 application topically 2 (two) times daily.    . fluticasone (FLONASE) 50 MCG/ACT nasal spray Place 2 sprays into both nostrils daily as needed for allergies.    Marland Kitchen gabapentin (NEURONTIN) 100 MG capsule Take 100 mg by mouth 3 (three) times daily. Two capsules 3 times a day as needed    . lidocaine-prilocaine (EMLA) cream Apply 1 application topically as needed.    Marland Kitchen omeprazole (PRILOSEC) 40 MG capsule Take 40 mg by mouth in the morning and at bedtime.    . ondansetron (ZOFRAN) 4 MG tablet Take 4 mg by mouth 2 (two) times daily as needed for nausea/vomiting.  0  . peppermint oil liquid Take 1 capsule by mouth in the morning, at noon, and at bedtime. PEPPERMINT OIL WITH GINGER & FENNEL    . Polyethyl Glycol-Propyl Glycol (SYSTANE) 0.4-0.3 % SOLN Place 1-2 drops into both eyes 3 (three) times daily as needed (dry/irritated eyes).    . SPIRIVA HANDIHALER 18 MCG inhalation capsule Place 1 capsule into inhaler and inhale in the morning.    . furosemide (LASIX) 20 MG tablet Take 20 mg by mouth as needed for fluid.  (Patient not taking: No sig reported)    . senna-docusate (SENOKOT-S) 8.6-50 MG tablet Take 1 tablet by mouth daily. (Patient not taking: No sig reported) 30 tablet 0  . sertraline (ZOLOFT) 50 MG tablet Take 50 mg by  mouth daily. (Patient not taking: Reported on 04/14/2021)    . silver sulfADIAZINE (SILVADENE) 1 % cream Apply 1 application topically daily. (Patient not taking: Reported on 04/14/2021) 125 each 2   No current facility-administered medications for this visit.      Marland Kitchen  PHYSICAL EXAMINATION: ECOG PERFORMANCE STATUS: 0 - Asymptomatic  Vitals:   04/14/21 1052  BP: 132/72  Pulse: 92  Resp: 16  Temp: 98.3  F (36.8 C)  SpO2: 97%   Filed Weights   04/14/21 1052  Weight: 148 lb (67.1 kg)    Physical Exam Constitutional:      Comments: Ambulating independently.  Accompanied by family.  HENT:     Head: Normocephalic and atraumatic.     Mouth/Throat:     Pharynx: No oropharyngeal exudate.  Eyes:     Pupils: Pupils are equal, round, and reactive to light.  Cardiovascular:     Rate and Rhythm: Normal rate and regular rhythm.  Pulmonary:     Effort: Pulmonary effort is normal. No respiratory distress.     Breath sounds: Normal breath sounds. No wheezing.  Abdominal:     General: Bowel sounds are normal. There is no distension.     Palpations: Abdomen is soft. There is no mass.     Tenderness: There is no abdominal tenderness. There is no guarding or rebound.  Musculoskeletal:        General: No tenderness. Normal range of motion.     Cervical back: Normal range of motion and neck supple.  Skin:    General: Skin is warm.     Comments: Mild erythema in the radiation portal no desquamation.   Inframammary rash bilaterally-suggestive of fungal dermatitis.   Neurological:     Mental Status: She is alert and oriented to person, place, and time.  Psychiatric:        Mood and Affect: Affect normal.     LABORATORY DATA:  I have reviewed the data as listed Lab Results  Component Value Date   WBC 7.5 03/18/2021   HGB 15.9 (H) 03/18/2021   HCT 47.7 (H) 03/18/2021   MCV 93.0 03/18/2021   PLT 236 03/18/2021   Recent Labs    01/19/21 1153  NA 137  K 4.4  CL 99  CO2 27   GLUCOSE 97  BUN 17  CREATININE 0.97  CALCIUM 9.1  GFRNONAA >60  PROT 7.2  ALBUMIN 4.1  AST 13*  ALT 11  ALKPHOS 59  BILITOT 0.6    RADIOGRAPHIC STUDIES: I have personally reviewed the radiological images as listed and agreed with the findings in the report. DG Bone Density  Result Date: 04/22/2021 EXAM: DUAL X-RAY ABSORPTIOMETRY (DXA) FOR BONE MINERAL DENSITY IMPRESSION: Your patient Morgan Valdez completed a BMD test on 04/22/2021 using the [place] (software version: 14.10) manufactured by [employer]. The following summarizes the results of our evaluation. Technologist: [facility] PATIENT BIOGRAPHICAL: Name: Morgan Valdez, Morgan Valdez Patient ID: [id number] Birth Date: 11-26-1945 Height: 65.5 in. Gender: Female Exam Date: 04/22/2021 Weight: 147.9 lbs. Indications: Advanced Age, COPD, History of Breast Cancer, History of Radiation, Postmenopausal Fractures: Left elbow Treatments: Anastrozole DENSITOMETRY RESULTS: Site      Region    Measured Date Measured Age WHO Classification Young Adult T-score BMD         %Change vs. Previous Significant Change (*) AP Spine L1-L2 04/22/20[age] Osteopenia -1.3 1.016 g/cm2 DualFemur Neck Left 04/22/2022 73.9 Osteoporosis -2.6 0.679 g/cm2 ASSESSMENT: The BMD measured at Femur Neck Left is 0.679 g/cm2 with a T-score of -2.6. This patient is considered osteoporotic according to [facility]) criteria. The scan quality is good. L-3 & 4 was excluded due to degenerative changes. World Health Organization [facility]) criteria for post-menopausal, Caucasian Women: Normal:                   T-score at or above -1 SD Osteopenia/low bone mass: T-score  between -1 and -2.5 SD Osteoporosis:             T-score at or below -2.5 SD RECOMMENDATIONS: 1. All patients should optimize calcium and vitamin D intake. 2. Consider FDA-approved medical therapies in postmenopausal women and men aged 71 years and older, based on the following: a. A hip or  vertebral(clinical or morphometric) fracture b. T-score < -2.5 at the femoral neck or spine after appropriate evaluation to exclude secondary causes c. Low bone mass (T-score between -1.0 and -2.5 at the femoral neck or spine) and a 10-year probability of a hip fracture > 3% or a 10-year probability of a major osteoporosis-related fracture > 20% based on the [place]-adapted WHO algorithm 3. Clinician judgment and/or patient preferences may indicate treatment for people with 10-year fracture probabilities above or below these levels FOLLOW-UP: People with diagnosed cases of osteoporosis or at high risk for fracture should have regular bone mineral density tests. For patients eligible for Medicare, routine testing is allowed once every 2 years. The testing frequency can be increased to one year for patients who have rapidly progressing disease, those who are receiving or discontinuing medical therapy to restore bone mass, or have additional risk factors. I have reviewed this report, and agree with the above findings. Mark A. Thornton Papas, M.D. [facility] Electronically Signed   By: Lavonia Dana M.D.   On: 04/22/2021 16:16     ASSESSMENT & PLAN:   Carcinoma of overlapping sites of left breast in female, estrogen receptor positive ([place]) #  stage I ER/PR positive HER-2 negative left breast cancer-invasive mammary carcinoma. S/p RT [finished June 1st week, 2022].  Proceed with adjuvant aromatase inhibitor/anastrozole.  #Discussed the mechanism of action of aromatase inhibitors-with blocking of estrogen to prevent breast cancer.  Also discussed the potential side effects including but not limited to arthralgias hot flashes and increased risk of osteoporosis.  Discussed surveillance osteoporosis/bone density prior to next visit.   # Radiation dermatitis- G2- improved; continue Aquaphor/Silvadene ointment.  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  Monitor for now.   #S/p genetic evaluation-negative  for any genetic predisposition.  Discussed with the patient.  # Smoking: Active smoker; Discussed regarding lung cancer screening program at length; which includes low-dose CT scan on annual basis for 5 years.  Based upon the findings patient would be recommended surveillance/biopsies/or surgery.  Lung cancer program has shown to save lives by early detection of lung cancer.  Will inform Shawn.    # DISPOSITION:  # follow up in 6 weeks- MD; BMD- prior- Dr. B  All questions were answered. The patient/family knows to call the clinic with any problems, questions or concerns.    Cammie Sickle, MD 04/26/2021 8:58 PM

## 2021-04-14 NOTE — Patient Instructions (Signed)
#   Clotrimazole or lotrimin ointment twice a a day under the breast rash

## 2021-04-14 NOTE — Progress Notes (Signed)
Has noticed some constipation within the last couple of weeks. Has been having to take miralax daily. It helps some but still not able to go a lot.

## 2021-04-14 NOTE — Assessment & Plan Note (Addendum)
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 S/p RT [finished June 1st week, 2022].  Proceed with adjuvant aromatase inhibitor/anastrozole.  #Discussed the mechanism of action of aromatase inhibitors-with blocking of estrogen to prevent breast cancer.  Also discussed the potential side effects including but not limited to arthralgias hot flashes and increased risk of osteoporosis.  Discussed surveillance osteoporosis/bone density prior to next visit.   # Radiation dermatitis- G2- improved; continue Aquaphor/Silvadene ointment.  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  Monitor for now.   #S/p genetic evaluation-negative for any genetic predisposition.  Discussed with the patient.  # Smoking: Active smoker; Discussed regarding lung cancer screening program at length; which includes low-dose CT scan on annual basis for 5 years.  Based upon the findings patient would be recommended surveillance/biopsies/or surgery.  Lung cancer program has shown to save lives by early detection of lung cancer.  Will inform Shawn.    # DISPOSITION:  # follow up in 6 weeks- MD; BMD- prior- Dr. Jacinto Reap

## 2021-04-22 ENCOUNTER — Other Ambulatory Visit: Payer: Self-pay

## 2021-04-22 ENCOUNTER — Ambulatory Visit
Admission: RE | Admit: 2021-04-22 | Discharge: 2021-04-22 | Disposition: A | Discharge status: Home / Self Care | Payer: Medicare HMO | Source: Ambulatory Visit | Attending: Internal Medicine | Admitting: Internal Medicine

## 2021-04-22 DIAGNOSIS — C50812 Malignant neoplasm of overlapping sites of left female breast: Secondary | ICD-10-CM | POA: Insufficient documentation

## 2021-04-22 DIAGNOSIS — Z170 Estrogen receptor positive status [ER+]: Secondary | ICD-10-CM | POA: Diagnosis not present

## 2021-04-22 DIAGNOSIS — M810 Age-related osteoporosis without current pathological fracture: Secondary | ICD-10-CM | POA: Diagnosis not present

## 2021-04-22 DIAGNOSIS — Z780 Asymptomatic menopausal state: Secondary | ICD-10-CM | POA: Diagnosis not present

## 2021-04-22 DIAGNOSIS — Z923 Personal history of irradiation: Secondary | ICD-10-CM | POA: Insufficient documentation

## 2021-04-22 DIAGNOSIS — Z13820 Encounter for screening for osteoporosis: Secondary | ICD-10-CM | POA: Insufficient documentation

## 2021-04-22 DIAGNOSIS — M8588 Other specified disorders of bone density and structure, other site: Secondary | ICD-10-CM | POA: Diagnosis not present

## 2021-04-30 DIAGNOSIS — F17210 Nicotine dependence, cigarettes, uncomplicated: Secondary | ICD-10-CM | POA: Diagnosis not present

## 2021-04-30 DIAGNOSIS — Z170 Estrogen receptor positive status [ER+]: Secondary | ICD-10-CM | POA: Diagnosis not present

## 2021-04-30 DIAGNOSIS — J449 Chronic obstructive pulmonary disease, unspecified: Secondary | ICD-10-CM | POA: Diagnosis not present

## 2021-04-30 DIAGNOSIS — F329 Major depressive disorder, single episode, unspecified: Secondary | ICD-10-CM | POA: Diagnosis not present

## 2021-04-30 DIAGNOSIS — L298 Other pruritus: Secondary | ICD-10-CM | POA: Diagnosis not present

## 2021-04-30 DIAGNOSIS — C50912 Malignant neoplasm of unspecified site of left female breast: Secondary | ICD-10-CM | POA: Diagnosis not present

## 2021-04-30 DIAGNOSIS — G2581 Restless legs syndrome: Secondary | ICD-10-CM | POA: Diagnosis not present

## 2021-05-02 DIAGNOSIS — I10 Essential (primary) hypertension: Secondary | ICD-10-CM | POA: Diagnosis not present

## 2021-05-02 DIAGNOSIS — M810 Age-related osteoporosis without current pathological fracture: Secondary | ICD-10-CM | POA: Diagnosis not present

## 2021-05-02 DIAGNOSIS — R0789 Other chest pain: Secondary | ICD-10-CM | POA: Diagnosis not present

## 2021-05-02 DIAGNOSIS — K219 Gastro-esophageal reflux disease without esophagitis: Secondary | ICD-10-CM | POA: Diagnosis not present

## 2021-05-02 DIAGNOSIS — F17200 Nicotine dependence, unspecified, uncomplicated: Secondary | ICD-10-CM | POA: Diagnosis not present

## 2021-05-02 DIAGNOSIS — R1013 Epigastric pain: Secondary | ICD-10-CM | POA: Diagnosis not present

## 2021-05-02 DIAGNOSIS — J449 Chronic obstructive pulmonary disease, unspecified: Secondary | ICD-10-CM | POA: Diagnosis not present

## 2021-05-07 ENCOUNTER — Ambulatory Visit
Admission: RE | Admit: 2021-05-07 | Discharge: 2021-05-07 | Disposition: A | Discharge status: Home / Self Care | Payer: Medicare HMO | Source: Ambulatory Visit | Attending: Radiation Oncology | Admitting: Radiation Oncology

## 2021-05-07 ENCOUNTER — Encounter: Payer: Self-pay | Admitting: Radiation Oncology

## 2021-05-07 VITALS — BP 112/79 | HR 99 | Temp 98.1°F | Resp 18 | Wt 145.0 lb

## 2021-05-07 DIAGNOSIS — Z170 Estrogen receptor positive status [ER+]: Secondary | ICD-10-CM

## 2021-05-07 DIAGNOSIS — Z923 Personal history of irradiation: Secondary | ICD-10-CM | POA: Diagnosis not present

## 2021-05-07 DIAGNOSIS — Z79811 Long term (current) use of aromatase inhibitors: Secondary | ICD-10-CM | POA: Insufficient documentation

## 2021-05-07 DIAGNOSIS — C50812 Malignant neoplasm of overlapping sites of left female breast: Secondary | ICD-10-CM

## 2021-05-07 DIAGNOSIS — C50412 Malignant neoplasm of upper-outer quadrant of left female breast: Secondary | ICD-10-CM | POA: Insufficient documentation

## 2021-05-07 NOTE — Progress Notes (Signed)
Radiation Oncology Follow up Note  Name: Morgan Valdez   Date:   05/07/2021 MRN:  [id number] DOB: Aug 12, 1946    This 75 y.o. female presents to the clinic today for 1 month follow-up status post whole breast radiation to her left breast for stage Ia ER/PR positive invasive mammary carcinoma.  REFERRING PROVIDER: Tracie Harrier, MD  HPI: Patient is a 75 year old female now at 1 month having pleated whole breast radiation to her left breast for stage I ER/PR positive HER2/neu negative invasive mammary carcinoma.  Seen today in routine follow-up she is doing well.  She specifically denies breast tenderness cough or bone pain..  She has been started on Arimidex tolerating it well without side effect  COMPLICATIONS OF TREATMENT: none  FOLLOW UP COMPLIANCE: keeps appointments   PHYSICAL EXAM:  BP 112/79 (BP Location: Right Arm, Patient Position: Sitting)   Pulse 99   Temp 98.1 F (36.7 C) (Tympanic)   Resp 18   Wt 145 lb (65.8 kg)   BMI 22.71 kg/m  Lungs are clear to A&P cardiac examination essentially unremarkable with regular rate and rhythm. No dominant mass or nodularity is noted in either breast in 2 positions examined. Incision is well-healed. No axillary or supraclavicular adenopathy is appreciated. Cosmetic result is excellent.  Well-developed well-nourished patient in NAD. HEENT reveals PERLA, EOMI, discs not visualized.  Oral cavity is clear. No oral mucosal lesions are identified. Neck is clear without evidence of cervical or supraclavicular adenopathy. Lungs are clear to A&P. Cardiac examination is essentially unremarkable with regular rate and rhythm without murmur rub or thrill. Abdomen is benign with no organomegaly or masses noted. Motor sensory and DTR levels are equal and symmetric in the upper and lower extremities. Cranial nerves II through XII are grossly intact. Proprioception is intact. No peripheral adenopathy or edema is identified. No motor or sensory levels are  noted. Crude visual fields are within normal range.  RADIOLOGY RESULTS: No current films to review  PLAN: At the present time patient is doing well recovering nicely from her radiation therapy treatments and surgery.  On pleased with her overall progress.  She continues on Arimidex without side effect.  I have asked to see her back in 4 to 5 months for follow-up.  Patient knows to call with any concerns.  I would like to take this opportunity to thank you for allowing me to participate in the care of your patient.Noreene Filbert, MD

## 2021-05-07 NOTE — Progress Notes (Signed)
Survivorship Care Plan visit completed.  Treatment summary reviewed and given to patient.  ASCO answers booklet reviewed and given to patient.  CARE program and Cancer Transitions discussed with patient along with other resources cancer center offers to patients and caregivers.  Patient verbalized understanding.    

## 2021-05-18 DIAGNOSIS — F17218 Nicotine dependence, cigarettes, with other nicotine-induced disorders: Secondary | ICD-10-CM | POA: Diagnosis not present

## 2021-05-18 DIAGNOSIS — R0600 Dyspnea, unspecified: Secondary | ICD-10-CM | POA: Diagnosis not present

## 2021-05-18 DIAGNOSIS — J439 Emphysema, unspecified: Secondary | ICD-10-CM | POA: Diagnosis not present

## 2021-05-19 ENCOUNTER — Telehealth: Payer: Self-pay | Admitting: *Deleted

## 2021-05-19 NOTE — Telephone Encounter (Signed)
errror

## 2021-05-19 NOTE — Telephone Encounter (Signed)
Sent to SMS

## 2021-05-19 NOTE — Telephone Encounter (Signed)
Patient called to report that for the past two weeks since starting anastrozole she has enveloped and rash on her arms. The rash has raised bumps and is very itchy. She saw her PCP for the rash and was given a steroid injection and cortisone cream. The rash got better but is now back. She is questioning whether it is a reaction to the new medicaction.

## 2021-05-19 NOTE — Telephone Encounter (Signed)
Called patient and advised her to hold medication for two weeks and evaluate whether the rash improves.

## 2021-05-27 ENCOUNTER — Inpatient Hospital Stay: Payer: Medicare HMO | Admitting: Internal Medicine

## 2021-05-28 ENCOUNTER — Other Ambulatory Visit: Payer: Self-pay

## 2021-05-28 ENCOUNTER — Inpatient Hospital Stay: Payer: Medicare HMO | Attending: Oncology | Admitting: Internal Medicine

## 2021-05-28 ENCOUNTER — Encounter: Payer: Self-pay | Admitting: Internal Medicine

## 2021-05-28 DIAGNOSIS — D751 Secondary polycythemia: Secondary | ICD-10-CM | POA: Diagnosis not present

## 2021-05-28 DIAGNOSIS — M810 Age-related osteoporosis without current pathological fracture: Secondary | ICD-10-CM | POA: Insufficient documentation

## 2021-05-28 DIAGNOSIS — F17210 Nicotine dependence, cigarettes, uncomplicated: Secondary | ICD-10-CM | POA: Diagnosis not present

## 2021-05-28 DIAGNOSIS — C50812 Malignant neoplasm of overlapping sites of left female breast: Secondary | ICD-10-CM | POA: Diagnosis not present

## 2021-05-28 DIAGNOSIS — Z79899 Other long term (current) drug therapy: Secondary | ICD-10-CM | POA: Insufficient documentation

## 2021-05-28 DIAGNOSIS — Z79811 Long term (current) use of aromatase inhibitors: Secondary | ICD-10-CM | POA: Diagnosis not present

## 2021-05-28 DIAGNOSIS — Z170 Estrogen receptor positive status [ER+]: Secondary | ICD-10-CM | POA: Insufficient documentation

## 2021-05-28 NOTE — Progress Notes (Signed)
[facility] [place] NOTE  Patient Care Team: Tracie Harrier, MD as PCP - General (Internal Medicine) Theodore Demark, RN as Oncology Nurse Navigator Cammie Sickle, MD as Consulting Physician (Internal Medicine) Noreene Filbert, MD as Referring Physician (Radiation Oncology) Herbert Pun, MD as Consulting Physician (General Surgery)  CHIEF COMPLAINTS/PURPOSE OF CONSULTATION: Breast cancer  #  Oncology History Overview Note  #  BREAST, LEFT 2:00; ULTRASOUND-GUIDED BIOPSY:  - INVASIVE MAMMARY CARCINOMA WITH FEATURES SUGGESTIVE OF SOLID  PSEUDOPAPILLARY CARCINOMA; ER >90%; PR > 90%; he 2 Neu-NEG [Dr.Cinton]  PATHOLOGIC STAGE CLASSIFICATION (pTNM, AJCC 8th Edition):  TNM Descriptors: Not applicable  [id number]  Regional Lymph Nodes Modifier: sn  pN0  pM - Not applicable   SPECIAL STUDIES  Breast Biomarker Testing Performed on Previous Biopsy: [id number]  Estrogen Receptor (ER) Status: POSITIVE          Percentage of cells with nuclear positivity: Greater than 90%          Average intensity of staining: Strong   Progesterone Receptor (PgR) Status: POSITIVE          Percentage of cells with nuclear positivity: Greater than 90%          Average intensity of staining: Moderate   HER2 (by immunohistochemistry): NEGATIVE (Score 1+)  #Cancer screening : colonoscopy [since 56 years; again aug 2022];smoker;        Carcinoma of overlapping sites of left breast in female, estrogen receptor positive ([place])  01/19/2021 Initial Diagnosis   Carcinoma of overlapping sites of left breast in female, estrogen receptor positive ([place])   01/19/2021 Cancer Staging   Staging form: Breast, AJCC 8th Edition - Clinical: Stage IA (cT1a, cN0, cM0, G2, ER+, PR+, HER2-) - Signed by Cammie Sickle, MD on 01/19/2021 Histologic grading system: 3 grade system    Genetic Testing   Negative genetic testing. No pathogenic variants identified on the [facility] Multi-Cancer  Panel+RNA. The report date is 03/18/2021.  The Multi-Cancer Panel + RNA offered by [facility] includes sequencing and/or deletion duplication testing of the following 84 genes: AIP, ALK, APC, ATM, AXIN2,BAP1,  BARD1, BLM, BMPR1A, BRCA1, BRCA2, BRIP1, CASR, CDC73, CDH1, CDK4, CDKN1B, CDKN1C, CDKN2A (p14ARF), CDKN2A (p16INK4a), CEBPA, CHEK2, CTNNA1, DICER1, DIS3L2, EGFR (c.2369C>T, p.Thr790Met variant only), EPCAM (Deletion/duplication testing only), FH, FLCN, GATA2, GPC3, GREM1 (Promoter region deletion/duplication testing only), HOXB13 (c.251G>A, p.Gly84Glu), HRAS, KIT, MAX, MEN1, MET, MITF (c.952G>A, p.Glu318Lys variant only), MLH1, MSH2, MSH3, MSH6, MUTYH, NBN, NF1, NF2, NTHL1, PALB2, PDGFRA, PHOX2B, PMS2, POLD1, POLE, POT1, PRKAR1A, PTCH1, PTEN, RAD50, RAD51C, RAD51D, RB1, RECQL4, RET, RUNX1, SDHAF2, SDHA (sequence changes only), SDHB, SDHC, SDHD, SMAD4, SMARCA4, SMARCB1, SMARCE1, STK11, SUFU, TERC, TERT, TMEM127, TP53, TSC1, TSC2, VHL, WRN and WT1.      HISTORY OF PRESENTING ILLNESS:  Morgan Valdez 75 y.o.  female stage I breast cancer ER/PR positive HER2 negative-s/p adjuvant radiation /currently on adjuvant is here for follow-up/review results of the bone density.   Patient noted to have rash few days after taking anastrozole.  However question bug bites.  Patient is currently back on anastrozole.  Denies any further rash.  No itching.  Review of Systems  Constitutional:  Negative for chills, diaphoresis, fever, malaise/fatigue and weight loss.  HENT:  Negative for nosebleeds and sore throat.   Eyes:  Negative for double vision.  Respiratory:  Negative for cough, hemoptysis, sputum production, shortness of breath and wheezing.   Cardiovascular:  Negative for chest pain, palpitations, orthopnea and leg swelling.  Gastrointestinal:  Negative  for abdominal pain, blood in stool, constipation, diarrhea, heartburn, melena, nausea and vomiting.  Genitourinary:  Negative for dysuria, frequency and  urgency.  Musculoskeletal:  Positive for back pain. Negative for joint pain.  Skin:  Positive for rash. Negative for itching.  Neurological:  Negative for dizziness, tingling, focal weakness, weakness and headaches.  Endo/Heme/Allergies:  Does not bruise/bleed easily.  Psychiatric/Behavioral:  Negative for depression. The patient is not nervous/anxious and does not have insomnia.     MEDICAL HISTORY:  Past Medical History:  Diagnosis Date   Allergic state    Anxiety    Arthritis    Barrett's esophagus    Breast cancer (HCC)    Left   Chronic cough    COPD (chronic obstructive pulmonary disease) (HCC)    Depression    Duodenitis    Emphysema of lung (HCC)    Esophageal motility disorder    Family history of adrenal cancer    Family history of brain cancer    Family history of colon cancer    Family history of lung cancer    Family history of melanoma    Gastritis    GERD (gastroesophageal reflux disease)    Headache    migraines - 3-4x/yr   Osteoporosis, post-menopausal    Pre-diabetes    Tobacco abuse disorder    Varicose veins of legs     SURGICAL HISTORY: Past Surgical History:  Procedure Laterality Date   APPENDECTOMY     BREAST BIOPSY Left 01/02/2021   [place] WITH FEATURES SUGGESTIVE OF SOLID PSEUDOPAPILLARY CARCINOMA   BREAST LUMPECTOMY,RADIO FREQ LOCALIZER,AXILLARY SENTINEL LYMPH NODE BIOPSY Left 01/28/2021   Procedure: BREAST LUMPECTOMY,RADIO FREQ LOCALIZER,AXILLARY SENTINEL LYMPH NODE BIOPSY;  Surgeon: Herbert Pun, MD;  Location: [facility];  Service: General;  Laterality: Left;   CATARACT EXTRACTION W/PHACO Right 01/03/2017   Procedure: CATARACT EXTRACTION PHACO AND INTRAOCULAR LENS PLACEMENT ([place])  Right;  Surgeon: Ronnell Freshwater, MD;  Location: [place];  Service: Ophthalmology;  Laterality: Right;   CATARACT EXTRACTION W/PHACO Left 01/17/2017   Procedure: CATARACT EXTRACTION PHACO AND INTRAOCULAR LENS PLACEMENT ([facility]);  Surgeon:  Ronnell Freshwater, MD;  Location: [place];  Service: Ophthalmology;  Laterality: Left;  left   CHOLECYSTECTOMY     COLONOSCOPY WITH PROPOFOL N/A 04/06/2018   Procedure: COLONOSCOPY WITH PROPOFOL;  Surgeon: Lollie Sails, MD;  [facility];  Service: Endoscopy;  Laterality: N/A;   COLONOSCOPY WITH PROPOFOL N/A 06/08/2021   Procedure: COLONOSCOPY WITH PROPOFOL;  Surgeon: [place], Benay Pike, MD;  Location: [facility] ENDOSCOPY;  Service: Gastroenterology;  Laterality: N/A;   ESOPHAGOGASTRODUODENOSCOPY N/A 09/26/2018   Procedure: ESOPHAGOGASTRODUODENOSCOPY (EGD);  Surgeon: Lollie Sails, MD;  Location: [facility] ENDOSCOPY;  Service: Endoscopy;  Laterality: N/A;   ESOPHAGOGASTRODUODENOSCOPY (EGD) WITH PROPOFOL N/A 10/14/2015   Procedure: ESOPHAGOGASTRODUODENOSCOPY (EGD) WITH PROPOFOL;  Surgeon: Lollie Sails, MD;  Location: [facility] ENDOSCOPY;  Service: Endoscopy;  Laterality: N/A;   EYE SURGERY Bilateral 2019   cataracts    FRACTURE SURGERY     HERNIA REPAIR      SOCIAL HISTORY: Social History   Socioeconomic History   Marital status: Widowed    Spouse name: Not on file   Number of children: Not on file   Years of education: Not on file   Highest education level: Not on file  Occupational History   Not on file  Tobacco Use   Smoking status: Every Day    Packs/day: 0.50    Years: 50.00  Pack years: 25.00    Types: Cigarettes   Smokeless tobacco: Never  Vaping Use   Vaping Use: Never used  Substance and Sexual Activity   Alcohol use: Yes    Alcohol/week: 1.0 standard drink    Types: 1 Glasses of wine per week    Comment: rare - Holidays   Drug use: No   Sexual activity: Not on file  Other Topics Concern   Not on file  Social History Narrative   Lives in [place]; 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 adrenal gland; lung.    Dementia Father    Cancer Sister        colon cancer- 73s to liver.    Cancer Maternal Grandmother        brain cancer   Diabetes Maternal Grandfather    Stroke Maternal Grandfather    Rectal cancer Daughter        at 42 years; survived.    Melanoma Grandson        dx 22 on neck   Breast cancer Neg Hx     ALLERGIES:  is allergic to tape, nicotine, and bacitracin.  MEDICATIONS:  Current Outpatient Medications  Medication Sig Dispense Refill   ALPRAZolam (XANAX) 0.25 MG tablet Take 0.25 mg by mouth 2 (two) times daily as needed for anxiety. (Patient not taking: Reported on 06/08/2021)     anastrozole (ARIMIDEX) 1 MG tablet Take 1 tablet (1 mg total) by mouth daily. 90 tablet 1   buPROPion (WELLBUTRIN SR) 150 MG 12 hr tablet Take 150 mg by mouth daily.     cetirizine (ZYRTEC) 10 MG tablet Take 10 mg by mouth daily as needed for allergies.     clotrimazole-betamethasone (LOTRISONE) cream Apply 1 application topically 2 (two) times daily.     fluticasone (FLONASE) 50 MCG/ACT nasal spray Place 2 sprays into both nostrils daily as needed for allergies.     furosemide (LASIX) 20 MG tablet Take 20 mg by mouth as needed for fluid. (Patient not taking: Reported on 06/08/2021)     gabapentin (NEURONTIN) 100 MG capsule Take 100 mg by mouth 3 (three) times daily. Two capsules 3 times a day as needed     omeprazole (PRILOSEC) 40 MG capsule Take 40 mg by mouth in the morning and at bedtime.     ondansetron (ZOFRAN) 4 MG tablet Take 4 mg by mouth 2 (two) times daily as needed for nausea/vomiting.  0   Polyethyl Glycol-Propyl Glycol (SYSTANE) 0.4-0.3 % SOLN Place 1-2 drops into both eyes 3 (three) times daily  as needed (dry/irritated eyes).     Probiotic Product (PROBIOTIC ACIDOPHILUS BEADS) CAPS Take 1 capsule by mouth daily.     SPIRIVA HANDIHALER 18 MCG inhalation capsule Place 1 capsule into inhaler and inhale in the morning.     albuterol (VENTOLIN HFA) 108 (90 Base) MCG/ACT inhaler Inhale into the lungs every 6 (six) hours as needed for wheezing or shortness of breath.     montelukast (SINGULAIR) 10 MG tablet Take 10  mg by mouth at bedtime. (Patient not taking: Reported on 06/08/2021)     sertraline (ZOLOFT) 50 MG tablet Take 50 mg by mouth daily.     No current facility-administered medications for this visit.      Marland Kitchen  PHYSICAL EXAMINATION: ECOG PERFORMANCE STATUS: 0 - Asymptomatic  Vitals:   05/28/21 0913  BP: 135/78  Pulse: 72  Resp: 16  Temp: 98.4 F (36.9 C)   Filed Weights   05/28/21 0913  Weight: 147 lb 9.6 oz (67 kg)    Physical Exam Constitutional:      Comments: Ambulating independently.  Accompanied by family.  HENT:     Head: Normocephalic and atraumatic.     Mouth/Throat:     Pharynx: No oropharyngeal exudate.  Eyes:     Pupils: Pupils are equal, round, and reactive to light.  Cardiovascular:     Rate and Rhythm: Normal rate and regular rhythm.  Pulmonary:     Effort: Pulmonary effort is normal. No respiratory distress.     Breath sounds: Normal breath sounds. No wheezing.  Abdominal:     General: Bowel sounds are normal. There is no distension.     Palpations: Abdomen is soft. There is no mass.     Tenderness: There is no abdominal tenderness. There is no guarding or rebound.  Musculoskeletal:        General: No tenderness. Normal range of motion.     Cervical back: Normal range of motion and neck supple.  Skin:    General: Skin is warm.     Comments: Mild erythema in the radiation portal no desquamation.   Inframammary rash bilaterally-suggestive of fungal dermatitis.   Neurological:     Mental Status: She is alert and oriented to person, place,  and time.  Psychiatric:        Mood and Affect: Affect normal.     LABORATORY DATA:  I have reviewed the data as listed Lab Results  Component Value Date   WBC 7.5 03/18/2021   HGB 15.9 (H) 03/18/2021   HCT 47.7 (H) 03/18/2021   MCV 93.0 03/18/2021   PLT 236 03/18/2021   Recent Labs    01/19/21 1153  NA 137  K 4.4  CL 99  CO2 27  GLUCOSE 97  BUN 17  CREATININE 0.97  CALCIUM 9.1  GFRNONAA >60  PROT 7.2  ALBUMIN 4.1  AST 13*  ALT 11  ALKPHOS 59  BILITOT 0.6    RADIOGRAPHIC STUDIES: I have personally reviewed the radiological images as listed and agreed with the findings in the report. No results found.   ASSESSMENT & PLAN:   Carcinoma of overlapping sites of left breast in female, estrogen receptor positive ([place]) #  stage I ER/PR positive HER-2 negative left breast cancer-invasive mammary carcinoma. S/p RT [finished June 1st week, 2022].  Currently on anastrozole.  Tolerating well-question rash [see below].  Continue anastrozole for total of 5 years.  # ? Rash sec to "Beg bugs" vs- anastrozole [clinically less likely]-continue antihistamines.  #Osteoporosis : BMD T score -2.6 [July 2022]-. Discussed the potential risk factors for osteoporosis- age/gender/postmenopausal status/use of anti-estrogen treatments. Discussed multiple options including exercise/ calcium and vitamin D supplementation/ and also use of bisphosphonates/parenteral bisphosphonate/denosumab.  For now recommend conservative measures calcium plus vitamin D.  # Erythrocytosis Hb15.9--likely secondary/smoking.  Monitor for now.  Do not suspect any primary causes.  # Smoking: Active smoker; Discussed regarding lung cancer screening program at length; which includes low-dose CT scan on  annual basis for 5 years. Defer to Dr.Fleming re: LCSP.   # DISPOSITION:  # follow up in 2 months- MD; prior- Dr. B  All questions were answered. The patient/family knows to call the clinic with any problems,  questions or concerns.    Cammie Sickle, MD 06/11/2021 11:11 PM

## 2021-05-28 NOTE — Progress Notes (Signed)
Pt has had like chest pain and went to urgent care and then was told to go to ER and after work up it was reflux. She is on omeprazole.  She does not have much of appetite but she eats- just seems never hungry

## 2021-05-28 NOTE — Assessment & Plan Note (Addendum)
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 S/p RT [finished June 1st week, 2022].  Currently on anastrozole.  Tolerating well-question rash [see below].  Continue anastrozole for total of 5 years.  # ? Rash sec to "Beg bugs" vs- anastrozole [clinically less likely]-continue antihistamines.  #Osteoporosis : BMD T score -2.6 [July 2022]-. Discussed the potential risk factors for osteoporosis- age/gender/postmenopausal status/use of anti-estrogen treatments. Discussed multiple options including exercise/ calcium and vitamin D supplementation/ and also use of bisphosphonates/parenteral bisphosphonate/denosumab.  For now recommend conservative measures calcium plus vitamin D.  # Erythrocytosis Hb15.9--likely secondary/smoking.  Monitor for now.  Do not suspect any primary causes.  # Smoking: Active smoker; Discussed regarding lung cancer screening program at length; which includes low-dose CT scan on annual basis for 5 years. Defer to Dr.Fleming re: LCSP.   # DISPOSITION:  # follow up in 2 months- MD; prior- Dr. B 

## 2021-06-05 ENCOUNTER — Encounter: Payer: Self-pay | Admitting: Internal Medicine

## 2021-06-08 ENCOUNTER — Ambulatory Visit: Payer: Medicare HMO | Admitting: Certified Registered Nurse Anesthetist

## 2021-06-08 ENCOUNTER — Encounter: Payer: Self-pay | Admitting: Internal Medicine

## 2021-06-08 ENCOUNTER — Other Ambulatory Visit: Payer: Self-pay

## 2021-06-08 ENCOUNTER — Encounter
Admission: RE | Disposition: A | Discharge status: Home / Self Care | Payer: Self-pay | Source: Home / Self Care | Attending: Internal Medicine

## 2021-06-08 ENCOUNTER — Ambulatory Visit
Admission: RE | Admit: 2021-06-08 | Discharge: 2021-06-08 | Disposition: A | Discharge status: Home / Self Care | Payer: Medicare HMO | Attending: Internal Medicine | Admitting: Internal Medicine

## 2021-06-08 DIAGNOSIS — Z86010 Personal history of colonic polyps: Secondary | ICD-10-CM | POA: Diagnosis not present

## 2021-06-08 DIAGNOSIS — Z800 Family history of malignant neoplasm of digestive organs: Secondary | ICD-10-CM | POA: Diagnosis not present

## 2021-06-08 DIAGNOSIS — K640 First degree hemorrhoids: Secondary | ICD-10-CM | POA: Insufficient documentation

## 2021-06-08 DIAGNOSIS — K219 Gastro-esophageal reflux disease without esophagitis: Secondary | ICD-10-CM | POA: Diagnosis not present

## 2021-06-08 DIAGNOSIS — F17200 Nicotine dependence, unspecified, uncomplicated: Secondary | ICD-10-CM | POA: Insufficient documentation

## 2021-06-08 DIAGNOSIS — C50912 Malignant neoplasm of unspecified site of left female breast: Secondary | ICD-10-CM | POA: Insufficient documentation

## 2021-06-08 DIAGNOSIS — D122 Benign neoplasm of ascending colon: Secondary | ICD-10-CM | POA: Diagnosis not present

## 2021-06-08 DIAGNOSIS — Z1211 Encounter for screening for malignant neoplasm of colon: Secondary | ICD-10-CM | POA: Insufficient documentation

## 2021-06-08 DIAGNOSIS — K5730 Diverticulosis of large intestine without perforation or abscess without bleeding: Secondary | ICD-10-CM | POA: Insufficient documentation

## 2021-06-08 DIAGNOSIS — D123 Benign neoplasm of transverse colon: Secondary | ICD-10-CM | POA: Diagnosis not present

## 2021-06-08 DIAGNOSIS — Z91048 Other nonmedicinal substance allergy status: Secondary | ICD-10-CM | POA: Insufficient documentation

## 2021-06-08 DIAGNOSIS — K635 Polyp of colon: Secondary | ICD-10-CM | POA: Diagnosis not present

## 2021-06-08 DIAGNOSIS — Z881 Allergy status to other antibiotic agents status: Secondary | ICD-10-CM | POA: Diagnosis not present

## 2021-06-08 DIAGNOSIS — Z79899 Other long term (current) drug therapy: Secondary | ICD-10-CM | POA: Insufficient documentation

## 2021-06-08 DIAGNOSIS — Z888 Allergy status to other drugs, medicaments and biological substances status: Secondary | ICD-10-CM | POA: Diagnosis not present

## 2021-06-08 DIAGNOSIS — K648 Other hemorrhoids: Secondary | ICD-10-CM | POA: Diagnosis not present

## 2021-06-08 HISTORY — DX: Emphysema, unspecified: J43.9

## 2021-06-08 HISTORY — DX: Age-related osteoporosis without current pathological fracture: M81.0

## 2021-06-08 HISTORY — DX: Malignant neoplasm of unspecified site of unspecified female breast: C50.919

## 2021-06-08 HISTORY — DX: Anxiety disorder, unspecified: F41.9

## 2021-06-08 HISTORY — PX: COLONOSCOPY WITH PROPOFOL: SHX5780

## 2021-06-08 SURGERY — COLONOSCOPY WITH PROPOFOL
Anesthesia: General

## 2021-06-08 MED ORDER — LIDOCAINE HCL (PF) 2 % IJ SOLN
INTRAMUSCULAR | Status: AC
  Filled 2021-06-08: qty 5

## 2021-06-08 MED ORDER — SODIUM CHLORIDE 0.9 % IV SOLN: INTRAVENOUS | Status: DC

## 2021-06-08 MED ORDER — PROPOFOL 500 MG/50ML IV EMUL
INTRAVENOUS | Status: AC
  Filled 2021-06-08: qty 50

## 2021-06-08 MED ORDER — LIDOCAINE HCL (CARDIAC) PF 100 MG/5ML IV SOSY
PREFILLED_SYRINGE | INTRAVENOUS | Status: DC | PRN
  Administered 2021-06-08: 50 mg via INTRAVENOUS

## 2021-06-08 MED ORDER — PROPOFOL 500 MG/50ML IV EMUL
INTRAVENOUS | Status: DC | PRN
  Administered 2021-06-08: 130 ug/kg/min via INTRAVENOUS

## 2021-06-08 MED ORDER — PROPOFOL 10 MG/ML IV BOLUS
INTRAVENOUS | Status: DC | PRN
  Administered 2021-06-08: 50 mg via INTRAVENOUS
  Administered 2021-06-08: 10 mg via INTRAVENOUS

## 2021-06-08 NOTE — Anesthesia Preprocedure Evaluation (Signed)
Anesthesia Evaluation  Patient identified by MRN, date of birth, ID band Patient awake    Reviewed: Allergy & Precautions, H&P , NPO status , Patient's Chart, lab work & pertinent test results, reviewed documented beta blocker date and time   Airway Mallampati: II   Neck ROM: full    Dental  (+) Poor Dentition   Pulmonary COPD, Current Smoker and Patient abstained from smoking.,    Pulmonary exam normal        Cardiovascular Exercise Tolerance: Poor negative cardio ROS Normal cardiovascular exam Rhythm:regular Rate:Normal     Neuro/Psych  Headaches, PSYCHIATRIC DISORDERS Anxiety Depression    GI/Hepatic Neg liver ROS, GERD  Medicated,  Endo/Other  negative endocrine ROS  Renal/GU negative Renal ROS  negative genitourinary   Musculoskeletal   Abdominal   Peds  Hematology negative hematology ROS (+)   Anesthesia Other Findings Past Medical History: No date: Allergic state No date: Anxiety No date: Arthritis No date: Barrett's esophagus No date: Breast cancer (HCC)     Comment:  Left No date: Chronic cough No date: COPD (chronic obstructive pulmonary disease) (HCC) No date: Depression No date: Duodenitis No date: Emphysema of lung (HCC) No date: Esophageal motility disorder No date: Family history of adrenal cancer No date: Family history of brain cancer No date: Family history of colon cancer No date: Family history of lung cancer No date: Family history of melanoma No date: Gastritis No date: GERD (gastroesophageal reflux disease) No date: Headache     Comment:  migraines - 3-4x/yr No date: Osteoporosis, post-menopausal No date: Pre-diabetes No date: Tobacco abuse disorder No date: Varicose veins of legs Past Surgical History: No date: APPENDECTOMY 01/02/2021: BREAST BIOPSY; Left     Comment:  [facility] WITH FEATURES SUGGESTIVE OF SOLID PSEUDOPAPILLARY               CARCINOMA 01/28/2021: BREAST  LUMPECTOMY,RADIO FREQ LOCALIZER,AXILLARY SENTINEL  LYMPH NODE BIOPSY; Left     Comment:  Procedure: BREAST LUMPECTOMY,RADIO FREQ               LOCALIZER,AXILLARY SENTINEL LYMPH NODE BIOPSY;  Surgeon:               Herbert Pun, MD;  Location: [facility] OR[facility];  Service:              General;  Laterality: Left; 01/03/2017: CATARACT EXTRACTION W/PHACO; Right     Comment:  Procedure: CATARACT EXTRACTION PHACO AND INTRAOCULAR               LENS PLACEMENT ([place])  Right;  Surgeon: Ronnell Freshwater, MD;  Location: [place];  Service:              Ophthalmology;  Laterality: Right; 01/17/2017: CATARACT EXTRACTION W/PHACO; Left     Comment:  Procedure: CATARACT EXTRACTION PHACO AND INTRAOCULAR               LENS PLACEMENT ([facility]);  Surgeon: Ronnell Freshwater,              MD;  Location: [place];  Service:               Ophthalmology;  Laterality: Left;  left No date: CHOLECYSTECTOMY 04/06/2018: COLONOSCOPY WITH PROPOFOL; N/A     Comment:  Procedure: COLONOSCOPY WITH PROPOFOL;  Surgeon:               Lollie Sails, MD;  Location: [facility] ENDOSCOPY;                Service: Endoscopy;  Laterality: N/A; 09/26/2018: ESOPHAGOGASTRODUODENOSCOPY; N/A     Comment:  Procedure: ESOPHAGOGASTRODUODENOSCOPY (EGD);  Surgeon:               Lollie Sails, MD;  Location: [facility] ENDOSCOPY;                Service: Endoscopy;  Laterality: N/A; 10/14/2015: ESOPHAGOGASTRODUODENOSCOPY (EGD) WITH PROPOFOL; N/A     Comment:  Procedure: ESOPHAGOGASTRODUODENOSCOPY (EGD) WITH               PROPOFOL;  Surgeon: Lollie Sails, MD;  Location:               [facility] ENDOSCOPY;  Service: Endoscopy;  Laterality: N/A; 2019: EYE SURGERY; Bilateral     Comment:  cataracts  No date: FRACTURE SURGERY No date: HERNIA REPAIR   Reproductive/Obstetrics negative OB ROS                             Anesthesia Physical Anesthesia Plan  ASA: 3  Anesthesia  Plan: General   Post-op Pain Management:    Induction:   PONV Risk Score and Plan:   Airway Management Planned:   Additional Equipment:   Intra-op Plan:   Post-operative Plan:   Informed Consent: I have reviewed the patients History and Physical, chart, labs and discussed the procedure including the risks, benefits and alternatives for the proposed anesthesia with the patient or authorized representative who has indicated his/her understanding and acceptance.     Dental Advisory Given  Plan Discussed with: CRNA  Anesthesia Plan Comments:         Anesthesia Quick Evaluation

## 2021-06-08 NOTE — H&P (Signed)
Outpatient short stay form Pre-procedure 06/08/2021 1:10 PM Lamica Mccart K. Alice Reichert, M.D.  Primary Physician: Tracie Harrier, M.D.  Reason for visit:  Personal history of adenomatous colon polyps  History of present illness:  15 y/. Patient is due for repeat surveillance at this time. Last colonoscopy 04/2018 showed 5 subcentimeter tubular adenomas. Patient denies change in bowel habits, rectal bleeding, weight loss or abdominal pain.      Current Facility-Administered Medications:    0.9 %  sodium chloride infusion, , Intravenous, Continuous, Elroy Schembri, Benay Pike, MD  Medications Prior to Admission  Medication Sig Dispense Refill Last Dose   albuterol (VENTOLIN HFA) 108 (90 Base) MCG/ACT inhaler Inhale into the lungs every 6 (six) hours as needed for wheezing or shortness of breath.   06/08/2021 at 1000   anastrozole (ARIMIDEX) 1 MG tablet Take 1 tablet (1 mg total) by mouth daily. 90 tablet 1 06/07/2021   buPROPion (WELLBUTRIN SR) 150 MG 12 hr tablet Take 150 mg by mouth daily.   06/07/2021   cetirizine (ZYRTEC) 10 MG tablet Take 10 mg by mouth daily as needed for allergies.   06/07/2021   clotrimazole-betamethasone (LOTRISONE) cream Apply 1 application topically 2 (two) times daily.   Past Week   fluticasone (FLONASE) 50 MCG/ACT nasal spray Place 2 sprays into both nostrils daily as needed for allergies.   Past Month   gabapentin (NEURONTIN) 100 MG capsule Take 100 mg by mouth 3 (three) times daily. Two capsules 3 times a day as needed   Past Month   omeprazole (PRILOSEC) 40 MG capsule Take 40 mg by mouth in the morning and at bedtime.   06/07/2021   ondansetron (ZOFRAN) 4 MG tablet Take 4 mg by mouth 2 (two) times daily as needed for nausea/vomiting.  0 Past Week   Polyethyl Glycol-Propyl Glycol (SYSTANE) 0.4-0.3 % SOLN Place 1-2 drops into both eyes 3 (three) times daily as needed (dry/irritated eyes).   Past Week   Probiotic Product (PROBIOTIC ACIDOPHILUS BEADS) CAPS Take 1 capsule by mouth daily.    Past Week   sertraline (ZOLOFT) 50 MG tablet Take 50 mg by mouth daily.   06/07/2021   SPIRIVA HANDIHALER 18 MCG inhalation capsule Place 1 capsule into inhaler and inhale in the morning.   06/08/2021   ALPRAZolam (XANAX) 0.25 MG tablet Take 0.25 mg by mouth 2 (two) times daily as needed for anxiety. (Patient not taking: Reported on 06/08/2021)   Not Taking   furosemide (LASIX) 20 MG tablet Take 20 mg by mouth as needed for fluid. (Patient not taking: Reported on 06/08/2021)   Not Taking   montelukast (SINGULAIR) 10 MG tablet Take 10 mg by mouth at bedtime. (Patient not taking: Reported on 06/08/2021)   Not Taking     Allergies  Allergen Reactions   Tape Dermatitis    If stayed on for a long time   Nicotine     Nicotine patch and causes blisters and red   Bacitracin Rash     Past Medical History:  Diagnosis Date   Allergic state    Anxiety    Arthritis    Barrett's esophagus    Breast cancer (HCC)    Left   Chronic cough    COPD (chronic obstructive pulmonary disease) (HCC)    Depression    Duodenitis    Emphysema of lung (HCC)    Esophageal motility disorder    Family history of adrenal cancer    Family history of brain cancer    Family  history of colon cancer    Family history of lung cancer    Family history of melanoma    Gastritis    GERD (gastroesophageal reflux disease)    Headache    migraines - 3-4x/yr   Osteoporosis, post-menopausal    Pre-diabetes    Tobacco abuse disorder    Varicose veins of legs     Review of systems:  Otherwise negative.    Physical Exam  Gen: Alert, oriented. Appears stated age.  HEENT: [place]/AT. PERRLA. Lungs: CTA, no wheezes. CV: RR nl S1, S2. Abd: soft, benign, no masses. BS+ Ext: No edema. Pulses 2+    Planned procedures: Proceed with colonoscopy. The patient understands the nature of the planned procedure, indications, risks, alternatives and potential complications including but not limited to bleeding, infection, perforation,  damage to internal organs and possible oversedation/side effects from anesthesia. The patient agrees and gives consent to proceed.  Please refer to procedure notes for findings, recommendations and patient disposition/instructions.     Myrna Vonseggern K. Alice Reichert, M.D. Gastroenterology 06/08/2021  1:10 PM

## 2021-06-08 NOTE — Op Note (Signed)
[facility] Gastroenterology Patient Name: Morgan Valdez Procedure Date: 06/08/2021 2:11 PM MRN: [id number] Account #: [id number] Date of Birth: Jul 27, 1946 Admit Type: Outpatient Age: 75 Room: [facility] ENDO ROOM 2 Gender: Female Note Status: Finalized Procedure:             Colonoscopy Indications:           Surveillance: Personal history of adenomatous polyps                         on last colonoscopy > 5 years ago Providers:             Lorie Apley K. Alice Reichert MD, MD Referring MD:          Tracie Harrier, MD (Referring MD) Medicines:             Propofol per Anesthesia Complications:         No immediate complications. Procedure:             Pre-Anesthesia Assessment:                        - The risks and benefits of the procedure and the                         sedation options and risks were discussed with the                         patient. All questions were answered and informed                         consent was obtained.                        - Patient identification and proposed procedure were                         verified prior to the procedure by the nurse. The                         procedure was verified in the procedure room.                        - ASA Grade Assessment: III - A patient with severe                         systemic disease.                        - After reviewing the risks and benefits, the patient                         was deemed in satisfactory condition to undergo the                         procedure.                        After obtaining informed consent, the colonoscope was                         passed under direct vision.  Throughout the procedure,                         the patient's blood pressure, pulse, and oxygen                         saturations were monitored continuously. The                         Colonoscope was introduced through the anus and                         advanced to the the cecum, identified  by appendiceal                         orifice and ileocecal valve. The colonoscopy was                         performed without difficulty. The patient tolerated                         the procedure well. The quality of the bowel                         preparation was good. The ileocecal valve, appendiceal                         orifice, and rectum were photographed. Findings:      The perianal and digital rectal examinations were normal. Pertinent       negatives include normal sphincter tone and no palpable rectal lesions.      Non-bleeding internal hemorrhoids were found during retroflexion. The       hemorrhoids were Grade I (internal hemorrhoids that do not prolapse).      Multiple small and large-mouthed diverticula were found in the sigmoid       colon. There was no evidence of diverticular bleeding.      Two sessile polyps were found in the hepatic flexure and ascending       colon. The polyps were 4 to 6 mm in size. These polyps were removed with       a jumbo cold forceps. Resection and retrieval were complete.      The exam was otherwise without abnormality. Impression:            - Non-bleeding internal hemorrhoids.                        - Severe diverticulosis in the sigmoid colon. There                         was no evidence of diverticular bleeding.                        - Two 4 to 6 mm polyps at the hepatic flexure and in                         the ascending colon, removed with a jumbo cold  forceps. Resected and retrieved.                        - The examination was otherwise normal. Recommendation:        - Patient has a contact number available for                         emergencies. The signs and symptoms of potential                         delayed complications were discussed with the patient.                         Return to normal activities tomorrow. Written                         discharge instructions were provided to the  patient.                        - Resume previous diet.                        - Continue present medications.                        - Repeat colonoscopy is recommended for surveillance.                         The colonoscopy date will be determined after                         pathology results from today's exam become available                         for review.                        - Return to GI office PRN.                        - The findings and recommendations were discussed with                         the patient. Procedure Code(s):     --- Professional ---                        [phone], Colonoscopy, flexible; with biopsy, single or                         multiple Diagnosis Code(s):     --- Professional ---                        K57.30, Diverticulosis of large intestine without                         perforation or abscess without bleeding                        K63.5, Polyp of colon  K64.0, First degree hemorrhoids                        Z86.010, Personal history of colonic polyps CPT copyright 2019 American Medical Association. All rights reserved. The codes documented in this report are preliminary and upon coder review may  be revised to meet current compliance requirements. Efrain Sella MD, MD 06/08/2021 2:47:57 PM This report has been signed electronically. Number of Addenda: 0 Note Initiated On: 06/08/2021 2:11 PM Scope Withdrawal Time: 0 hours 8 minutes 12 seconds  Total Procedure Duration: 0 hours 18 minutes 32 seconds  Estimated Blood Loss:  Estimated blood loss: none.      [facility]

## 2021-06-08 NOTE — Interval H&P Note (Signed)
History and Physical Interval Note:  06/08/2021 1:11 PM  Morgan Valdez  has presented today for surgery, with the diagnosis of PERSONAL HX.OF COLON POLYPS.  The various methods of treatment have been discussed with the patient and family. After consideration of risks, benefits and other options for treatment, the patient has consented to  Procedure(s): COLONOSCOPY WITH PROPOFOL (N/A) as a surgical intervention.  The patient's history has been reviewed, patient examined, no change in status, stable for surgery.  I have reviewed the patient's chart and labs.  Questions were answered to the patient's satisfaction.     [place], [place]

## 2021-06-08 NOTE — Transfer of Care (Signed)
Immediate Anesthesia Transfer of Care Note  Patient: Morgan Valdez  Procedure(s) Performed: COLONOSCOPY WITH PROPOFOL  Patient Location: Endoscopy Unit  Anesthesia Type:General  Level of Consciousness: drowsy  Airway & Oxygen Therapy: Patient Spontanous Breathing  Post-op Assessment: Report given to RN and Post -op Vital signs reviewed and stable  Post vital signs: Reviewed and stable  Last Vitals:  Vitals Value Taken Time  BP 153/83 06/08/21 1447  Temp    Pulse 81 06/08/21 1447  Resp 24 06/08/21 1447  SpO2 98 % 06/08/21 1447  Vitals shown include unvalidated device data.  Last Pain:  Vitals:   06/08/21 1309  TempSrc: Temporal  PainSc: 0-No pain         Complications: No notable events documented.

## 2021-06-09 NOTE — Anesthesia Postprocedure Evaluation (Signed)
Anesthesia Post Note  Patient: Morgan Valdez  Procedure(s) Performed: COLONOSCOPY WITH PROPOFOL  Patient location during evaluation: PACU Anesthesia Type: General Level of consciousness: awake and alert Pain management: pain level controlled Vital Signs Assessment: post-procedure vital signs reviewed and stable Respiratory status: spontaneous breathing, nonlabored ventilation, respiratory function stable and patient connected to nasal cannula oxygen Cardiovascular status: blood pressure returned to baseline and stable Postop Assessment: no apparent nausea or vomiting Anesthetic complications: no   No notable events documented.   Last Vitals:  Vitals:   06/08/21 1507 06/08/21 1517  BP: (!) 165/83 (!) 147/85  Pulse: 77 81  Resp: 20 (!) 24  Temp:    SpO2: 100% 100%    Last Pain:  Vitals:   06/09/21 0741  TempSrc:   PainSc: 0-No pain                 Molli Barrows

## 2021-06-11 LAB — SURGICAL PATHOLOGY

## 2021-06-12 DIAGNOSIS — J449 Chronic obstructive pulmonary disease, unspecified: Secondary | ICD-10-CM | POA: Diagnosis not present

## 2021-06-12 DIAGNOSIS — C50812 Malignant neoplasm of overlapping sites of left female breast: Secondary | ICD-10-CM | POA: Diagnosis not present

## 2021-06-12 DIAGNOSIS — I700 Atherosclerosis of aorta: Secondary | ICD-10-CM | POA: Diagnosis not present

## 2021-06-12 DIAGNOSIS — R7309 Other abnormal glucose: Secondary | ICD-10-CM | POA: Diagnosis not present

## 2021-06-12 DIAGNOSIS — F3341 Major depressive disorder, recurrent, in partial remission: Secondary | ICD-10-CM | POA: Diagnosis not present

## 2021-06-12 DIAGNOSIS — Z720 Tobacco use: Secondary | ICD-10-CM | POA: Diagnosis not present

## 2021-06-12 DIAGNOSIS — G2581 Restless legs syndrome: Secondary | ICD-10-CM | POA: Diagnosis not present

## 2021-06-12 DIAGNOSIS — Z170 Estrogen receptor positive status [ER+]: Secondary | ICD-10-CM | POA: Diagnosis not present

## 2021-06-12 DIAGNOSIS — R0789 Other chest pain: Secondary | ICD-10-CM | POA: Diagnosis not present

## 2021-06-17 DIAGNOSIS — F329 Major depressive disorder, single episode, unspecified: Secondary | ICD-10-CM | POA: Diagnosis not present

## 2021-06-17 DIAGNOSIS — J3089 Other allergic rhinitis: Secondary | ICD-10-CM | POA: Diagnosis not present

## 2021-06-17 DIAGNOSIS — J449 Chronic obstructive pulmonary disease, unspecified: Secondary | ICD-10-CM | POA: Diagnosis not present

## 2021-06-17 DIAGNOSIS — R7303 Prediabetes: Secondary | ICD-10-CM | POA: Diagnosis not present

## 2021-06-17 DIAGNOSIS — G2581 Restless legs syndrome: Secondary | ICD-10-CM | POA: Diagnosis not present

## 2021-06-17 DIAGNOSIS — R209 Unspecified disturbances of skin sensation: Secondary | ICD-10-CM | POA: Diagnosis not present

## 2021-06-17 DIAGNOSIS — L299 Pruritus, unspecified: Secondary | ICD-10-CM | POA: Diagnosis not present

## 2021-06-17 DIAGNOSIS — F17210 Nicotine dependence, cigarettes, uncomplicated: Secondary | ICD-10-CM | POA: Diagnosis not present

## 2021-06-17 DIAGNOSIS — Z853 Personal history of malignant neoplasm of breast: Secondary | ICD-10-CM | POA: Diagnosis not present

## 2021-07-07 ENCOUNTER — Encounter: Payer: Self-pay | Admitting: Radiation Oncology

## 2021-07-15 DIAGNOSIS — S61412A Laceration without foreign body of left hand, initial encounter: Secondary | ICD-10-CM | POA: Diagnosis not present

## 2021-07-15 DIAGNOSIS — Z79811 Long term (current) use of aromatase inhibitors: Secondary | ICD-10-CM | POA: Diagnosis not present

## 2021-07-15 DIAGNOSIS — F17200 Nicotine dependence, unspecified, uncomplicated: Secondary | ICD-10-CM | POA: Diagnosis not present

## 2021-07-15 DIAGNOSIS — J449 Chronic obstructive pulmonary disease, unspecified: Secondary | ICD-10-CM | POA: Diagnosis not present

## 2021-07-15 DIAGNOSIS — Z79899 Other long term (current) drug therapy: Secondary | ICD-10-CM | POA: Diagnosis not present

## 2021-07-15 DIAGNOSIS — S61213A Laceration without foreign body of left middle finger without damage to nail, initial encounter: Secondary | ICD-10-CM | POA: Diagnosis not present

## 2021-07-15 DIAGNOSIS — S61215A Laceration without foreign body of left ring finger without damage to nail, initial encounter: Secondary | ICD-10-CM | POA: Diagnosis not present

## 2021-07-16 DIAGNOSIS — G8929 Other chronic pain: Secondary | ICD-10-CM | POA: Diagnosis not present

## 2021-07-16 DIAGNOSIS — Z720 Tobacco use: Secondary | ICD-10-CM | POA: Diagnosis not present

## 2021-07-16 DIAGNOSIS — G9519 Other vascular myelopathies: Secondary | ICD-10-CM | POA: Diagnosis not present

## 2021-07-16 DIAGNOSIS — M5441 Lumbago with sciatica, right side: Secondary | ICD-10-CM | POA: Diagnosis not present

## 2021-07-17 DIAGNOSIS — J449 Chronic obstructive pulmonary disease, unspecified: Secondary | ICD-10-CM | POA: Diagnosis not present

## 2021-07-17 DIAGNOSIS — Z853 Personal history of malignant neoplasm of breast: Secondary | ICD-10-CM | POA: Diagnosis not present

## 2021-07-17 DIAGNOSIS — S61214A Laceration without foreign body of right ring finger without damage to nail, initial encounter: Secondary | ICD-10-CM | POA: Diagnosis not present

## 2021-07-17 DIAGNOSIS — F17210 Nicotine dependence, cigarettes, uncomplicated: Secondary | ICD-10-CM | POA: Diagnosis not present

## 2021-07-17 DIAGNOSIS — S61213A Laceration without foreign body of left middle finger without damage to nail, initial encounter: Secondary | ICD-10-CM | POA: Diagnosis not present

## 2021-07-17 DIAGNOSIS — F329 Major depressive disorder, single episode, unspecified: Secondary | ICD-10-CM | POA: Diagnosis not present

## 2021-07-21 DIAGNOSIS — Z0100 Encounter for examination of eyes and vision without abnormal findings: Secondary | ICD-10-CM | POA: Diagnosis not present

## 2021-07-21 DIAGNOSIS — H43813 Vitreous degeneration, bilateral: Secondary | ICD-10-CM | POA: Diagnosis not present

## 2021-07-23 DIAGNOSIS — S61213A Laceration without foreign body of left middle finger without damage to nail, initial encounter: Secondary | ICD-10-CM | POA: Diagnosis not present

## 2021-07-23 DIAGNOSIS — S61203A Unspecified open wound of left middle finger without damage to nail, initial encounter: Secondary | ICD-10-CM | POA: Diagnosis not present

## 2021-07-23 DIAGNOSIS — S61205A Unspecified open wound of left ring finger without damage to nail, initial encounter: Secondary | ICD-10-CM | POA: Diagnosis not present

## 2021-07-23 DIAGNOSIS — S61215A Laceration without foreign body of left ring finger without damage to nail, initial encounter: Secondary | ICD-10-CM | POA: Diagnosis not present

## 2021-07-26 DIAGNOSIS — Z79899 Other long term (current) drug therapy: Secondary | ICD-10-CM | POA: Diagnosis not present

## 2021-07-26 DIAGNOSIS — H538 Other visual disturbances: Secondary | ICD-10-CM | POA: Diagnosis not present

## 2021-07-26 DIAGNOSIS — H5789 Other specified disorders of eye and adnexa: Secondary | ICD-10-CM | POA: Diagnosis not present

## 2021-07-26 DIAGNOSIS — F17210 Nicotine dependence, cigarettes, uncomplicated: Secondary | ICD-10-CM | POA: Diagnosis not present

## 2021-07-26 DIAGNOSIS — H5712 Ocular pain, left eye: Secondary | ICD-10-CM | POA: Diagnosis not present

## 2021-07-26 DIAGNOSIS — J449 Chronic obstructive pulmonary disease, unspecified: Secondary | ICD-10-CM | POA: Diagnosis not present

## 2021-07-26 DIAGNOSIS — Z859 Personal history of malignant neoplasm, unspecified: Secondary | ICD-10-CM | POA: Diagnosis not present

## 2021-07-26 DIAGNOSIS — Z79811 Long term (current) use of aromatase inhibitors: Secondary | ICD-10-CM | POA: Diagnosis not present

## 2021-07-26 DIAGNOSIS — S0502XA Injury of conjunctiva and corneal abrasion without foreign body, left eye, initial encounter: Secondary | ICD-10-CM | POA: Diagnosis not present

## 2021-07-29 DIAGNOSIS — Z4802 Encounter for removal of sutures: Secondary | ICD-10-CM | POA: Diagnosis not present

## 2021-07-30 ENCOUNTER — Encounter: Payer: Self-pay | Admitting: Internal Medicine

## 2021-07-30 ENCOUNTER — Inpatient Hospital Stay: Payer: Medicare HMO | Attending: Oncology | Admitting: Internal Medicine

## 2021-07-30 ENCOUNTER — Other Ambulatory Visit: Payer: Self-pay

## 2021-07-30 DIAGNOSIS — C50812 Malignant neoplasm of overlapping sites of left female breast: Secondary | ICD-10-CM | POA: Insufficient documentation

## 2021-07-30 DIAGNOSIS — Z170 Estrogen receptor positive status [ER+]: Secondary | ICD-10-CM | POA: Diagnosis not present

## 2021-07-30 DIAGNOSIS — F17210 Nicotine dependence, cigarettes, uncomplicated: Secondary | ICD-10-CM | POA: Diagnosis not present

## 2021-07-30 DIAGNOSIS — Z79899 Other long term (current) drug therapy: Secondary | ICD-10-CM | POA: Insufficient documentation

## 2021-07-30 DIAGNOSIS — M810 Age-related osteoporosis without current pathological fracture: Secondary | ICD-10-CM | POA: Insufficient documentation

## 2021-07-30 DIAGNOSIS — Z79811 Long term (current) use of aromatase inhibitors: Secondary | ICD-10-CM | POA: Insufficient documentation

## 2021-07-30 DIAGNOSIS — D751 Secondary polycythemia: Secondary | ICD-10-CM | POA: Insufficient documentation

## 2021-07-30 DIAGNOSIS — S0502XA Injury of conjunctiva and corneal abrasion without foreign body, left eye, initial encounter: Secondary | ICD-10-CM | POA: Diagnosis not present

## 2021-07-30 NOTE — Progress Notes (Signed)
[facility] [place] NOTE  Patient Care Team: Tracie Harrier, MD as PCP - General (Internal Medicine) Theodore Demark, RN as Oncology Nurse Navigator Cammie Sickle, MD as Consulting Physician (Internal Medicine) Noreene Filbert, MD as Referring Physician (Radiation Oncology) Herbert Pun, MD as Consulting Physician (General Surgery)  CHIEF COMPLAINTS/PURPOSE OF CONSULTATION: Breast cancer  #  Oncology History Overview Note  #  BREAST, LEFT 2:00; ULTRASOUND-GUIDED BIOPSY:  - INVASIVE MAMMARY CARCINOMA WITH FEATURES SUGGESTIVE OF SOLID  PSEUDOPAPILLARY CARCINOMA; ER >90%; PR > 90%; he 2 Neu-NEG [Dr.Cinton]  PATHOLOGIC STAGE CLASSIFICATION (pTNM, AJCC 8th Edition):  TNM Descriptors: Not applicable  RW4R  Regional Lymph Nodes Modifier: sn  pN0  pM - Not applicable   SPECIAL STUDIES  Breast Biomarker Testing Performed on Previous Biopsy: [id number]  Estrogen Receptor (ER) Status: POSITIVE          Percentage of cells with nuclear positivity: Greater than 90%          Average intensity of staining: Strong   Progesterone Receptor (PgR) Status: POSITIVE          Percentage of cells with nuclear positivity: Greater than 90%          Average intensity of staining: Moderate   HER2 (by immunohistochemistry): NEGATIVE (Score 1+)  #Cancer screening : colonoscopy [since 56 years; again aug 2022];smoker;        Carcinoma of overlapping sites of left breast in female, estrogen receptor positive ([place])  01/19/2021 Initial Diagnosis   Carcinoma of overlapping sites of left breast in female, estrogen receptor positive ([place])   01/19/2021 Cancer Staging   Staging form: Breast, AJCC 8th Edition - Clinical: Stage IA (cT1a, cN0, cM0, G2, ER+, PR+, HER2-) - Signed by Cammie Sickle, MD on 01/19/2021 Histologic grading system: 3 grade system    Genetic Testing   Negative genetic testing. No pathogenic variants identified on the [facility] Multi-Cancer  Panel+RNA. The report date is 03/18/2021.  The Multi-Cancer Panel + RNA offered by [facility] includes sequencing and/or deletion duplication testing of the following 84 genes: AIP, ALK, APC, ATM, AXIN2,BAP1,  BARD1, BLM, BMPR1A, BRCA1, BRCA2, BRIP1, CASR, CDC73, CDH1, CDK4, CDKN1B, CDKN1C, CDKN2A (p14ARF), CDKN2A (p16INK4a), CEBPA, CHEK2, CTNNA1, DICER1, DIS3L2, EGFR (c.2369C>T, p.Thr790Met variant only), EPCAM (Deletion/duplication testing only), FH, FLCN, GATA2, GPC3, GREM1 (Promoter region deletion/duplication testing only), HOXB13 (c.251G>A, p.Gly84Glu), HRAS, KIT, MAX, MEN1, MET, MITF (c.952G>A, p.Glu318Lys variant only), MLH1, MSH2, MSH3, MSH6, MUTYH, NBN, NF1, NF2, NTHL1, PALB2, PDGFRA, PHOX2B, PMS2, POLD1, POLE, POT1, PRKAR1A, PTCH1, PTEN, RAD50, RAD51C, RAD51D, RB1, RECQL4, RET, RUNX1, SDHAF2, SDHA (sequence changes only), SDHB, SDHC, SDHD, SMAD4, SMARCA4, SMARCB1, SMARCE1, STK11, SUFU, TERC, TERT, TMEM127, TP53, TSC1, TSC2, VHL, WRN and WT1.      HISTORY OF PRESENTING ILLNESS: Ambulating independently.  Alone.  Morgan Valdez 75 y.o.  female stage I breast cancer ER/PR positive HER2 negative-anastrozole is here for follow-up.  No skin rashes.  No nausea no vomiting.  No hot flashes.  No worsening joint pains.   Scratch the eye awaiting evaluation with ophthalmology today.  Review of Systems  Constitutional:  Negative for chills, diaphoresis, fever, malaise/fatigue and weight loss.  HENT:  Negative for nosebleeds and sore throat.   Eyes:  Negative for double vision.  Respiratory:  Negative for cough, hemoptysis, sputum production, shortness of breath and wheezing.   Cardiovascular:  Negative for chest pain, palpitations, orthopnea and leg swelling.  Gastrointestinal:  Negative for abdominal pain, blood in stool, constipation, diarrhea, heartburn, melena,  nausea and vomiting.  Genitourinary:  Negative for dysuria, frequency and urgency.  Musculoskeletal:  Positive for back pain. Negative  for joint pain.  Skin:  Positive for rash. Negative for itching.  Neurological:  Negative for dizziness, tingling, focal weakness, weakness and headaches.  Endo/Heme/Allergies:  Does not bruise/bleed easily.  Psychiatric/Behavioral:  Negative for depression. The patient is not nervous/anxious and does not have insomnia.     MEDICAL HISTORY:  Past Medical History:  Diagnosis Date   Allergic state    Anxiety    Arthritis    Barrett's esophagus    Breast cancer (HCC)    Left   Chronic cough    COPD (chronic obstructive pulmonary disease) (HCC)    Depression    Duodenitis    Emphysema of lung (HCC)    Esophageal motility disorder    Family history of adrenal cancer    Family history of brain cancer    Family history of colon cancer    Family history of lung cancer    Family history of melanoma    Gastritis    GERD (gastroesophageal reflux disease)    Headache    migraines - 3-4x/yr   Osteoporosis, post-menopausal    Pre-diabetes    Tobacco abuse disorder    Varicose veins of legs     SURGICAL HISTORY: Past Surgical History:  Procedure Laterality Date   APPENDECTOMY     BREAST BIOPSY Left 01/02/2021   [place] WITH FEATURES SUGGESTIVE OF SOLID PSEUDOPAPILLARY CARCINOMA   BREAST LUMPECTOMY,RADIO FREQ LOCALIZER,AXILLARY SENTINEL LYMPH NODE BIOPSY Left 01/28/2021   Procedure: BREAST LUMPECTOMY,RADIO FREQ LOCALIZER,AXILLARY SENTINEL LYMPH NODE BIOPSY;  Surgeon: Herbert Pun, MD;  Location: [facility];  Service: General;  Laterality: Left;   CATARACT EXTRACTION W/PHACO Right 01/03/2017   Procedure: CATARACT EXTRACTION PHACO AND INTRAOCULAR LENS PLACEMENT ([place])  Right;  Surgeon: Ronnell Freshwater, MD;  Location: [place];  Service: Ophthalmology;  Laterality: Right;   CATARACT EXTRACTION W/PHACO Left 01/17/2017   Procedure: CATARACT EXTRACTION PHACO AND INTRAOCULAR LENS PLACEMENT ([facility]);  Surgeon: Ronnell Freshwater, MD;  Location: [place];   Service: Ophthalmology;  Laterality: Left;  left   CHOLECYSTECTOMY     COLONOSCOPY WITH PROPOFOL N/A 04/06/2018   Procedure: COLONOSCOPY WITH PROPOFOL;  Surgeon: Lollie Sails, MD;  Location: [facility] ENDOSCOPY;  Service: Endoscopy;  Laterality: N/A;   COLONOSCOPY WITH PROPOFOL N/A 06/08/2021   Procedure: COLONOSCOPY WITH PROPOFOL;  Surgeon: Tol[place], Benay Pike, MD;  Location: [facility] ENDOSCOPY;  Service: Gastroenterology;  Laterality: N/A;   ESOPHAGOGASTRODUODENOSCOPY N/A 09/26/2018   Procedure: ESOPHAGOGASTRODUODENOSCOPY (EGD);  Surgeon: Lollie Sails, MD;  Location: [facility] ENDOSCOPY;  Service: Endoscopy;  Laterality: N/A;   ESOPHAGOGASTRODUODENOSCOPY (EGD) WITH PROPOFOL N/A 10/14/2015   Procedure: ESOPHAGOGASTRODUODENOSCOPY (EGD) WITH PROPOFOL;  Surgeon: Lollie Sails, MD;  Location: [facility] ENDOSCOPY;  Service: Endoscopy;  Laterality: N/A;   EYE SURGERY Bilateral 2019   cataracts    FRACTURE SURGERY     HERNIA REPAIR      SOCIAL HISTORY: Social History   Socioeconomic History   Marital status: Widowed    Spouse name: Not on file   Number of children: Not on file   Years of education: Not on file   Highest education level: Not on file  Occupational History   Not on file  Tobacco Use   Smoking status: Every Day    Packs/day: 0.50    Years: 50.00    Pack years: 25.00    Types: Cigarettes  Smokeless tobacco: Never  Vaping Use   Vaping Use: Never used  Substance and Sexual Activity   Alcohol use: Yes    Alcohol/week: 1.0 standard drink    Types: 1 Glasses of wine per week    Comment: rare - Holidays   Drug use: No   Sexual activity: Not on file  Other Topics Concern   Not on file  Social History Narrative   Lives in [place]; 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 adrenal gland; lung.    Dementia Father    Cancer Sister        colon cancer- 48s to liver.    Cancer Maternal Grandmother        brain cancer   Diabetes Maternal Grandfather    Stroke Maternal Grandfather    Rectal cancer Daughter        at 10 years; survived.    Melanoma Grandson        dx 22 on neck   Breast cancer Neg Hx     ALLERGIES:  is allergic to tape, nicotine, and bacitracin.  MEDICATIONS:  Current Outpatient Medications  Medication Sig Dispense Refill   albuterol (VENTOLIN HFA) 108 (90 Base) MCG/ACT inhaler Inhale into the lungs every 6 (six) hours as needed for wheezing or shortness of breath.     ALPRAZolam (XANAX) 0.25 MG tablet Take 0.25 mg by mouth 2 (two) times daily as needed for anxiety.     anastrozole (ARIMIDEX) 1 MG tablet Take 1 tablet (1 mg total) by mouth daily. 90 tablet 1   buPROPion (WELLBUTRIN SR) 150 MG 12 hr tablet Take 150 mg by mouth daily.     cetirizine (ZYRTEC) 10 MG tablet Take 10 mg by mouth daily as needed for allergies.     clotrimazole-betamethasone (LOTRISONE) cream Apply 1 application topically 2 (two) times daily.     fluticasone (FLONASE) 50 MCG/ACT nasal spray Place 2 sprays into both nostrils daily as needed for allergies.     furosemide (LASIX) 20 MG tablet Take 20 mg by mouth as needed for fluid.     gabapentin (NEURONTIN) 100 MG capsule Take 100 mg by mouth 3 (three) times daily. Two capsules 3 times a day as needed     montelukast (SINGULAIR) 10 MG tablet Take 10 mg by mouth at bedtime.     omeprazole (PRILOSEC) 40 MG capsule Take 40 mg by mouth in the morning and at bedtime.     ondansetron (ZOFRAN) 4 MG tablet Take 4 mg by mouth 2 (two) times daily as needed for nausea/vomiting.  0   Polyethyl Glycol-Propyl Glycol (SYSTANE)  0.4-0.3 % SOLN Place 1-2 drops into both eyes 3 (three) times daily as needed (dry/irritated eyes).     Probiotic Product (PROBIOTIC ACIDOPHILUS BEADS) CAPS Take 1 capsule by mouth daily.     sertraline (ZOLOFT) 50 MG tablet Take 50 mg by mouth daily.     SPIRIVA HANDIHALER 18 MCG inhalation capsule Place 1 capsule into inhaler and inhale in the morning.  No current facility-administered medications for this visit.      Marland Kitchen  PHYSICAL EXAMINATION: ECOG PERFORMANCE STATUS: 0 - Asymptomatic  Vitals:   07/30/21 0953  BP: 136/83  Pulse: 86  Resp: 16  Temp: 98 F (36.7 C)   Filed Weights   07/30/21 0953  Weight: 145 lb (65.8 kg)    Physical Exam HENT:     Head: Normocephalic and atraumatic.     Mouth/Throat:     Pharynx: No oropharyngeal exudate.  Eyes:     Pupils: Pupils are equal, round, and reactive to light.  Cardiovascular:     Rate and Rhythm: Normal rate and regular rhythm.  Pulmonary:     Effort: Pulmonary effort is normal. No respiratory distress.     Breath sounds: Normal breath sounds. No wheezing.  Abdominal:     General: Bowel sounds are normal. There is no distension.     Palpations: Abdomen is soft. There is no mass.     Tenderness: There is no abdominal tenderness. There is no guarding or rebound.  Musculoskeletal:        General: No tenderness. Normal range of motion.     Cervical back: Normal range of motion and neck supple.  Skin:    General: Skin is warm.  Neurological:     Mental Status: She is alert and oriented to person, place, and time.  Psychiatric:        Mood and Affect: Affect normal.  I am going to provide you   LABORATORY DATA:  I have reviewed the data as listed Lab Results  Component Value Date   WBC 7.5 03/18/2021   HGB 15.9 (H) 03/18/2021   HCT 47.7 (H) 03/18/2021   MCV 93.0 03/18/2021   PLT 236 03/18/2021   Recent Labs    01/19/21 1153  NA 137  K 4.4  CL 99  CO2 27  GLUCOSE 97  BUN 17  CREATININE 0.97  CALCIUM  9.1  GFRNONAA >60  PROT 7.2  ALBUMIN 4.1  AST 13*  ALT 11  ALKPHOS 59  BILITOT 0.6    RADIOGRAPHIC STUDIES: I have personally reviewed the radiological images as listed and agreed with the findings in the report. No results found.   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 S/p RT [finished June 1st week, 2022].  Currently on anastrozole.   # Tolerating well- continue  anastrozole for total of 5 years.  # Osteoporosis : BMD T score -2.6 [July 2022]-. Discussed the potential risk factors for osteoporosis- age/gender/postmenopausal status/use of anti-estrogen treatments. Discussed multiple options including exercise/ calcium and vitamin D supplementation/ and also use of bisphosphonates/parenteral bisphosphonate/denosumab.  For now recommend conservative measures calcium plus vitamin D.  # Erythrocytosis Hb15.9--likely secondary/smoking.  Monitor for now.  Do not suspect any primary causes.  # Smoking: Active smoker; counseled to quit smoking.  Discussed regarding lung cancer screening program at length; which includes low-dose CT scan on annual basis for 5 years. [Dr.Fleming re: LCSP]- awaiting LDCT- in dec 2022.   # DISPOSITION:  # follow up in 4 months- MD; no labs- Dr.B  All questions were answered. The patient/family knows to call the clinic with any problems, questions or concerns.    Cammie Sickle, MD 07/30/2021 10:37 AM

## 2021-07-30 NOTE — Assessment & Plan Note (Addendum)
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 S/p RT [finished June 1st week, 2022].  Currently on anastrozole.   # Tolerating well- continue  anastrozole for total of 5 years.  # Osteoporosis : BMD T score -2.6 [July 2022]-. Discussed the potential risk factors for osteoporosis- age/gender/postmenopausal status/use of anti-estrogen treatments. Discussed multiple options including exercise/ calcium and vitamin D supplementation/ and also use of bisphosphonates/parenteral bisphosphonate/denosumab.  For now recommend conservative measures calcium plus vitamin D.  # Erythrocytosis Hb15.9--likely secondary/smoking.  Monitor for now.  Do not suspect any primary causes.  # Smoking: Active smoker; counseled to quit smoking.  Discussed regarding lung cancer screening program at length; which includes low-dose CT scan on annual basis for 5 years. [Dr.Fleming re: LCSP]- awaiting LDCT- in dec 2022.   # DISPOSITION:  # follow up in 4 months- MD; no labs- Dr.B

## 2021-07-30 NOTE — Progress Notes (Signed)
Patient here for follow up no concerns today. 

## 2021-09-02 ENCOUNTER — Other Ambulatory Visit: Payer: Self-pay | Admitting: General Surgery

## 2021-09-02 DIAGNOSIS — Z1231 Encounter for screening mammogram for malignant neoplasm of breast: Secondary | ICD-10-CM

## 2021-09-02 DIAGNOSIS — Z853 Personal history of malignant neoplasm of breast: Secondary | ICD-10-CM

## 2021-10-01 DIAGNOSIS — G2581 Restless legs syndrome: Secondary | ICD-10-CM | POA: Diagnosis not present

## 2021-10-01 DIAGNOSIS — J449 Chronic obstructive pulmonary disease, unspecified: Secondary | ICD-10-CM | POA: Diagnosis not present

## 2021-10-01 DIAGNOSIS — L299 Pruritus, unspecified: Secondary | ICD-10-CM | POA: Diagnosis not present

## 2021-10-01 DIAGNOSIS — F3341 Major depressive disorder, recurrent, in partial remission: Secondary | ICD-10-CM | POA: Diagnosis not present

## 2021-10-01 DIAGNOSIS — Z720 Tobacco use: Secondary | ICD-10-CM | POA: Diagnosis not present

## 2021-10-01 DIAGNOSIS — E785 Hyperlipidemia, unspecified: Secondary | ICD-10-CM | POA: Diagnosis not present

## 2021-10-01 DIAGNOSIS — R7309 Other abnormal glucose: Secondary | ICD-10-CM | POA: Diagnosis not present

## 2021-10-01 DIAGNOSIS — F17210 Nicotine dependence, cigarettes, uncomplicated: Secondary | ICD-10-CM | POA: Diagnosis not present

## 2021-10-01 DIAGNOSIS — R0609 Other forms of dyspnea: Secondary | ICD-10-CM | POA: Diagnosis not present

## 2021-10-01 DIAGNOSIS — C50012 Malignant neoplasm of nipple and areola, left female breast: Secondary | ICD-10-CM | POA: Diagnosis not present

## 2021-10-01 DIAGNOSIS — J3089 Other allergic rhinitis: Secondary | ICD-10-CM | POA: Diagnosis not present

## 2021-10-08 ENCOUNTER — Other Ambulatory Visit: Payer: Self-pay

## 2021-10-08 ENCOUNTER — Encounter: Payer: Self-pay | Admitting: Radiation Oncology

## 2021-10-08 ENCOUNTER — Ambulatory Visit
Admission: RE | Admit: 2021-10-08 | Discharge: 2021-10-08 | Disposition: A | Discharge status: Home / Self Care | Payer: Medicare HMO | Source: Ambulatory Visit | Attending: Radiation Oncology | Admitting: Radiation Oncology

## 2021-10-08 VITALS — BP 129/83 | HR 97 | Temp 96.0°F | Wt 149.2 lb

## 2021-10-08 DIAGNOSIS — Z853 Personal history of malignant neoplasm of breast: Secondary | ICD-10-CM | POA: Diagnosis not present

## 2021-10-08 DIAGNOSIS — C50912 Malignant neoplasm of unspecified site of left female breast: Secondary | ICD-10-CM | POA: Diagnosis not present

## 2021-10-08 DIAGNOSIS — Z923 Personal history of irradiation: Secondary | ICD-10-CM | POA: Insufficient documentation

## 2021-10-08 DIAGNOSIS — Z79811 Long term (current) use of aromatase inhibitors: Secondary | ICD-10-CM | POA: Diagnosis not present

## 2021-10-08 DIAGNOSIS — F17210 Nicotine dependence, cigarettes, uncomplicated: Secondary | ICD-10-CM | POA: Diagnosis not present

## 2021-10-08 DIAGNOSIS — G2581 Restless legs syndrome: Secondary | ICD-10-CM | POA: Diagnosis not present

## 2021-10-08 DIAGNOSIS — J449 Chronic obstructive pulmonary disease, unspecified: Secondary | ICD-10-CM | POA: Diagnosis not present

## 2021-10-08 DIAGNOSIS — R7303 Prediabetes: Secondary | ICD-10-CM | POA: Diagnosis not present

## 2021-10-08 DIAGNOSIS — J3089 Other allergic rhinitis: Secondary | ICD-10-CM | POA: Diagnosis not present

## 2021-10-08 DIAGNOSIS — C50812 Malignant neoplasm of overlapping sites of left female breast: Secondary | ICD-10-CM | POA: Diagnosis not present

## 2021-10-08 DIAGNOSIS — Z170 Estrogen receptor positive status [ER+]: Secondary | ICD-10-CM | POA: Diagnosis not present

## 2021-10-08 DIAGNOSIS — Z0000 Encounter for general adult medical examination without abnormal findings: Secondary | ICD-10-CM | POA: Diagnosis not present

## 2021-10-08 DIAGNOSIS — Z08 Encounter for follow-up examination after completed treatment for malignant neoplasm: Secondary | ICD-10-CM | POA: Diagnosis not present

## 2021-10-08 DIAGNOSIS — F329 Major depressive disorder, single episode, unspecified: Secondary | ICD-10-CM | POA: Diagnosis not present

## 2021-10-08 NOTE — Progress Notes (Signed)
Radiation Oncology Follow up Note  Name: Morgan Valdez   Date:   10/08/2021 MRN:  [id number] DOB: [birth year]    This 75 y.o. female presents to the clinic today for 79-month follow-up status post whole breast radiation to her left breast for stage Ia ER/PR positive invasive mammary carcinoma.  REFERRING PROVIDER: Tracie Harrier, MD  HPI: Patient is a 75 year old female now at 6 months having completed whole breast radiation to her left breast for stage Ia ER/PR positive base of mammary carcinoma.  Seen today in routine follow-up she is doing well she has had a little bit of enlargement of her left breast which is unusual most likely related to a seroma.  She otherwise specifically denies breast tenderness cough or bone pain..  She is scheduled for mammograms next week which I will review when they become available.  She is currently on Arimidex tolerant at well without side effect.  COMPLICATIONS OF TREATMENT: none  FOLLOW UP COMPLIANCE: keeps appointments   PHYSICAL EXAM:  BP 129/83   Pulse 97   Temp (!) 96 F (35.6 C) (Tympanic)   Wt 149 lb 3.2 oz (67.7 kg)   BMI 23.37 kg/m  Lungs are clear to A&P cardiac examination essentially unremarkable with regular rate and rhythm. No dominant mass or nodularity is noted in either breast in 2 positions examined. Incision is well-healed. No axillary or supraclavicular adenopathy is appreciated.  There is slight enlargement of her left breast although cosmetic result is still excellent. Well-developed well-nourished patient in NAD. HEENT reveals PERLA, EOMI, discs not visualized.  Oral cavity is clear. No oral mucosal lesions are identified. Neck is clear without evidence of cervical or supraclavicular adenopathy. Lungs are clear to A&P. Cardiac examination is essentially unremarkable with regular rate and rhythm without murmur rub or thrill. Abdomen is benign with no organomegaly or masses noted. Motor sensory and DTR levels are equal and  symmetric in the upper and lower extremities. Cranial nerves II through XII are grossly intact. Proprioception is intact. No peripheral adenopathy or edema is identified. No motor or sensory levels are noted. Crude visual fields are within normal range.    RADIOLOGY RESULTS: Mammograms will be reviewed next week when they become available.  PLAN: Present time patient continues to do well with no evidence of disease now out 6 months.  Of asked to see her back in 6 months for follow-up.  She continues on Arimidex without side effect.  I will review her mammograms when they become available next week.  Patient knows to call with any concerns.  I would like to take this opportunity to thank you for allowing me to participate in the care of your patient.Noreene Filbert, MD

## 2021-10-19 ENCOUNTER — Ambulatory Visit
Admission: RE | Admit: 2021-10-19 | Discharge: 2021-10-19 | Disposition: A | Discharge status: Home / Self Care | Payer: Medicare HMO | Source: Ambulatory Visit | Attending: General Surgery | Admitting: General Surgery

## 2021-10-19 ENCOUNTER — Other Ambulatory Visit: Payer: Self-pay

## 2021-10-19 DIAGNOSIS — Z853 Personal history of malignant neoplasm of breast: Secondary | ICD-10-CM

## 2021-10-19 DIAGNOSIS — Z1231 Encounter for screening mammogram for malignant neoplasm of breast: Secondary | ICD-10-CM | POA: Insufficient documentation

## 2021-10-19 DIAGNOSIS — R922 Inconclusive mammogram: Secondary | ICD-10-CM | POA: Diagnosis not present

## 2021-10-29 DIAGNOSIS — C50412 Malignant neoplasm of upper-outer quadrant of left female breast: Secondary | ICD-10-CM | POA: Diagnosis not present

## 2021-10-29 DIAGNOSIS — Z170 Estrogen receptor positive status [ER+]: Secondary | ICD-10-CM | POA: Diagnosis not present

## 2021-11-20 DIAGNOSIS — Z720 Tobacco use: Secondary | ICD-10-CM | POA: Diagnosis not present

## 2021-11-20 DIAGNOSIS — J069 Acute upper respiratory infection, unspecified: Secondary | ICD-10-CM | POA: Diagnosis not present

## 2021-11-26 ENCOUNTER — Encounter: Payer: Self-pay | Admitting: Internal Medicine

## 2021-11-26 ENCOUNTER — Inpatient Hospital Stay: Payer: Medicare HMO | Attending: Internal Medicine | Admitting: Internal Medicine

## 2021-11-26 ENCOUNTER — Other Ambulatory Visit: Payer: Self-pay

## 2021-11-26 DIAGNOSIS — Z79899 Other long term (current) drug therapy: Secondary | ICD-10-CM | POA: Diagnosis not present

## 2021-11-26 DIAGNOSIS — C50812 Malignant neoplasm of overlapping sites of left female breast: Secondary | ICD-10-CM

## 2021-11-26 DIAGNOSIS — J069 Acute upper respiratory infection, unspecified: Secondary | ICD-10-CM | POA: Insufficient documentation

## 2021-11-26 DIAGNOSIS — Z170 Estrogen receptor positive status [ER+]: Secondary | ICD-10-CM | POA: Diagnosis not present

## 2021-11-26 DIAGNOSIS — F17210 Nicotine dependence, cigarettes, uncomplicated: Secondary | ICD-10-CM | POA: Diagnosis not present

## 2021-11-26 DIAGNOSIS — M810 Age-related osteoporosis without current pathological fracture: Secondary | ICD-10-CM | POA: Diagnosis not present

## 2021-11-26 DIAGNOSIS — D751 Secondary polycythemia: Secondary | ICD-10-CM | POA: Diagnosis not present

## 2021-11-26 DIAGNOSIS — Z79811 Long term (current) use of aromatase inhibitors: Secondary | ICD-10-CM | POA: Insufficient documentation

## 2021-11-26 NOTE — Assessment & Plan Note (Addendum)
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 S/p RT [finished June 1st week, 2022].  Currently on anastrozole; DEC 2022- mammo-WNL.   # Tolerating well- continue  anastrozole for total of 5 years.  # Osteoporosis : BMD T score -2.6 [July 2022]-.on calcium plus vitamin D- STABLE.  # URI- cough/runny nose [s/p urgent care]- on medications- improving.  Monitor closely no suspicion for any lower respiratory tract infections  # Erythrocytosis Hb15.9--likely secondary/smoking.  Monitor for now.  Do not suspect any primary causes.  # Smoking: Active smoker; counseled to quit smoking [< 1/2ppd-quitting].  Discussed regarding lung cancer screening program-which includes low-dose CT scan on annual basis for 5 years; reminded to speak to PCP. [Dr.Fleming re: LCSP].   # DISPOSITION:  # follow up in 4 months- MD;  labs-cbc/cmp-  Dr.B

## 2021-11-26 NOTE — Progress Notes (Signed)
[facility] NOTE  Patient Care Team: Tracie Harrier, MD as PCP - General (Internal Medicine) Theodore Demark, RN as Oncology Nurse Navigator Cammie Sickle, MD as Consulting Physician (Internal Medicine) Noreene Filbert, MD as Referring Physician (Radiation Oncology) Herbert Pun, MD as Consulting Physician (General Surgery)  CHIEF COMPLAINTS/PURPOSE OF CONSULTATION: Breast cancer  #  Oncology History Overview Note  #  BREAST, LEFT 2:00; ULTRASOUND-GUIDED BIOPSY:  - INVASIVE MAMMARY CARCINOMA WITH FEATURES SUGGESTIVE OF SOLID  PSEUDOPAPILLARY CARCINOMA; ER >90%; PR > 90%; he 2 Neu-NEG [Dr.Cinton]  PATHOLOGIC STAGE CLASSIFICATION (pTNM, AJCC 8th Edition):  TNM Descriptors: Not applicable  OH6G  Regional Lymph Nodes Modifier: sn  pN0  pM - Not applicable   SPECIAL STUDIES  Breast Biomarker Testing Performed on Previous Biopsy: [id number]  Estrogen Receptor (ER) Status: POSITIVE          Percentage of cells with nuclear positivity: Greater than 90%          Average intensity of staining: Strong   Progesterone Receptor (PgR) Status: POSITIVE          Percentage of cells with nuclear positivity: Greater than 90%          Average intensity of staining: Moderate   HER2 (by immunohistochemistry): NEGATIVE (Score 1+)  #Cancer screening : colonoscopy [since 56 years; again aug 2022];smoker;        Carcinoma of overlapping sites of left breast in female, estrogen receptor positive ([place])  01/19/2021 Initial Diagnosis   Carcinoma of overlapping sites of left breast in female, estrogen receptor positive ([place])   01/19/2021 Cancer Staging   Staging form: Breast, AJCC 8th Edition - Clinical: Stage IA (cT1a, cN0, cM0, G2, ER+, PR+, HER2-) - Signed by Cammie Sickle, MD on 01/19/2021 Histologic grading system: 3 grade system     Genetic Testing   Negative genetic testing. No pathogenic variants identified on the [facility] Multi-Cancer  Panel+RNA. The report date is 03/18/2021.  The Multi-Cancer Panel + RNA offered by [facility] includes sequencing and/or deletion duplication testing of the following 84 genes: AIP, ALK, APC, ATM, AXIN2,BAP1,  BARD1, BLM, BMPR1A, BRCA1, BRCA2, BRIP1, CASR, CDC73, CDH1, CDK4, CDKN1B, CDKN1C, CDKN2A (p14ARF), CDKN2A (p16INK4a), CEBPA, CHEK2, CTNNA1, DICER1, DIS3L2, EGFR (c.2369C>T, p.Thr790Met variant only), EPCAM (Deletion/duplication testing only), FH, FLCN, GATA2, GPC3, GREM1 (Promoter region deletion/duplication testing only), HOXB13 (c.251G>A, p.Gly84Glu), HRAS, KIT, MAX, MEN1, MET, MITF (c.952G>A, p.Glu318Lys variant only), MLH1, MSH2, MSH3, MSH6, MUTYH, NBN, NF1, NF2, NTHL1, PALB2, PDGFRA, PHOX2B, PMS2, POLD1, POLE, POT1, PRKAR1A, PTCH1, PTEN, RAD50, RAD51C, RAD51D, RB1, RECQL4, RET, RUNX1, SDHAF2, SDHA (sequence changes only), SDHB, SDHC, SDHD, SMAD4, SMARCA4, SMARCB1, SMARCE1, STK11, SUFU, TERC, TERT, TMEM127, TP53, TSC1, TSC2, VHL, WRN and WT1.      HISTORY OF PRESENTING ILLNESS: Ambulating independently.  Alone.  Morgan Valdez 76 y.o.  female stage I breast cancer ER/PR positive HER2 negative-anastrozole is here for follow-up.  Patient recently had episode of respite tract infection/nasal congestion-cough.  S/p evaluation in urgent care currently symptoms improving.   Denies any significant hot flashes.  Denies any worsening joint pains.  Review of Systems  Constitutional:  Negative for chills, diaphoresis, fever, malaise/fatigue and weight loss.  HENT:  Negative for nosebleeds and sore throat.   Eyes:  Negative for double vision.  Respiratory:  Negative for cough, hemoptysis, sputum production, shortness of breath and wheezing.   Cardiovascular:  Negative for chest pain, palpitations, orthopnea and leg swelling.  Gastrointestinal:  Negative for abdominal pain, blood in  stool, constipation, diarrhea, heartburn, melena, nausea and vomiting.  Genitourinary:  Negative for dysuria, frequency  and urgency.  Musculoskeletal:  Positive for back pain. Negative for joint pain.  Skin:  Positive for rash. Negative for itching.  Neurological:  Negative for dizziness, tingling, focal weakness, weakness and headaches.  Endo/Heme/Allergies:  Does not bruise/bleed easily.  Psychiatric/Behavioral:  Negative for depression. The patient is not nervous/anxious and does not have insomnia.     MEDICAL HISTORY:  Past Medical History:  Diagnosis Date   Allergic state    Anxiety    Arthritis    Barrett's esophagus    Breast cancer (HCC)    Left   Chronic cough    COPD (chronic obstructive pulmonary disease) (HCC)    Depression    Duodenitis    Emphysema of lung (HCC)    Esophageal motility disorder    Family history of adrenal cancer    Family history of brain cancer    Family history of colon cancer    Family history of lung cancer    Family history of melanoma    Gastritis    GERD (gastroesophageal reflux disease)    Headache    migraines - 3-4x/yr   Osteoporosis, post-menopausal    Personal history of radiation therapy 2022   breast cancer   Pre-diabetes    Tobacco abuse disorder    Varicose veins of legs     SURGICAL HISTORY: Past Surgical History:  Procedure Laterality Date   APPENDECTOMY     BREAST BIOPSY Left 01/02/2021   [facility] WITH FEATURES SUGGESTIVE OF SOLID PSEUDOPAPILLARY CARCINOMA   BREAST LUMPECTOMY Left 12/2020   SOLID PAPILLARY CARCINOMA, 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   BREAST LUMPECTOMY,RADIO FREQ LOCALIZER,AXILLARY SENTINEL LYMPH NODE BIOPSY Left 01/28/2021   Procedure: BREAST LUMPECTOMY,RADIO FREQ LOCALIZER,AXILLARY SENTINEL LYMPH NODE BIOPSY;  Surgeon: Herbert Pun, MD;  Location: [facility];  Service: General;  Laterality: Left;   CATARACT EXTRACTION W/PHACO Right 01/03/2017   Procedure: CATARACT EXTRACTION PHACO AND INTRAOCULAR LENS PLACEMENT ([place])  Right;  Surgeon: Ronnell Freshwater, MD;  Location: [place];  Service: Ophthalmology;   Laterality: Right;   CATARACT EXTRACTION W/PHACO Left 01/17/2017   Procedure: CATARACT EXTRACTION PHACO AND INTRAOCULAR LENS PLACEMENT (IOC);  Surgeon: Ronnell Freshwater, MD;  Location: [place];  Service: Ophthalmology;  Laterality: Left;  left   CHOLECYSTECTOMY     COLONOSCOPY WITH PROPOFOL N/A 04/06/2018   Procedure: COLONOSCOPY WITH PROPOFOL;  Surgeon: Lollie Sails, MD;  Location: [facility] ENDOSCOPY;  Service: Endoscopy;  Laterality: N/A;   COLONOSCOPY WITH PROPOFOL N/A 06/08/2021   Procedure: COLONOSCOPY WITH PROPOFOL;  Surgeon: Toledo, Benay Pike, MD;  Location: [facility] ENDOSCOPY;  Service: Gastroenterology;  Laterality: N/A;   ESOPHAGOGASTRODUODENOSCOPY N/A 09/26/2018   Procedure: ESOPHAGOGASTRODUODENOSCOPY (EGD);  Surgeon: Lollie Sails, MD;  Location[facility];  Service: Endoscopy;  Laterality: N/A;   ESOPHAGOGASTRODUODENOSCOPY (EGD) WITH PROPOFOL N/A 10/14/2015   Procedure: ESOPHAGOGASTRODUODENOSCOPY (EGD) WITH PROPOFOL;  Surgeon: Lollie Sails, MD;  Location: [facility] ENDOSCOPY;  Service: Endoscopy;  Laterality: N/A;   EYE SURGERY Bilateral 2019   cataracts    FRACTURE SURGERY     HERNIA REPAIR      SOCIAL HISTORY: Social History   Socioeconomic History   Marital status: Widowed    Spouse name: Not on file   Number of children: Not on file   Years of education: Not on file   Highest education level: Not on file  Occupational History   Not on file  Tobacco Use   Smoking status: Every Day    Packs/day: 0.50    Years: 50.00    Pack years: 25.00    Types: Cigarettes   Smokeless tobacco: Never  Vaping Use   Vaping Use: Never used  Substance and Sexual Activity   Alcohol use: Yes    Alcohol/week: 1.0 standard drink    Types: 1 Glasses of wine per week    Comment: rare - Holidays   Drug use: No   Sexual activity: Not on file  Other Topics Concern   Not on file  Social History Narrative   Lives in [place]; 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 adrenal gland; lung.    Dementia Father    Cancer Sister        colon cancer- 41s to liver.    Cancer Maternal Grandmother        brain cancer   Diabetes Maternal Grandfather    Stroke Maternal Grandfather    Rectal cancer Daughter        at 66 years; survived.    Melanoma Grandson        dx 22 on neck   Breast cancer Neg Hx     ALLERGIES:  is allergic to tape, nicotine, and bacitracin.  MEDICATIONS:  Current Outpatient Medications  Medication Sig Dispense Refill   ALPRAZolam (XANAX) 0.25 MG tablet Take 0.25 mg by mouth 2 (two) times daily as needed for anxiety.     anastrozole (ARIMIDEX) 1 MG tablet Take 1 tablet (1 mg total) by mouth daily. 90 tablet 1   buPROPion (WELLBUTRIN SR) 150 MG 12 hr tablet Take 150 mg by mouth daily.     cetirizine (ZYRTEC) 10 MG tablet Take 10 mg by mouth daily as needed for allergies.     clotrimazole-betamethasone (LOTRISONE) cream Apply 1 application topically 2 (two) times daily.     furosemide (LASIX) 20 MG tablet Take 20 mg by mouth as needed for fluid.     gabapentin (NEURONTIN) 100 MG capsule Take 100 mg by mouth 3 (three) times daily. Two capsules 3 times a day as needed     omeprazole (PRILOSEC) 40 MG capsule Take 40 mg by mouth in the morning and at bedtime.     ondansetron (ZOFRAN) 4 MG tablet Take 4 mg by mouth 2 (two) times daily as needed for nausea/vomiting.  0   Polyethyl Glycol-Propyl Glycol (SYSTANE) 0.4-0.3 % SOLN Place 1-2 drops into both eyes 3 (three) times daily as needed (dry/irritated eyes).     sertraline  (ZOLOFT) 50 MG tablet Take 50 mg by mouth daily.     SPIRIVA HANDIHALER 18 MCG inhalation capsule Place 1 capsule into inhaler and inhale in the morning.     albuterol (VENTOLIN HFA) 108 (90 Base) MCG/ACT inhaler Inhale into the lungs every 6 (six) hours as needed for wheezing or shortness of breath.     fluticasone (FLONASE) 50 MCG/ACT nasal spray Place 2 sprays into both nostrils daily as needed for allergies.  montelukast (SINGULAIR) 10 MG tablet Take 10 mg by mouth at bedtime.     Probiotic Product (PROBIOTIC ACIDOPHILUS BEADS) CAPS Take 1 capsule by mouth daily.     No current facility-administered medications for this visit.      Marland Kitchen  PHYSICAL EXAMINATION: ECOG PERFORMANCE STATUS: 0 - Asymptomatic  Vitals:   11/26/21 1012  BP: (!) 144/127  Pulse: 89  Temp: 98.4 F (36.9 C)  SpO2: 97%   Filed Weights   11/26/21 1012  Weight: 150 lb 6.4 oz (68.2 kg)    Physical Exam HENT:     Head: Normocephalic and atraumatic.     Mouth/Throat:     Pharynx: No oropharyngeal exudate.  Eyes:     Pupils: Pupils are equal, round, and reactive to light.  Cardiovascular:     Rate and Rhythm: Normal rate and regular rhythm.  Pulmonary:     Effort: Pulmonary effort is normal. No respiratory distress.     Breath sounds: Normal breath sounds. No wheezing.  Abdominal:     General: Bowel sounds are normal. There is no distension.     Palpations: Abdomen is soft. There is no mass.     Tenderness: There is no abdominal tenderness. There is no guarding or rebound.  Musculoskeletal:        General: No tenderness. Normal range of motion.     Cervical back: Normal range of motion and neck supple.  Skin:    General: Skin is warm.  Neurological:     Mental Status: She is alert and oriented to person, place, and time.  Psychiatric:        Mood and Affect: Affect normal.  I am going to provide you   LABORATORY DATA:  I have reviewed the data as listed Lab Results  Component Value Date    WBC 8.9 11/27/2021   HGB 16.2 (H) 11/27/2021   HCT 49.7 (H) 11/27/2021   MCV 96.1 11/27/2021   PLT 291 11/27/2021   Recent Labs    01/19/21 1153 11/27/21 1001  NA 137 137  K 4.4 4.2  CL 99 101  CO2 27 28  GLUCOSE 97 105*  BUN 17 14  CREATININE 0.97 0.96  CALCIUM 9.1 8.9  GFRNONAA >60 >60  PROT 7.2  --   ALBUMIN 4.1  --   AST 13*  --   ALT 11  --   ALKPHOS 59  --   BILITOT 0.6  --     RADIOGRAPHIC STUDIES: I have personally reviewed the radiological images as listed and agreed with the findings in the report. DG Chest 2 View  Result Date: 11/27/2021 CLINICAL DATA:  Chest pain. EXAM: CHEST - 2 VIEW COMPARISON:  05/02/2021 FINDINGS: Cardiac silhouette and mediastinal contours are within normal limits. Mild calcification is again seen within aortic arch. Moderate flattening of the diaphragms and hyperinflation. No pleural effusion pneumothorax. Moderate multilevel degenerative disc changes of the thoracic spine. IMPRESSION: No significant change from prior. Stable hyperinflation compatible with COPD. No acute lung process. Electronically Signed   By: Yvonne Kendall M.D.   On: 11/27/2021 10:26   CT Head Wo Contrast  Result Date: 11/27/2021 CLINICAL DATA:  Neuro deficit, acute, stroke suspected EXAM: CT HEAD WITHOUT CONTRAST TECHNIQUE: Contiguous axial images were obtained from the base of the skull through the vertex without intravenous contrast. RADIATION DOSE REDUCTION: This exam was performed according to the departmental dose-optimization program which includes automated exposure control, adjustment of the mA and/or kV according to patient  size and/or use of iterative reconstruction technique. COMPARISON:  None. FINDINGS: Brain: There is no acute intracranial hemorrhage, mass effect, or edema. Gray-white differentiation is preserved. There is no extra-axial fluid collection. Ventricles and sulci are within normal limits in size and configuration. Minimal patchy low-density in the  supratentorial white matter is nonspecific but may reflect minor chronic microvascular ischemic changes. Possible superimposed age-indeterminate infarct in the left corona radiata. Vascular: There is atherosclerotic calcification at the skull base. Skull: Calvarium is unremarkable. Sinuses/Orbits: No acute finding. Other: None. IMPRESSION: No acute intracranial hemorrhage. Minor chronic microvascular ischemic changes with possible age-indeterminate small vessel infarct of the left corona radiata. Electronically Signed   By: Macy Mis M.D.   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 S/p RT [finished June 1st week, 2022].  Currently on anastrozole; DEC 2022- mammo-WNL.   # Tolerating well- continue  anastrozole for total of 5 years.  # Osteoporosis : BMD T score -2.6 [July 2022]-.on calcium plus vitamin D- STABLE.  # URI- cough/runny nose [s/p urgent care]- on medications- improving.  Monitor closely no suspicion for any lower respiratory tract infections  # Erythrocytosis Hb15.9--likely secondary/smoking.  Monitor for now.  Do not suspect any primary causes.  # Smoking: Active smoker; counseled to quit smoking [< 1/2ppd-quitting].  Discussed regarding lung cancer screening program-which includes low-dose CT scan on annual basis for 5 years; reminded to speak to PCP. [Dr.Fleming re: LCSP].   # DISPOSITION:  # follow up in 4 months- MD;  labs-cbc/cmp-  Dr.B  All questions were answered. The patient/family knows to call the clinic with any problems, questions or concerns.    Cammie Sickle, MD 12/17/2021 6:07 PM

## 2021-11-26 NOTE — Progress Notes (Signed)
BP 144/127, she states she has had a headache for a week now.

## 2021-11-27 ENCOUNTER — Emergency Department: Payer: Medicare HMO

## 2021-11-27 ENCOUNTER — Emergency Department
Admission: EM | Admit: 2021-11-27 | Discharge: 2021-11-27 | Disposition: A | Discharge status: Home / Self Care | Payer: Medicare HMO | Attending: Emergency Medicine | Admitting: Emergency Medicine

## 2021-11-27 ENCOUNTER — Other Ambulatory Visit: Payer: Self-pay

## 2021-11-27 DIAGNOSIS — I10 Essential (primary) hypertension: Secondary | ICD-10-CM | POA: Diagnosis not present

## 2021-11-27 DIAGNOSIS — R202 Paresthesia of skin: Secondary | ICD-10-CM | POA: Insufficient documentation

## 2021-11-27 DIAGNOSIS — R200 Anesthesia of skin: Secondary | ICD-10-CM

## 2021-11-27 DIAGNOSIS — J449 Chronic obstructive pulmonary disease, unspecified: Secondary | ICD-10-CM | POA: Diagnosis not present

## 2021-11-27 DIAGNOSIS — Z853 Personal history of malignant neoplasm of breast: Secondary | ICD-10-CM | POA: Insufficient documentation

## 2021-11-27 DIAGNOSIS — R079 Chest pain, unspecified: Secondary | ICD-10-CM | POA: Diagnosis not present

## 2021-11-27 DIAGNOSIS — R29818 Other symptoms and signs involving the nervous system: Secondary | ICD-10-CM | POA: Diagnosis not present

## 2021-11-27 DIAGNOSIS — I6782 Cerebral ischemia: Secondary | ICD-10-CM | POA: Diagnosis not present

## 2021-11-27 LAB — CBC
HCT: 49.7 % — ABNORMAL HIGH (ref 36.0–46.0)
Hemoglobin: 16.2 g/dL — ABNORMAL HIGH (ref 12.0–15.0)
MCH: 31.3 pg (ref 26.0–34.0)
MCHC: 32.6 g/dL (ref 30.0–36.0)
MCV: 96.1 fL (ref 80.0–100.0)
Platelets: 291 10*3/uL (ref 150–400)
RBC: 5.17 MIL/uL — ABNORMAL HIGH (ref 3.87–5.11)
RDW: 13.1 % (ref 11.5–15.5)
WBC: 8.9 10*3/uL (ref 4.0–10.5)
nRBC: 0 % (ref 0.0–0.2)

## 2021-11-27 LAB — BASIC METABOLIC PANEL
Anion gap: 8 (ref 5–15)
BUN: 14 mg/dL (ref 8–23)
CO2: 28 mmol/L (ref 22–32)
Calcium: 8.9 mg/dL (ref 8.9–10.3)
Chloride: 101 mmol/L (ref 98–111)
Creatinine, Ser: 0.96 mg/dL (ref 0.44–1.00)
GFR, Estimated: >60 mL/min (ref >60)
Glucose, Bld: 105 mg/dL — ABNORMAL HIGH (ref 70–99)
Potassium: 4.2 mmol/L (ref 3.5–5.1)
Sodium: 137 mmol/L (ref 135–145)

## 2021-11-27 LAB — TROPONIN I (HIGH SENSITIVITY): Troponin I (High Sensitivity): <2 ng/L (ref <18)

## 2021-11-27 NOTE — ED Triage Notes (Signed)
Pt reports HTN for the past week since having a cold. No hx. When asked, also reports chest tightness. NAD noted. RR even and unlabored.

## 2021-11-27 NOTE — ED Triage Notes (Signed)
Pt also c/o numbness to left hand that started around 0830 this am. States unsure if anxiety. Able to move all extremities. Clear speech.  Confirmed with Dr Joni Fears, no code stroke, no head CT at this time

## 2021-11-27 NOTE — ED Notes (Signed)
This RN at bedside to collect 2nd trop. Pt refusing and stated "the doctor told me everything looked good and that I can go home". Pt adamant she wants to leave as her ride is here. EDP [place] notified via secure chat.

## 2021-11-27 NOTE — ED Provider Notes (Signed)
[facility] Provider Note    Event Date/Time   First MD Initiated Contact with Patient 11/27/21 1036     (approximate)   History   Chief Complaint Hypertension   HPI  Morgan Valdez is a 76 y.o. female with past medical history of COPD, GERD, and breast cancer presents to the ED complaining of hypertension.  Patient reports that about 1 week ago she was seen at urgent care for upper respiratory infection, was told her blood pressure was elevated at that time.  She denies any history of hypertension and does not take any medication for it.  She since followed up with her oncologist yesterday for routine follow-up, was again told her blood pressure was high.  Earlier this morning, she called her PCPs office about this, was subsequently told to come to the ED for evaluation.  She reports left-sided throbbing headache ongoing since her URI diagnosis, not as severe as prior migraines.  She denies any fevers, cough, chest pain, or shortness of breath.  She does report an episode of numbness affecting her left arm earlier today starting around 830 and resolving after about 45 minutes.  She has not had any weakness in any of her extremities and denies any numbness in any other extremities.     Physical Exam   Triage Vital Signs: ED Triage Vitals  Enc Vitals Group     BP 11/27/21 0959 (!) 145/119     Pulse Rate 11/27/21 0959 89     Resp 11/27/21 0959 20     Temp 11/27/21 0959 98.3 F (36.8 C)     Temp src --      SpO2 11/27/21 0959 98 %     Weight --      Height --      Head Circumference --      Peak Flow --      Pain Score 11/27/21 1002 0     Pain Loc --      Pain Edu? --      Excl. in [place]? --     Most recent vital signs: Vitals:   11/27/21 1210 11/27/21 1211  BP:  (!) 144/83  Pulse: 81   Resp:    Temp:    SpO2: 100%     Constitutional: Alert and oriented. Eyes: Conjunctivae are normal. Head: Atraumatic. Nose: No  congestion/rhinnorhea. Mouth/Throat: Mucous membranes are moist.  Cardiovascular: Normal rate, regular rhythm. Grossly normal heart sounds.  2+ radial pulses bilaterally. Respiratory: Normal respiratory effort.  No retractions. Lungs CTAB. Gastrointestinal: Soft and nontender. No distention. Musculoskeletal: No lower extremity tenderness nor edema.  Neurologic:  Normal speech and language. No gross focal neurologic deficits are appreciated.    ED Results / Procedures / Treatments   Labs (all labs ordered are listed, but only abnormal results are displayed) Labs Reviewed  BASIC METABOLIC PANEL - Abnormal; Notable for the following components:      Result Value   Glucose, Bld 105 (*)    All other components within normal limits  CBC - Abnormal; Notable for the following components:   RBC 5.17 (*)    Hemoglobin 16.2 (*)    HCT 49.7 (*)    All other components within normal limits  TROPONIN I (HIGH SENSITIVITY)     EKG  ED ECG REPORT I, Blake Divine, the attending physician, personally viewed and interpreted this ECG.   Date: 11/27/2021  EKG Time: 10:11  Rate: 87  Rhythm: normal sinus rhythm  Axis: Normal  Intervals:none  ST&T Change: None  RADIOLOGY Chest x-ray reviewed by me with no infiltrate, edema, or effusion.  PROCEDURES:  Critical Care performed: No  Procedures   MEDICATIONS ORDERED IN ED: Medications - No data to display   IMPRESSION / MDM / [place] / ED COURSE  I reviewed the triage vital signs and the nursing notes.                              76 y.o. female with past medical history of COPD, GERD, and breast cancer who presents to the ED complaining of elevated blood pressure for about the past week associated with left-sided headache and episode of left arm numbness earlier today.  Differential diagnosis includes, but is not limited to, stroke, TIA, ACS, AKI, electrolyte abnormality, essential hypertension.  Patient  nontoxic-appearing and in no acute distress, vitals remarkable for mildly elevated blood pressure but otherwise reassuring.  Patient has no focal neurologic deficits on on exam, left arm numbness has resolved and she has no motor symptoms.  I doubt stroke given nonfocal exam and low suspicion for TIA given isolated sensory symptoms lasting less than 1 hour, ABCD score noted to be 3, placing patient in low risk category.  EKG shows no evidence of arrhythmia or ischemia and troponin is negative.  CBC and BMP show no anemia or electrolyte abnormality.  Chest x-ray is also unremarkable, we will screen CT head and if this is negative, patient would be appropriate for outpatient follow-up with her PCP for recheck of blood pressure.  CT head is remarkable for possible small vessel infarct in the left corona radiata that is age-indeterminate.  This is unlikely to be contributing to patient's presentation today given numbness was on the left and patient has no ongoing symptoms.  This is likely an unrelated chronic finding and further imaging is not indicated at this time.  Patient is appropriate for discharge home with PCP follow-up for recheck of her blood pressure.  She was counseled to return to the ED for new worsening symptoms, patient agrees with plan.       FINAL CLINICAL IMPRESSION(S) / ED DIAGNOSES   Final diagnoses:  Hypertension, unspecified type  Left arm numbness     Rx / DC Orders   ED Discharge Orders     None        Note:  This document was prepared using Dragon voice recognition software and may include unintentional dictation errors.   Blake Divine, MD 11/27/21 1227

## 2021-11-27 NOTE — ED Notes (Signed)
Pt declined offer of pain med for head. EDP [place] further updating pt currently.

## 2021-12-01 DIAGNOSIS — Z09 Encounter for follow-up examination after completed treatment for conditions other than malignant neoplasm: Secondary | ICD-10-CM | POA: Diagnosis not present

## 2021-12-01 DIAGNOSIS — R030 Elevated blood-pressure reading, without diagnosis of hypertension: Secondary | ICD-10-CM | POA: Diagnosis not present

## 2021-12-03 ENCOUNTER — Other Ambulatory Visit: Payer: Self-pay | Admitting: Specialist

## 2021-12-03 DIAGNOSIS — C50012 Malignant neoplasm of nipple and areola, left female breast: Secondary | ICD-10-CM

## 2021-12-03 DIAGNOSIS — R0609 Other forms of dyspnea: Secondary | ICD-10-CM

## 2021-12-18 ENCOUNTER — Ambulatory Visit: Payer: Medicare HMO

## 2021-12-22 ENCOUNTER — Other Ambulatory Visit: Payer: Self-pay | Admitting: *Deleted

## 2021-12-22 MED ORDER — ANASTROZOLE 1 MG PO TABS: 1.0000 mg | ORAL_TABLET | Freq: Every day | ORAL | 1 refills | Status: DC

## 2022-01-04 ENCOUNTER — Other Ambulatory Visit: Payer: Self-pay

## 2022-01-04 ENCOUNTER — Ambulatory Visit
Admission: RE | Admit: 2022-01-04 | Discharge: 2022-01-04 | Disposition: A | Discharge status: Home / Self Care | Payer: Medicare HMO | Source: Ambulatory Visit | Attending: Specialist | Admitting: Specialist

## 2022-01-04 DIAGNOSIS — I700 Atherosclerosis of aorta: Secondary | ICD-10-CM | POA: Diagnosis not present

## 2022-01-04 DIAGNOSIS — R0609 Other forms of dyspnea: Secondary | ICD-10-CM

## 2022-01-04 DIAGNOSIS — C50012 Malignant neoplasm of nipple and areola, left female breast: Secondary | ICD-10-CM | POA: Insufficient documentation

## 2022-01-04 DIAGNOSIS — J439 Emphysema, unspecified: Secondary | ICD-10-CM | POA: Diagnosis not present

## 2022-01-05 ENCOUNTER — Encounter: Payer: Self-pay | Admitting: Internal Medicine

## 2022-02-01 DIAGNOSIS — F3341 Major depressive disorder, recurrent, in partial remission: Secondary | ICD-10-CM | POA: Diagnosis not present

## 2022-02-01 DIAGNOSIS — Z170 Estrogen receptor positive status [ER+]: Secondary | ICD-10-CM | POA: Diagnosis not present

## 2022-02-01 DIAGNOSIS — E785 Hyperlipidemia, unspecified: Secondary | ICD-10-CM | POA: Diagnosis not present

## 2022-02-01 DIAGNOSIS — R7309 Other abnormal glucose: Secondary | ICD-10-CM | POA: Diagnosis not present

## 2022-02-01 DIAGNOSIS — C50812 Malignant neoplasm of overlapping sites of left female breast: Secondary | ICD-10-CM | POA: Diagnosis not present

## 2022-02-01 DIAGNOSIS — Z720 Tobacco use: Secondary | ICD-10-CM | POA: Diagnosis not present

## 2022-02-01 DIAGNOSIS — J3089 Other allergic rhinitis: Secondary | ICD-10-CM | POA: Diagnosis not present

## 2022-02-01 DIAGNOSIS — J449 Chronic obstructive pulmonary disease, unspecified: Secondary | ICD-10-CM | POA: Diagnosis not present

## 2022-02-01 DIAGNOSIS — Z0000 Encounter for general adult medical examination without abnormal findings: Secondary | ICD-10-CM | POA: Diagnosis not present

## 2022-02-01 DIAGNOSIS — G2581 Restless legs syndrome: Secondary | ICD-10-CM | POA: Diagnosis not present

## 2022-02-09 DIAGNOSIS — J3089 Other allergic rhinitis: Secondary | ICD-10-CM | POA: Diagnosis not present

## 2022-02-09 DIAGNOSIS — G2581 Restless legs syndrome: Secondary | ICD-10-CM | POA: Diagnosis not present

## 2022-02-09 DIAGNOSIS — Z0000 Encounter for general adult medical examination without abnormal findings: Secondary | ICD-10-CM | POA: Diagnosis not present

## 2022-02-09 DIAGNOSIS — F329 Major depressive disorder, single episode, unspecified: Secondary | ICD-10-CM | POA: Diagnosis not present

## 2022-02-09 DIAGNOSIS — J439 Emphysema, unspecified: Secondary | ICD-10-CM | POA: Diagnosis not present

## 2022-02-09 DIAGNOSIS — R7303 Prediabetes: Secondary | ICD-10-CM | POA: Diagnosis not present

## 2022-02-09 DIAGNOSIS — C50912 Malignant neoplasm of unspecified site of left female breast: Secondary | ICD-10-CM | POA: Diagnosis not present

## 2022-02-09 DIAGNOSIS — K581 Irritable bowel syndrome with constipation: Secondary | ICD-10-CM | POA: Diagnosis not present

## 2022-02-09 DIAGNOSIS — F17210 Nicotine dependence, cigarettes, uncomplicated: Secondary | ICD-10-CM | POA: Diagnosis not present

## 2022-02-22 DIAGNOSIS — L255 Unspecified contact dermatitis due to plants, except food: Secondary | ICD-10-CM | POA: Diagnosis not present

## 2022-03-04 DIAGNOSIS — R918 Other nonspecific abnormal finding of lung field: Secondary | ICD-10-CM | POA: Diagnosis not present

## 2022-03-04 DIAGNOSIS — R0609 Other forms of dyspnea: Secondary | ICD-10-CM | POA: Diagnosis not present

## 2022-03-04 DIAGNOSIS — J449 Chronic obstructive pulmonary disease, unspecified: Secondary | ICD-10-CM | POA: Diagnosis not present

## 2022-03-04 DIAGNOSIS — L237 Allergic contact dermatitis due to plants, except food: Secondary | ICD-10-CM | POA: Diagnosis not present

## 2022-03-05 DIAGNOSIS — R21 Rash and other nonspecific skin eruption: Secondary | ICD-10-CM | POA: Diagnosis not present

## 2022-03-25 ENCOUNTER — Inpatient Hospital Stay: Payer: Medicare HMO | Attending: Internal Medicine

## 2022-03-25 ENCOUNTER — Encounter: Payer: Self-pay | Admitting: Internal Medicine

## 2022-03-25 ENCOUNTER — Inpatient Hospital Stay (HOSPITAL_BASED_OUTPATIENT_CLINIC_OR_DEPARTMENT_OTHER): Payer: Medicare HMO | Admitting: Internal Medicine

## 2022-03-25 VITALS — BP 134/76 | HR 91 | Temp 98.0°F | Resp 18 | Wt 152.4 lb

## 2022-03-25 DIAGNOSIS — D751 Secondary polycythemia: Secondary | ICD-10-CM | POA: Diagnosis not present

## 2022-03-25 DIAGNOSIS — C50812 Malignant neoplasm of overlapping sites of left female breast: Secondary | ICD-10-CM | POA: Insufficient documentation

## 2022-03-25 DIAGNOSIS — F17210 Nicotine dependence, cigarettes, uncomplicated: Secondary | ICD-10-CM | POA: Diagnosis not present

## 2022-03-25 DIAGNOSIS — Z79811 Long term (current) use of aromatase inhibitors: Secondary | ICD-10-CM | POA: Insufficient documentation

## 2022-03-25 DIAGNOSIS — Z79899 Other long term (current) drug therapy: Secondary | ICD-10-CM | POA: Insufficient documentation

## 2022-03-25 DIAGNOSIS — Z170 Estrogen receptor positive status [ER+]: Secondary | ICD-10-CM | POA: Diagnosis not present

## 2022-03-25 LAB — COMPREHENSIVE METABOLIC PANEL
ALT: 13 U/L (ref 0–44)
AST: 17 U/L (ref 15–41)
Albumin: 3.9 g/dL (ref 3.5–5.0)
Alkaline Phosphatase: 68 U/L (ref 38–126)
Anion gap: 7 (ref 5–15)
BUN: 17 mg/dL (ref 8–23)
CO2: 28 mmol/L (ref 22–32)
Calcium: 8.9 mg/dL (ref 8.9–10.3)
Chloride: 104 mmol/L (ref 98–111)
Creatinine, Ser: 0.9 mg/dL (ref 0.44–1.00)
GFR, Estimated: >60 mL/min (ref >60)
Glucose, Bld: 98 mg/dL (ref 70–99)
Potassium: 4.1 mmol/L (ref 3.5–5.1)
Sodium: 139 mmol/L (ref 135–145)
Total Bilirubin: 0.7 mg/dL (ref 0.3–1.2)
Total Protein: 7.1 g/dL (ref 6.5–8.1)

## 2022-03-25 LAB — CBC WITH DIFFERENTIAL/PLATELET
Abs Immature Granulocytes: 0.02 10*3/uL (ref 0.00–0.07)
Basophils Absolute: 0 10*3/uL (ref 0.0–0.1)
Basophils Relative: 0 %
Eosinophils Absolute: 0.1 10*3/uL (ref 0.0–0.5)
Eosinophils Relative: 2 %
HCT: 47.2 % — ABNORMAL HIGH (ref 36.0–46.0)
Hemoglobin: 15.5 g/dL — ABNORMAL HIGH (ref 12.0–15.0)
Immature Granulocytes: 0 %
Lymphocytes Relative: 28 %
Lymphs Abs: 2 10*3/uL (ref 0.7–4.0)
MCH: 30.8 pg (ref 26.0–34.0)
MCHC: 32.8 g/dL (ref 30.0–36.0)
MCV: 93.7 fL (ref 80.0–100.0)
Monocytes Absolute: 0.6 10*3/uL (ref 0.1–1.0)
Monocytes Relative: 9 %
Neutro Abs: 4.2 10*3/uL (ref 1.7–7.7)
Neutrophils Relative %: 61 %
Platelets: 281 10*3/uL (ref 150–400)
RBC: 5.04 MIL/uL (ref 3.87–5.11)
RDW: 13.4 % (ref 11.5–15.5)
WBC: 6.9 10*3/uL (ref 4.0–10.5)
nRBC: 0 % (ref 0.0–0.2)

## 2022-03-25 NOTE — Assessment & Plan Note (Addendum)
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 S/p RT [finished June 1st week, 2022].  Currently on anastrozole; [Dr.Diaz] DEC 2022 BIL- mammo-WNL.  # Tolerating well- continue  anastrozole for total of 5 years [summer 2027].  # Post Lumpectomy- ? Neuropathy- STABLE.   # Osteoporosis : BMD T score -2.6 [July 2022]-.on calcium plus vitamin D- STABLE; will order BMD next visit.   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  Monitor for now.  Do not suspect any primary causes. STABLE.   # Smoking: Active smoker; counseled to quit smoking [< 1/2ppd-quitting]. On LDCT-March 2023 [Dr.Fleming]   # DISPOSITION:  # follow up in jan 2024- MD;  labs-cbc/cmp-  Dr.B

## 2022-03-25 NOTE — Progress Notes (Signed)
[facility] [place] NOTE  Patient Care Team: Tracie Harrier, MD as PCP - General (Internal Medicine) Theodore Demark, RN as Oncology Nurse Navigator Cammie Sickle, MD as Consulting Physician (Internal Medicine) Noreene Filbert, MD as Referring Physician (Radiation Oncology) Herbert Pun, MD as Consulting Physician (General Surgery)  CHIEF COMPLAINTS/PURPOSE OF CONSULTATION: Breast cancer  #  Oncology History Overview Note  #  BREAST, LEFT 2:00; ULTRASOUND-GUIDED BIOPSY:  - INVASIVE MAMMARY CARCINOMA WITH FEATURES SUGGESTIVE OF SOLID  PSEUDOPAPILLARY CARCINOMA; ER >90%; PR > 90%; he 2 Neu-NEG [Dr.Cinton]  PATHOLOGIC STAGE CLASSIFICATION (pTNM, AJCC 8th Edition):  TNM Descriptors: Not applicable  AQ7M  Regional Lymph Nodes Modifier: sn  pN0  pM - Not applicable   SPECIAL STUDIES  Breast Biomarker Testing Performed on Previous Biopsy: [id number]  Estrogen Receptor (ER) Status: POSITIVE          Percentage of cells with nuclear positivity: Greater than 90%          Average intensity of staining: Strong   Progesterone Receptor (PgR) Status: POSITIVE          Percentage of cells with nuclear positivity: Greater than 90%          Average intensity of staining: Moderate   HER2 (by immunohistochemistry): NEGATIVE (Score 1+)  #Cancer screening : colonoscopy [since 56 years; again aug 2022];smoker;        Carcinoma of overlapping sites of left breast in female, estrogen receptor positive ([place])  01/19/2021 Initial Diagnosis   Carcinoma of overlapping sites of left breast in female, estrogen receptor positive ([place])   01/19/2021 Cancer Staging   Staging form: Breast, AJCC 8th Edition - Clinical: Stage IA (cT1a, cN0, cM0, G2, ER+, PR+, HER2-) - Signed by Cammie Sickle, MD on 01/19/2021 Histologic grading system: 3 grade system     Genetic Testing   Negative genetic testing. No pathogenic variants identified on the [facility] Multi-Cancer  Panel+RNA. The report date is 03/18/2021.  The Multi-Cancer Panel + RNA offered by [facility] includes sequencing and/or deletion duplication testing of the following 84 genes: AIP, ALK, APC, ATM, AXIN2,BAP1,  BARD1, BLM, BMPR1A, BRCA1, BRCA2, BRIP1, CASR, CDC73, CDH1, CDK4, CDKN1B, CDKN1C, CDKN2A (p14ARF), CDKN2A (p16INK4a), CEBPA, CHEK2, CTNNA1, DICER1, DIS3L2, EGFR (c.2369C>T, p.Thr790Met variant only), EPCAM (Deletion/duplication testing only), FH, FLCN, GATA2, GPC3, GREM1 (Promoter region deletion/duplication testing only), HOXB13 (c.251G>A, p.Gly84Glu), HRAS, KIT, MAX, MEN1, MET, MITF (c.952G>A, p.Glu318Lys variant only), MLH1, MSH2, MSH3, MSH6, MUTYH, NBN, NF1, NF2, NTHL1, PALB2, PDGFRA, PHOX2B, PMS2, POLD1, POLE, POT1, PRKAR1A, PTCH1, PTEN, RAD50, RAD51C, RAD51D, RB1, RECQL4, RET, RUNX1, SDHAF2, SDHA (sequence changes only), SDHB, SDHC, SDHD, SMAD4, SMARCA4, SMARCB1, SMARCE1, STK11, SUFU, TERC, TERT, TMEM127, TP53, TSC1, TSC2, VHL, WRN and WT1.      HISTORY OF PRESENTING ILLNESS: Ambulating independently.  Alone.  Morgan Valdez 76 y.o.  female stage I breast cancer ER/PR positive HER2 negative-anastrozole is here for follow-up.  Pt states tenderness to the touch on her left side where her incision is near her breast.  Chronic intermittent.  Currently resolved.  Poison Ivy- rash- resolved.  Denies any significant hot flashes.  Denies any worsening joint pains.  Patient admits to compliance with her anastrozole.  Review of Systems  Constitutional:  Negative for chills, diaphoresis, fever, malaise/fatigue and weight loss.  HENT:  Negative for nosebleeds and sore throat.   Eyes:  Negative for double vision.  Respiratory:  Negative for cough, hemoptysis, sputum production, shortness of breath and wheezing.   Cardiovascular:  Negative  for chest pain, palpitations, orthopnea and leg swelling.  Gastrointestinal:  Negative for abdominal pain, blood in stool, constipation, diarrhea, heartburn,  melena, nausea and vomiting.  Genitourinary:  Negative for dysuria, frequency and urgency.  Musculoskeletal:  Positive for back pain. Negative for joint pain.  Skin:  Positive for rash. Negative for itching.  Neurological:  Negative for dizziness, tingling, focal weakness, weakness and headaches.  Endo/Heme/Allergies:  Does not bruise/bleed easily.  Psychiatric/Behavioral:  Negative for depression. The patient is not nervous/anxious and does not have insomnia.     MEDICAL HISTORY:  Past Medical History:  Diagnosis Date   Allergic state    Anxiety    Arthritis    Barrett's esophagus    Breast cancer (HCC)    Left   Chronic cough    COPD (chronic obstructive pulmonary disease) (HCC)    Depression    Duodenitis    Emphysema of lung (HCC)    Esophageal motility disorder    Family history of adrenal cancer    Family history of brain cancer    Family history of colon cancer    Family history of lung cancer    Family history of melanoma    Gastritis    GERD (gastroesophageal reflux disease)    Headache    migraines - 3-4x/yr   Osteoporosis, post-menopausal    Personal history of radiation therapy 2022   breast cancer   Pre-diabetes    Tobacco abuse disorder    Varicose veins of legs     SURGICAL HISTORY: Past Surgical History:  Procedure Laterality Date   APPENDECTOMY     BREAST BIOPSY Left 01/02/2021   [facility] WITH FEATURES SUGGESTIVE OF SOLID PSEUDOPAPILLARY CARCINOMA   BREAST LUMPECTOMY Left 12/2020   SOLID PAPILLARY CARCINOMA, 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   BREAST LUMPECTOMY,RADIO FREQ LOCALIZER,AXILLARY SENTINEL LYMPH NODE BIOPSY Left 01/28/2021   Procedure: BREAST LUMPECTOMY,RADIO FREQ LOCALIZER,AXILLARY SENTINEL LYMPH NODE BIOPSY;  Surgeon: Herbert Pun, MD;  Location: [facility];  Service: General;  Laterality: Left;   CATARACT EXTRACTION W/PHACO Right 01/03/2017   Procedure: CATARACT EXTRACTION PHACO AND INTRAOCULAR LENS PLACEMENT ([place])  Right;  Surgeon: Ronnell Freshwater, MD;  Location: [place];  Service: Ophthalmology;  Laterality: Right;   CATARACT EXTRACTION W/PHACO Left 01/17/2017   Procedure: CATARACT EXTRACTION PHACO AND INTRAOCULAR LENS PLACEMENT (I[facility]);  Surgeon: Ronnell Freshwater, MD;  Location: [place];  Service: Ophthalmology;  Laterality: Left;  left   CHOLECYSTECTOMY     COLONOSCOPY WITH PROPOFOL N/A 04/06/2018   Procedure: COLONOSCOPY WITH PROPOFOL;  Surgeon: Lollie Sails, MD;  Location: [facility] ENDOSCOPY;  Service: Endoscopy;  Laterality: N/A;   COLONOSCOPY WITH PROPOFOL N/A 06/08/2021   Procedure: COLONOSCOPY WITH PROPOFOL;  Surgeon: Toledo, Benay Pike, MD;  Location: [facility] ENDOSCOPY;  Service: Gastroenterology;  Laterality: N/A;   ESOPHAGOGASTRODUODENOSCOPY N/A 09/26/2018   Procedure: ESOPHAGOGASTRODUODENOSCOPY (EGD);  Surgeon: Lollie Sails, MD;  Location[facility] ENDOSCOPY;  Service: Endoscopy;  Laterality: N/A;   ESOPHAGOGASTRODUODENOSCOPY (EGD) WITH PROPOFOL N/A 10/14/2015   Procedure: ESOPHAGOGASTRODUODENOSCOPY (EGD) WITH PROPOFOL;  Surgeon: Lollie Sails, MD;  Location: [facility] ENDOSCOPY;  Service: Endoscopy;  Laterality: N/A;   EYE SURGERY Bilateral 2019   cataracts    FRACTURE SURGERY     HERNIA REPAIR      SOCIAL HISTORY: Social History   Socioeconomic History   Marital status: Widowed    Spouse name: Not on file   Number of children: Not on file   Years of education: Not on  file   Highest education level: Not on file  Occupational History   Not on file  Tobacco Use   Smoking status: Every Day    Packs/day: 0.50    Years: 50.00    Pack years: 25.00    Types: Cigarettes   Smokeless tobacco: Never  Vaping Use   Vaping Use: Never used  Substance and Sexual Activity   Alcohol use: Yes    Alcohol/week: 1.0 standard drink    Types: 1 Glasses of wine per week    Comment: rare - Holidays   Drug use: No   Sexual activity: Not on file  Other Topics Concern   Not on file   Social History Narrative   Lives in [place]; 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 adrenal gland; lung.    Dementia Father    Cancer Sister        colon cancer- 37s to liver.    Cancer Maternal Grandmother        brain cancer   Diabetes Maternal Grandfather    Stroke Maternal Grandfather    Rectal cancer Daughter        at 20 years; survived.    Melanoma Grandson        dx 22 on neck   Breast cancer Neg Hx     ALLERGIES:  is allergic to tape, nicotine, and bacitracin.  MEDICATIONS:  Current Outpatient Medications  Medication Sig Dispense Refill   ALPRAZolam (XANAX) 0.25 MG tablet Take 0.25 mg by mouth 2 (two) times daily as needed for anxiety.     anastrozole (ARIMIDEX) 1 MG tablet Take 1 tablet (1 mg total) by mouth daily. 90 tablet 1   buPROPion (WELLBUTRIN SR) 150 MG 12 hr tablet Take 150 mg by mouth daily.     clotrimazole-betamethasone (LOTRISONE) cream Apply 1 application topically 2 (two) times daily.     omeprazole (PRILOSEC) 40 MG capsule Take 40 mg by mouth in the morning and at bedtime.     Polyethyl Glycol-Propyl Glycol (SYSTANE) 0.4-0.3 % SOLN Place 1-2 drops into both eyes 3 (three) times daily as needed (dry/irritated eyes).     SPIRIVA HANDIHALER 18 MCG inhalation capsule Place 1 capsule into inhaler and inhale in the morning.     cetirizine (ZYRTEC) 10 MG tablet Take 10 mg by mouth daily as needed for allergies. (Patient not taking: Reported on 03/25/2022)     furosemide (LASIX) 20 MG tablet Take 20 mg by mouth as needed for fluid. (Patient not taking:  Reported on 03/25/2022)     gabapentin (NEURONTIN) 100 MG capsule Take 100 mg by mouth 3 (three) times daily. Two capsules 3 times a day as needed (Patient not taking: Reported on 03/25/2022)     ondansetron (ZOFRAN) 4 MG tablet Take 4 mg by mouth 2 (two) times daily as needed for nausea/vomiting. (Patient not taking: Reported on 03/25/2022)  0   sertraline (ZOLOFT) 50 MG tablet Take 50 mg by mouth daily. (Patient not taking: Reported on 03/25/2022)  No current facility-administered medications for this visit.      Marland Kitchen  PHYSICAL EXAMINATION: ECOG PERFORMANCE STATUS: 0 - Asymptomatic  Vitals:   03/25/22 1104  BP: 134/76  Pulse: 91  Resp: 18  Temp: 98 F (36.7 C)  SpO2: 98%   Filed Weights   03/25/22 1104  Weight: 152 lb 6.4 oz (69.1 kg)    Physical Exam HENT:     Head: Normocephalic and atraumatic.     Mouth/Throat:     Pharynx: No oropharyngeal exudate.  Eyes:     Pupils: Pupils are equal, round, and reactive to light.  Cardiovascular:     Rate and Rhythm: Normal rate and regular rhythm.  Pulmonary:     Effort: Pulmonary effort is normal. No respiratory distress.     Breath sounds: Normal breath sounds. No wheezing.  Abdominal:     General: Bowel sounds are normal. There is no distension.     Palpations: Abdomen is soft. There is no mass.     Tenderness: There is no abdominal tenderness. There is no guarding or rebound.  Musculoskeletal:        General: No tenderness. Normal range of motion.     Cervical back: Normal range of motion and neck supple.  Skin:    General: Skin is warm.  Neurological:     Mental Status: She is alert and oriented to person, place, and time.  Psychiatric:        Mood and Affect: Affect normal.  I am going to provide you   LABORATORY DATA:  I have reviewed the data as listed Lab Results  Component Value Date   WBC 6.9 03/25/2022   HGB 15.5 (H) 03/25/2022   HCT 47.2 (H) 03/25/2022   MCV 93.7 03/25/2022   PLT 281 03/25/2022    Recent Labs    11/27/21 1001 03/25/22 1003  NA 137 139  K 4.2 4.1  CL 101 104  CO2 28 28  GLUCOSE 105* 98  BUN 14 17  CREATININE 0.96 0.90  CALCIUM 8.9 8.9  GFRNONAA >60 >60  PROT  --  7.1  ALBUMIN  --  3.9  AST  --  17  ALT  --  13  ALKPHOS  --  68  BILITOT  --  0.7    RADIOGRAPHIC STUDIES: I have personally reviewed the radiological images as listed and agreed with the findings in the report. No results found.   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 S/p RT [finished June 1st week, 2022].  Currently on anastrozole; [Dr.Diaz] DEC 2022 BIL- mammo-WNL.  # Tolerating well- continue  anastrozole for total of 5 years [summer 2027].  # Post Lumpectomy- ? Neuropathy- STABLE.   # Osteoporosis : BMD T score -2.6 [July 2022]-.on calcium plus vitamin D- STABLE; will order BMD next visit.   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  Monitor for now.  Do not suspect any primary causes. STABLE.   # Smoking: Active smoker; counseled to quit smoking [< 1/2ppd-quitting]. On LDCT-March 2023 [Dr.Fleming]   # DISPOSITION:  # follow up in jan 2024- MD;  labs-cbc/cmp-  Dr.B  All questions were answered. The patient/family knows to call the clinic with any problems, questions or concerns.    Cammie Sickle, MD 03/25/2022 11:40 AM

## 2022-03-25 NOTE — Progress Notes (Signed)
Pt states tenderness to the touch on her left side where her incision is near her breast.

## 2022-03-31 IMAGING — MG DIGITAL SCREENING BILAT W/ TOMO W/ CAD
8 series · 8 of 24 positions shown · non-contrast
Comparison: Previous exam(s).

CLINICAL DATA: Screening.

EXAM:
DIGITAL SCREENING BILATERAL MAMMOGRAM WITH TOMO AND CAD

[L MLO synth-2D]
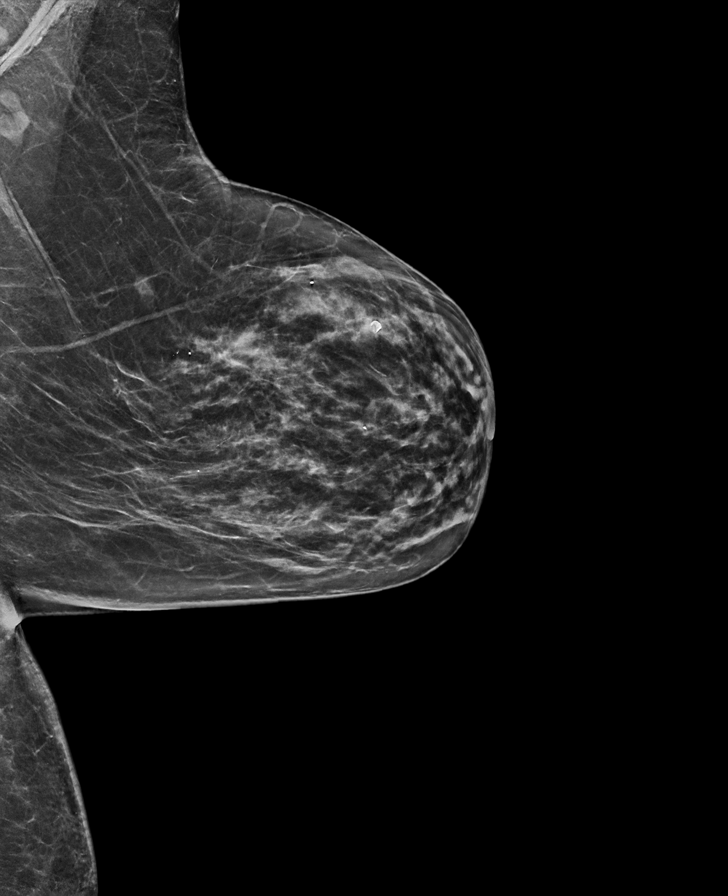

[R MLO synth-2D]
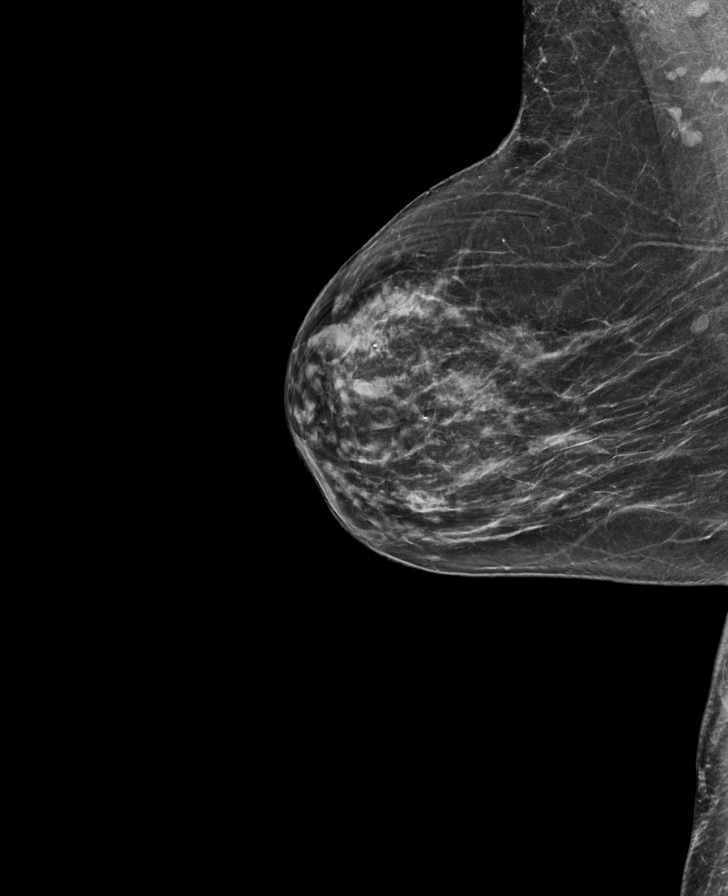

[L CC synth-2D]
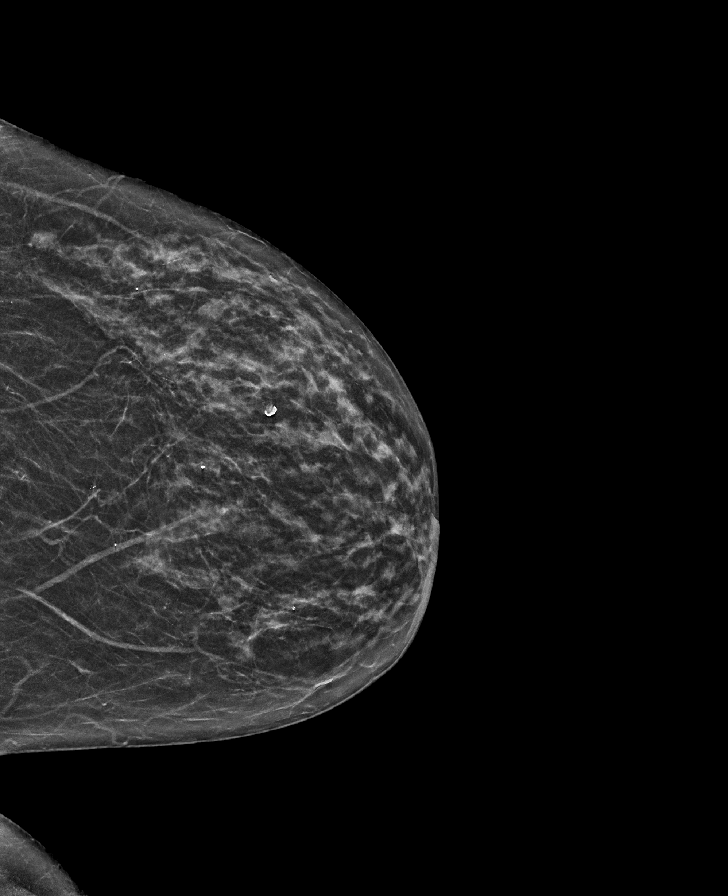

[R CC synth-2D]
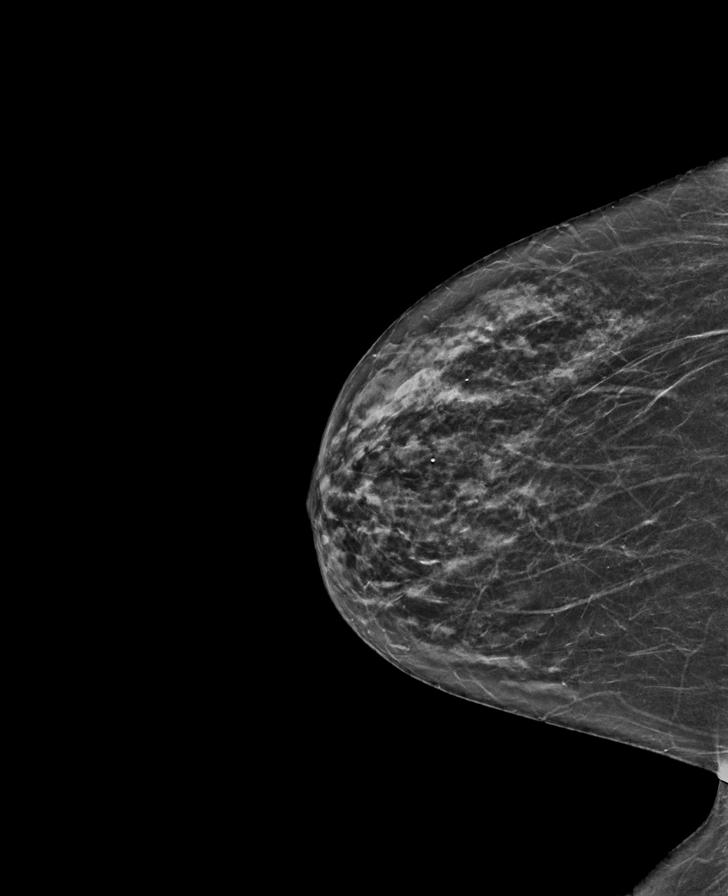

[R MLO tomo · tomo slice 31/60.0]
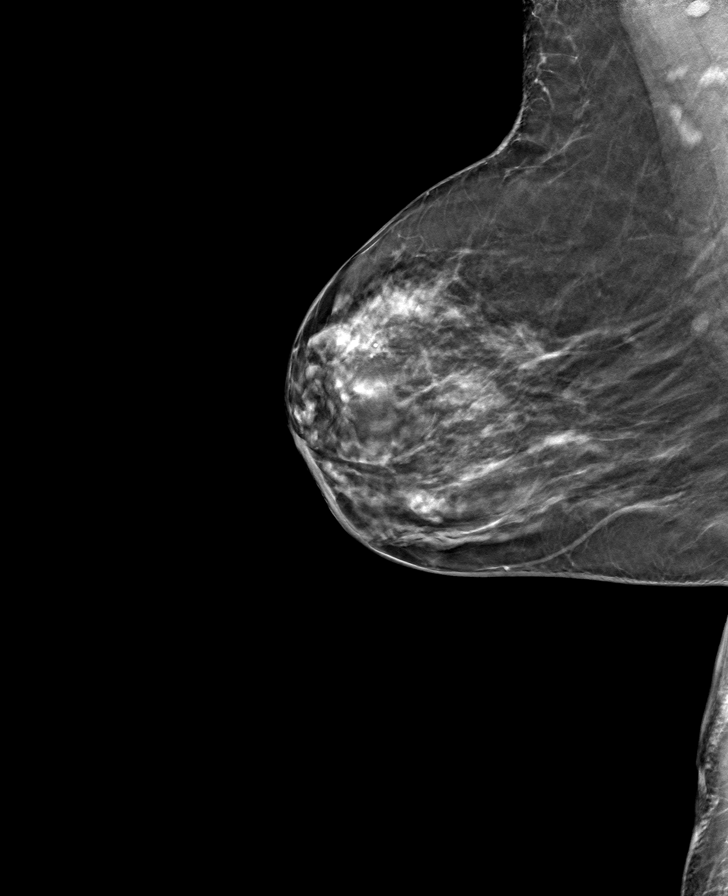

[L CC tomo · tomo slice 27/52.0]
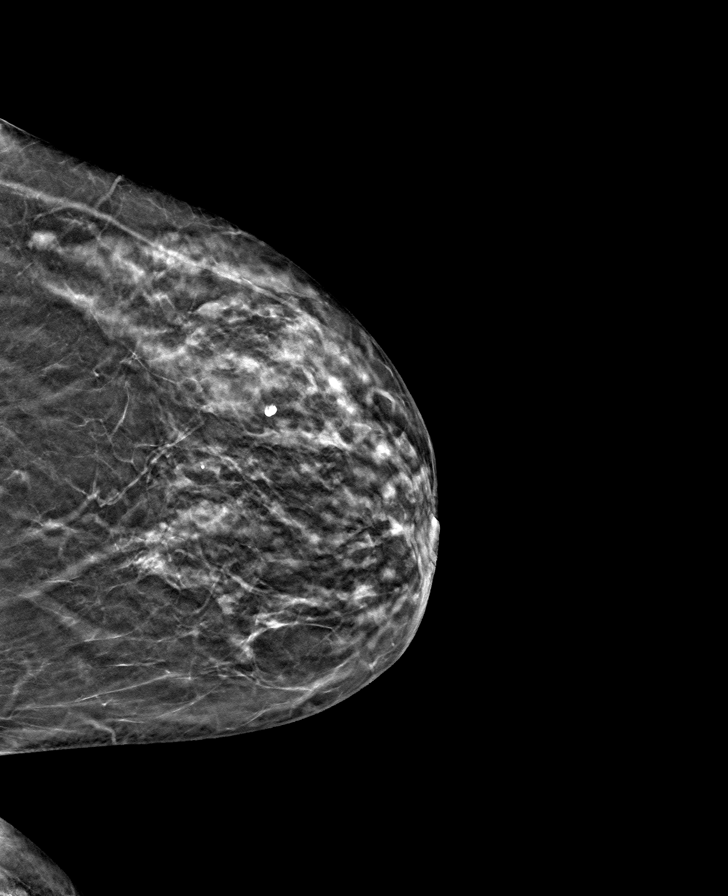

[L MLO tomo · tomo slice 35/68.0]
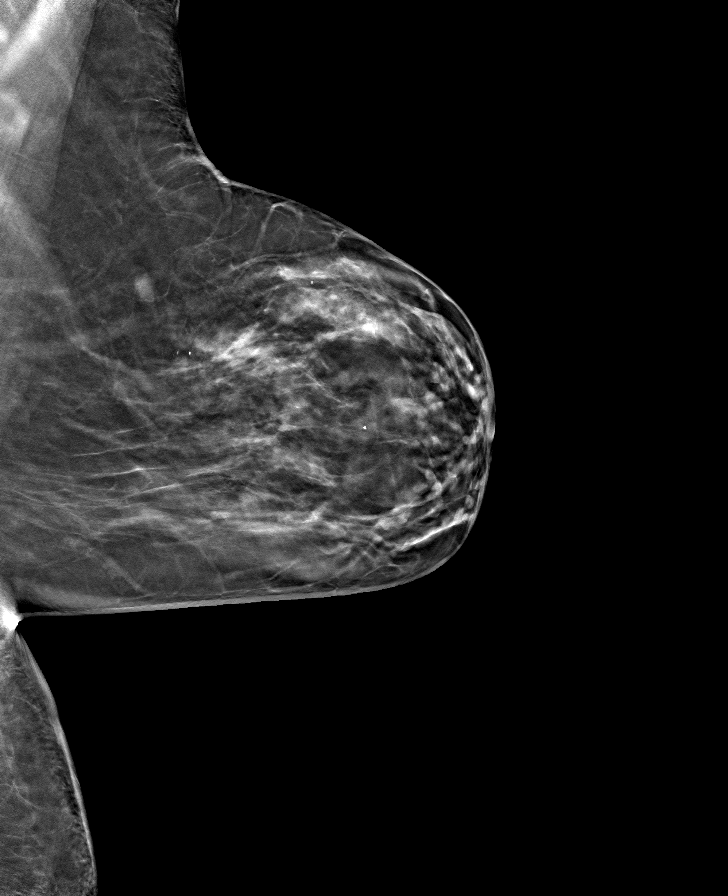

[R CC tomo · tomo slice 29/56.0]
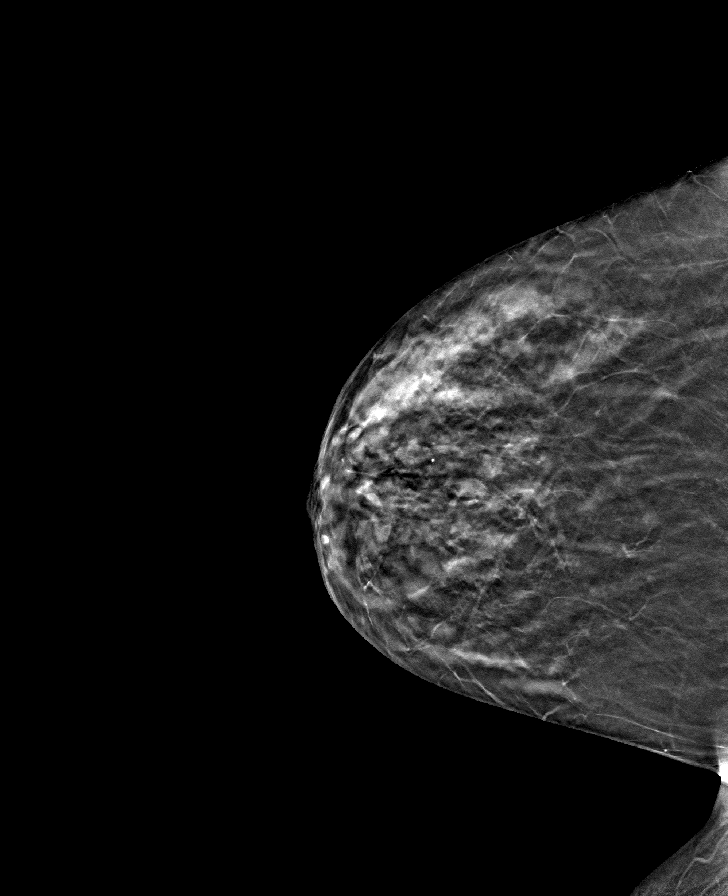

[8 of 24 positions shown; findings below may reference images not displayed]

ACR Breast Density Category c: The breast tissue is heterogeneously
dense, which may obscure small masses.
FINDINGS: In the left breast, a possible mass warrants further evaluation. In
the right breast, no findings suspicious for malignancy.

Images were processed with CAD.
IMPRESSION: Further evaluation is suggested for a possible mass in the left
breast.

RECOMMENDATION:
Diagnostic mammogram and possibly ultrasound of the left breast.
(Code:[id number])

The patient will be contacted regarding the findings, and additional
imaging will be scheduled.

BI-RADS CATEGORY  0: Incomplete. Need additional imaging evaluation
and/or prior mammograms for comparison.

## 2022-04-08 ENCOUNTER — Ambulatory Visit
Admission: RE | Admit: 2022-04-08 | Discharge: 2022-04-08 | Disposition: A | Discharge status: Home / Self Care | Payer: Medicare HMO | Source: Ambulatory Visit | Attending: Radiation Oncology | Admitting: Radiation Oncology

## 2022-04-08 VITALS — BP 128/73 | HR 94 | Temp 98.1°F | Resp 18 | Ht 67.0 in | Wt 152.5 lb

## 2022-04-08 DIAGNOSIS — Z853 Personal history of malignant neoplasm of breast: Secondary | ICD-10-CM | POA: Diagnosis not present

## 2022-04-08 DIAGNOSIS — Z79811 Long term (current) use of aromatase inhibitors: Secondary | ICD-10-CM | POA: Diagnosis not present

## 2022-04-08 DIAGNOSIS — Z923 Personal history of irradiation: Secondary | ICD-10-CM | POA: Insufficient documentation

## 2022-04-08 DIAGNOSIS — Z170 Estrogen receptor positive status [ER+]: Secondary | ICD-10-CM | POA: Insufficient documentation

## 2022-04-08 DIAGNOSIS — Z08 Encounter for follow-up examination after completed treatment for malignant neoplasm: Secondary | ICD-10-CM | POA: Diagnosis not present

## 2022-04-08 DIAGNOSIS — C50812 Malignant neoplasm of overlapping sites of left female breast: Secondary | ICD-10-CM | POA: Diagnosis not present

## 2022-04-08 NOTE — Progress Notes (Signed)
Radiation Oncology Follow up Note  Name: Morgan Valdez   Date:   04/08/2022 MRN:  [id number] DOB: 25-Dec-1945    This 76 y.o. female presents to the clinic today for 1 year follow-up status post whole breast radiation to her left breast for stage Ia ER/PR positive invasive mammary carcinoma.  REFERRING PROVIDER: Tracie Harrier, MD  HPI: Patient is a 76 year old female now about 1 year having completed whole breast radiation to her left breast for stage I ER/PR positive invasive mammary carcinoma.  Seen today in routine follow-up she is doing well.  She specifically denies breast tenderness cough or bone pain..  She had mammograms back in December which I reviewed were BI-RADS 2 benign.  She also had a CT scan back in March which I have reviewed showing no evidence to suggest metastatic disease in her lungs.  She is currently on Arimidex tolerating it well without side effect.  COMPLICATIONS OF TREATMENT: none  FOLLOW UP COMPLIANCE: keeps appointments   PHYSICAL EXAM:  BP 128/73   Pulse 94   Temp 98.1 F (36.7 C)   Resp 18   Ht '5\' 7"'$  (1.702 m)   Wt 152 lb 8 oz (69.2 kg)   BMI 23.88 kg/m  Lungs are clear to A&P cardiac examination essentially unremarkable with regular rate and rhythm. No dominant mass or nodularity is noted in either breast in 2 positions examined. Incision is well-healed. No axillary or supraclavicular adenopathy is appreciated. Cosmetic result is excellent.  Well-developed well-nourished patient in NAD. HEENT reveals PERLA, EOMI, discs not visualized.  Oral cavity is clear. No oral mucosal lesions are identified. Neck is clear without evidence of cervical or supraclavicular adenopathy. Lungs are clear to A&P. Cardiac examination is essentially unremarkable with regular rate and rhythm without murmur rub or thrill. Abdomen is benign with no organomegaly or masses noted. Motor sensory and DTR levels are equal and symmetric in the upper and lower extremities. Cranial nerves  II through XII are grossly intact. Proprioception is intact. No peripheral adenopathy or edema is identified. No motor or sensory levels are noted. Crude visual fields are within normal range.  RADIOLOGY RESULTS: Mammograms and CT scan of the chest reviewed compatible with above-stated findings  PLAN: At the present time patient is doing well with no evidence of disease now at 1 year.  And pleased with her overall progress.  Of asked to see her back in 1 year for follow-up.  She continues on Arimidex.  Patient is to call with any concerns.  I would like to take this opportunity to thank you for allowing me to participate in the care of your patient.Noreene Filbert, MD

## 2022-06-04 DIAGNOSIS — I700 Atherosclerosis of aorta: Secondary | ICD-10-CM | POA: Diagnosis not present

## 2022-06-04 DIAGNOSIS — F3341 Major depressive disorder, recurrent, in partial remission: Secondary | ICD-10-CM | POA: Diagnosis not present

## 2022-06-04 DIAGNOSIS — R7309 Other abnormal glucose: Secondary | ICD-10-CM | POA: Diagnosis not present

## 2022-06-07 ENCOUNTER — Other Ambulatory Visit: Payer: Self-pay | Admitting: Internal Medicine

## 2022-06-10 DIAGNOSIS — F329 Major depressive disorder, single episode, unspecified: Secondary | ICD-10-CM | POA: Diagnosis not present

## 2022-06-10 DIAGNOSIS — R110 Nausea: Secondary | ICD-10-CM | POA: Diagnosis not present

## 2022-06-10 DIAGNOSIS — C50812 Malignant neoplasm of overlapping sites of left female breast: Secondary | ICD-10-CM | POA: Diagnosis not present

## 2022-06-10 DIAGNOSIS — F339 Major depressive disorder, recurrent, unspecified: Secondary | ICD-10-CM | POA: Diagnosis not present

## 2022-06-10 DIAGNOSIS — J449 Chronic obstructive pulmonary disease, unspecified: Secondary | ICD-10-CM | POA: Diagnosis not present

## 2022-06-10 DIAGNOSIS — C50912 Malignant neoplasm of unspecified site of left female breast: Secondary | ICD-10-CM | POA: Diagnosis not present

## 2022-06-10 DIAGNOSIS — F17210 Nicotine dependence, cigarettes, uncomplicated: Secondary | ICD-10-CM | POA: Diagnosis not present

## 2022-06-10 DIAGNOSIS — R7309 Other abnormal glucose: Secondary | ICD-10-CM | POA: Diagnosis not present

## 2022-06-10 DIAGNOSIS — K219 Gastro-esophageal reflux disease without esophagitis: Secondary | ICD-10-CM | POA: Diagnosis not present

## 2022-06-10 DIAGNOSIS — Z170 Estrogen receptor positive status [ER+]: Secondary | ICD-10-CM | POA: Diagnosis not present

## 2022-06-10 DIAGNOSIS — E875 Hyperkalemia: Secondary | ICD-10-CM | POA: Diagnosis not present

## 2022-06-10 DIAGNOSIS — R070 Pain in throat: Secondary | ICD-10-CM | POA: Diagnosis not present

## 2022-06-10 DIAGNOSIS — Z720 Tobacco use: Secondary | ICD-10-CM | POA: Diagnosis not present

## 2022-06-10 DIAGNOSIS — K449 Diaphragmatic hernia without obstruction or gangrene: Secondary | ICD-10-CM | POA: Diagnosis not present

## 2022-06-10 DIAGNOSIS — R7303 Prediabetes: Secondary | ICD-10-CM | POA: Diagnosis not present

## 2022-06-14 ENCOUNTER — Emergency Department: Payer: Medicare HMO

## 2022-06-14 ENCOUNTER — Other Ambulatory Visit: Payer: Self-pay

## 2022-06-14 DIAGNOSIS — K219 Gastro-esophageal reflux disease without esophagitis: Secondary | ICD-10-CM | POA: Diagnosis not present

## 2022-06-14 DIAGNOSIS — J449 Chronic obstructive pulmonary disease, unspecified: Secondary | ICD-10-CM | POA: Diagnosis not present

## 2022-06-14 DIAGNOSIS — R002 Palpitations: Secondary | ICD-10-CM | POA: Diagnosis not present

## 2022-06-14 DIAGNOSIS — J439 Emphysema, unspecified: Secondary | ICD-10-CM | POA: Diagnosis not present

## 2022-06-14 DIAGNOSIS — R0789 Other chest pain: Secondary | ICD-10-CM | POA: Diagnosis not present

## 2022-06-14 DIAGNOSIS — R079 Chest pain, unspecified: Secondary | ICD-10-CM | POA: Diagnosis not present

## 2022-06-14 LAB — COMPREHENSIVE METABOLIC PANEL
ALT: 9 U/L (ref 0–44)
AST: 11 U/L — ABNORMAL LOW (ref 15–41)
Albumin: 3.7 g/dL (ref 3.5–5.0)
Alkaline Phosphatase: 62 U/L (ref 38–126)
Anion gap: 8 (ref 5–15)
BUN: 16 mg/dL (ref 8–23)
CO2: 28 mmol/L (ref 22–32)
Calcium: 9.2 mg/dL (ref 8.9–10.3)
Chloride: 102 mmol/L (ref 98–111)
Creatinine, Ser: 0.94 mg/dL (ref 0.44–1.00)
GFR, Estimated: >60 mL/min (ref >60)
Glucose, Bld: 105 mg/dL — ABNORMAL HIGH (ref 70–99)
Potassium: 4.3 mmol/L (ref 3.5–5.1)
Sodium: 138 mmol/L (ref 135–145)
Total Bilirubin: 0.6 mg/dL (ref 0.3–1.2)
Total Protein: 7 g/dL (ref 6.5–8.1)

## 2022-06-14 LAB — CBC WITH DIFFERENTIAL/PLATELET
Abs Immature Granulocytes: 0.04 10*3/uL (ref 0.00–0.07)
Basophils Absolute: 0 10*3/uL (ref 0.0–0.1)
Basophils Relative: 0 %
Eosinophils Absolute: 0.1 10*3/uL (ref 0.0–0.5)
Eosinophils Relative: 1 %
HCT: 44.3 % (ref 36.0–46.0)
Hemoglobin: 14.4 g/dL (ref 12.0–15.0)
Immature Granulocytes: 0 %
Lymphocytes Relative: 19 %
Lymphs Abs: 2.2 10*3/uL (ref 0.7–4.0)
MCH: 30.6 pg (ref 26.0–34.0)
MCHC: 32.5 g/dL (ref 30.0–36.0)
MCV: 94.3 fL (ref 80.0–100.0)
Monocytes Absolute: 1 10*3/uL (ref 0.1–1.0)
Monocytes Relative: 9 %
Neutro Abs: 8.2 10*3/uL — ABNORMAL HIGH (ref 1.7–7.7)
Neutrophils Relative %: 71 %
Platelets: 302 10*3/uL (ref 150–400)
RBC: 4.7 MIL/uL (ref 3.87–5.11)
RDW: 13.1 % (ref 11.5–15.5)
WBC: 11.5 10*3/uL — ABNORMAL HIGH (ref 4.0–10.5)
nRBC: 0 % (ref 0.0–0.2)

## 2022-06-14 LAB — TROPONIN I (HIGH SENSITIVITY)
Troponin I (High Sensitivity): 4 ng/L (ref <18)
Troponin I (High Sensitivity): 3 ng/L (ref <18)

## 2022-06-14 LAB — LIPASE, BLOOD: Lipase: 24 U/L (ref 11–51)

## 2022-06-14 NOTE — ED Provider Triage Note (Signed)
Emergency Medicine Provider Triage Evaluation Note  Morgan Valdez , a 76 y.o. female  was evaluated in triage.  Pt complains of CP that began this afternoon. Intermittent. Reports that she was just started on pantoprazole. Has pain in her throat. Feels SOB. Had a stress test a few months ago that was normal. No history of PE/DVT.   Review of Systems  Positive: CP, SOB, throat pain Negative: Abd pain  Physical Exam  There were no vitals taken for this visit. Gen:   Awake, no distress   Resp:  Normal effort  MSK:   Moves extremities without difficulty  Other:    Medical Decision Making  Medically screening exam initiated at 7:23 PM.  Appropriate orders placed.  Morgan Valdez was informed that the remainder of the evaluation will be completed by another provider, this initial triage assessment does not replace that evaluation, and the importance of remaining in the ED until their evaluation is complete.     Morgan Old, PA-C 06/14/22 1926

## 2022-06-14 NOTE — ED Triage Notes (Signed)
Pt presents to ER from home with c/o chest pain that has been going on since yesterday and is intermittent in nature.  Pt states pain is in the upper chest/throat and is intermittent in nature.  Pt states she is sob when the pain comes and also feels like her heart is racing.  Pt denies hx of heart problems.  Pt is A&O x4 at this time and in NAD in triage at this time.

## 2022-06-15 ENCOUNTER — Emergency Department
Admission: EM | Admit: 2022-06-15 | Discharge: 2022-06-15 | Disposition: A | Discharge status: Home / Self Care | Payer: Medicare HMO | Attending: Emergency Medicine | Admitting: Emergency Medicine

## 2022-06-15 DIAGNOSIS — R002 Palpitations: Secondary | ICD-10-CM

## 2022-06-15 DIAGNOSIS — K219 Gastro-esophageal reflux disease without esophagitis: Secondary | ICD-10-CM

## 2022-06-15 DIAGNOSIS — R079 Chest pain, unspecified: Secondary | ICD-10-CM

## 2022-06-15 MED ORDER — SUCRALFATE 1 G PO TABS: 1.0000 g | ORAL_TABLET | Freq: Four times a day (QID) | ORAL | Status: DC

## 2022-06-15 NOTE — Discharge Instructions (Addendum)
As we discussed please use your sucralfate 1 tablet before breakfast lunch dinner and before going to sleep for the next 2 weeks.  Please call the number provided to follow-up with cardiology or discussed with your primary care doctor for possible Holter monitor testing.  Return to the emergency department for any worsening chest pain any shortness of breath or any other symptom personally concerning to yourself.

## 2022-06-15 NOTE — ED Provider Notes (Signed)
[facility] Provider Note    Event Date/Time   First MD Initiated Contact with Patient 06/15/22 [phone]     (approximate)  History   Chief Complaint: Chest Pain  HPI  Morgan Valdez is a 77 y.o. female with a past medical history of anxiety, COPD, gastric reflux, presents to the emergency department with complaints of chest discomfort and reflux.  According to the patient over the past several weeks she has been experiencing worsening reflux into her chest and throat.  She has seen her doctor who is switched her from omeprazole to pantoprazole.  She had a negative H. pylori test recently as well.  Patient states recently she has been experiencing some palpitations especially at night.  Patient denies any chest pain currently.  Patient does occasionally have a PVC on the monitor but only 1 or 2-minute during my evaluation.  Physical Exam   Triage Vital Signs: ED Triage Vitals  Enc Vitals Group     BP 06/14/22 1925 (!) 143/76     Pulse Rate 06/14/22 1925 98     Resp 06/14/22 1925 18     Temp 06/14/22 1925 98.6 F (37 C)     Temp Source 06/14/22 1925 Oral     SpO2 06/14/22 1925 98 %     Weight 06/14/22 1926 150 lb (68 kg)     Height 06/14/22 1926 '5\' 7"'$  (1.702 m)     Head Circumference --      Peak Flow --      Pain Score 06/14/22 1926 6     Pain Loc --      Pain Edu? --      Excl. in [place]? --     Most recent vital signs: Vitals:   06/14/22 2232 06/15/22 0215  BP: 127/69 138/62  Pulse: 95 77  Resp: 18 19  Temp:    SpO2: 96% 97%    General: Awake, no distress.  CV:  Good peripheral perfusion.  Regular rate and rhythm  Resp:  Normal effort.  Equal breath sounds bilaterally.  Abd:  No distention.  Soft, nontender.  No rebound or guarding.   ED Results / Procedures / Treatments   EKG  EKG viewed and interpreted by myself shows a normal sinus rhythm at 92 bpm with a narrow QRS, normal axis, normal intervals, no concerning ST  changes.  RADIOLOGY  I have reviewed and interpreted the chest x-ray findings I do not see any significant abnormality on my evaluation.   MEDICATIONS ORDERED IN ED: Medications - No data to display   IMPRESSION / MDM / [place] / ED COURSE  I reviewed the triage vital signs and the nursing notes.  Patient's presentation is most consistent with acute presentation with potential threat to life or bodily function.  Patient presents emergency department for palpitations and chest discomfort described more as a reflux sensation.  Patient has a history of reflux and has recently been started on Protonix instead of omeprazole.  Patient's work-up in the emergency department is reassuring including negative troponin x2.  Reassuring CBC and chemistry.  Reassuring chest x-ray and EKG.  Normal LFTs and lipase.  Given the patient's reassuring work-up and reassuring physical exam I discussed with the patient to follow-up with her PCP as well as cardiology for a Holter monitor.  We will also prescribe sucralfate for the patient.  Given the patient's history and age I did consider admission to the hospital however given the patient's reassuring  work-up physical exam I believe the patient would be safe for discharge home with outpatient follow-up.  Patient agreeable to plan of care.  FINAL CLINICAL IMPRESSION(S) / ED DIAGNOSES   Palpitations Reflux Chest pain  Rx / DC Orders   Sucralfate PCP follow-up Cardiology follow-up  Note:  This document was prepared using Dragon voice recognition software and may include unintentional dictation errors.   Harvest Dark, MD 06/15/22 [phone]

## 2022-08-05 DIAGNOSIS — R918 Other nonspecific abnormal finding of lung field: Secondary | ICD-10-CM | POA: Diagnosis not present

## 2022-08-05 DIAGNOSIS — F17218 Nicotine dependence, cigarettes, with other nicotine-induced disorders: Secondary | ICD-10-CM | POA: Diagnosis not present

## 2022-08-05 DIAGNOSIS — J449 Chronic obstructive pulmonary disease, unspecified: Secondary | ICD-10-CM | POA: Diagnosis not present

## 2022-08-10 DIAGNOSIS — R110 Nausea: Secondary | ICD-10-CM | POA: Diagnosis not present

## 2022-08-10 DIAGNOSIS — K224 Dyskinesia of esophagus: Secondary | ICD-10-CM | POA: Diagnosis not present

## 2022-08-10 DIAGNOSIS — K219 Gastro-esophageal reflux disease without esophagitis: Secondary | ICD-10-CM | POA: Diagnosis not present

## 2022-08-10 DIAGNOSIS — R1314 Dysphagia, pharyngoesophageal phase: Secondary | ICD-10-CM | POA: Diagnosis not present

## 2022-08-10 DIAGNOSIS — K222 Esophageal obstruction: Secondary | ICD-10-CM | POA: Diagnosis not present

## 2022-08-10 DIAGNOSIS — R0789 Other chest pain: Secondary | ICD-10-CM | POA: Diagnosis not present

## 2022-09-03 ENCOUNTER — Telehealth: Payer: Self-pay | Admitting: *Deleted

## 2022-09-03 NOTE — Patient Outreach (Signed)
  Care Coordination   09/03/2022 Name: Morgan Valdez MRN: [id number] DOB: 12/16/1945   Care Coordination Outreach Attempts:  An unsuccessful telephone outreach was attempted today to offer the patient information about available care coordination services as a benefit of their health plan.   Follow Up Plan:  Additional outreach attempts will be made to offer the patient care coordination information and services.   Encounter Outcome:  No Answer  Care Coordination Interventions Activated:  No   Care Coordination Interventions:  No, not indicated    Valente David, RN, MSN, [facility] Care Management Care Management Coordinator [phone]

## 2022-09-06 DIAGNOSIS — K222 Esophageal obstruction: Secondary | ICD-10-CM | POA: Diagnosis not present

## 2022-09-06 DIAGNOSIS — R1310 Dysphagia, unspecified: Secondary | ICD-10-CM | POA: Diagnosis not present

## 2022-09-06 DIAGNOSIS — K2289 Other specified disease of esophagus: Secondary | ICD-10-CM | POA: Diagnosis not present

## 2022-09-06 DIAGNOSIS — K219 Gastro-esophageal reflux disease without esophagitis: Secondary | ICD-10-CM | POA: Diagnosis not present

## 2022-09-06 DIAGNOSIS — K2970 Gastritis, unspecified, without bleeding: Secondary | ICD-10-CM | POA: Diagnosis not present

## 2022-09-06 DIAGNOSIS — K224 Dyskinesia of esophagus: Secondary | ICD-10-CM | POA: Diagnosis not present

## 2022-09-07 ENCOUNTER — Other Ambulatory Visit: Payer: Self-pay | Admitting: General Surgery

## 2022-09-07 DIAGNOSIS — Z853 Personal history of malignant neoplasm of breast: Secondary | ICD-10-CM

## 2022-09-09 ENCOUNTER — Telehealth: Payer: Self-pay | Admitting: *Deleted

## 2022-09-09 NOTE — Patient Outreach (Signed)
  Care Coordination   Initial Visit Note   09/09/2022 Name: Morgan Valdez Case MRN: [id number] DOB: 1946/05/18  Morgan Valdez is a 76 y.o. year old female who sees Morgan Harrier, MD for primary care. I spoke with  Morgan Valdez by phone today.  What matters to the patients health and wellness today?  Member declines to participate in program.   SDOH assessments and interventions completed:  No     Care Coordination Interventions Activated:  No  Care Coordination Interventions:  No, not indicated   Follow up plan: No further intervention required.   Encounter Outcome:  Pt. Refused   Valente David, RN, MSN, [place] Care Management Care Management Coordinator [phone]

## 2022-09-20 DIAGNOSIS — Z961 Presence of intraocular lens: Secondary | ICD-10-CM | POA: Diagnosis not present

## 2022-10-20 ENCOUNTER — Ambulatory Visit
Admission: RE | Admit: 2022-10-20 | Discharge: 2022-10-20 | Disposition: A | Discharge status: Home / Self Care | Payer: Medicare HMO | Source: Ambulatory Visit | Attending: General Surgery | Admitting: General Surgery

## 2022-10-20 DIAGNOSIS — Z853 Personal history of malignant neoplasm of breast: Secondary | ICD-10-CM | POA: Diagnosis not present

## 2022-10-20 DIAGNOSIS — R922 Inconclusive mammogram: Secondary | ICD-10-CM | POA: Diagnosis not present

## 2022-10-28 DIAGNOSIS — C50412 Malignant neoplasm of upper-outer quadrant of left female breast: Secondary | ICD-10-CM | POA: Diagnosis not present

## 2022-10-28 DIAGNOSIS — Z170 Estrogen receptor positive status [ER+]: Secondary | ICD-10-CM | POA: Diagnosis not present

## 2022-11-16 ENCOUNTER — Encounter: Payer: Self-pay | Admitting: Internal Medicine

## 2022-11-16 ENCOUNTER — Inpatient Hospital Stay (HOSPITAL_BASED_OUTPATIENT_CLINIC_OR_DEPARTMENT_OTHER): Payer: Medicare HMO | Admitting: Internal Medicine

## 2022-11-16 ENCOUNTER — Inpatient Hospital Stay: Payer: Medicare HMO | Attending: Internal Medicine

## 2022-11-16 VITALS — BP 131/82 | HR 91 | Temp 96.9°F | Resp 18 | Wt 155.8 lb

## 2022-11-16 DIAGNOSIS — C50812 Malignant neoplasm of overlapping sites of left female breast: Secondary | ICD-10-CM | POA: Diagnosis not present

## 2022-11-16 DIAGNOSIS — Z170 Estrogen receptor positive status [ER+]: Secondary | ICD-10-CM | POA: Insufficient documentation

## 2022-11-16 DIAGNOSIS — Z79811 Long term (current) use of aromatase inhibitors: Secondary | ICD-10-CM | POA: Insufficient documentation

## 2022-11-16 DIAGNOSIS — D751 Secondary polycythemia: Secondary | ICD-10-CM | POA: Diagnosis not present

## 2022-11-16 DIAGNOSIS — F17210 Nicotine dependence, cigarettes, uncomplicated: Secondary | ICD-10-CM | POA: Insufficient documentation

## 2022-11-16 LAB — COMPREHENSIVE METABOLIC PANEL
ALT: 12 U/L (ref 0–44)
AST: 15 U/L (ref 15–41)
Albumin: 3.9 g/dL (ref 3.5–5.0)
Alkaline Phosphatase: 61 U/L (ref 38–126)
Anion gap: 8 (ref 5–15)
BUN: 17 mg/dL (ref 8–23)
CO2: 29 mmol/L (ref 22–32)
Calcium: 8.9 mg/dL (ref 8.9–10.3)
Chloride: 101 mmol/L (ref 98–111)
Creatinine, Ser: 0.98 mg/dL (ref 0.44–1.00)
GFR, Estimated: >60 mL/min (ref >60)
Glucose, Bld: 102 mg/dL — ABNORMAL HIGH (ref 70–99)
Potassium: 4.7 mmol/L (ref 3.5–5.1)
Sodium: 138 mmol/L (ref 135–145)
Total Bilirubin: 0.3 mg/dL (ref 0.3–1.2)
Total Protein: 7.3 g/dL (ref 6.5–8.1)

## 2022-11-16 LAB — CBC WITH DIFFERENTIAL/PLATELET
Abs Immature Granulocytes: 0.02 10*3/uL (ref 0.00–0.07)
Basophils Absolute: 0 10*3/uL (ref 0.0–0.1)
Basophils Relative: 1 %
Eosinophils Absolute: 0.1 10*3/uL (ref 0.0–0.5)
Eosinophils Relative: 1 %
HCT: 47.9 % — ABNORMAL HIGH (ref 36.0–46.0)
Hemoglobin: 15.6 g/dL — ABNORMAL HIGH (ref 12.0–15.0)
Immature Granulocytes: 0 %
Lymphocytes Relative: 26 %
Lymphs Abs: 2.3 10*3/uL (ref 0.7–4.0)
MCH: 30.7 pg (ref 26.0–34.0)
MCHC: 32.6 g/dL (ref 30.0–36.0)
MCV: 94.3 fL (ref 80.0–100.0)
Monocytes Absolute: 0.7 10*3/uL (ref 0.1–1.0)
Monocytes Relative: 8 %
Neutro Abs: 5.8 10*3/uL (ref 1.7–7.7)
Neutrophils Relative %: 64 %
Platelets: 273 10*3/uL (ref 150–400)
RBC: 5.08 MIL/uL (ref 3.87–5.11)
RDW: 13.3 % (ref 11.5–15.5)
WBC: 8.9 10*3/uL (ref 4.0–10.5)
nRBC: 0 % (ref 0.0–0.2)

## 2022-11-16 NOTE — Progress Notes (Signed)
[facility] [place] NOTE  Patient Care Team: Tracie Harrier, MD as PCP - General (Internal Medicine) Theodore Demark, RN (Inactive) as Oncology Nurse Navigator Cammie Sickle, MD as Consulting Physician (Internal Medicine) Noreene Filbert, MD as Referring Physician (Radiation Oncology) Herbert Pun, MD as Consulting Physician (General Surgery)  CHIEF COMPLAINTS/PURPOSE OF CONSULTATION: Breast cancer  #  Oncology History Overview Note  #  BREAST, LEFT 2:00; ULTRASOUND-GUIDED BIOPSY:  - INVASIVE MAMMARY CARCINOMA WITH FEATURES SUGGESTIVE OF SOLID  PSEUDOPAPILLARY CARCINOMA; ER >90%; PR > 90%; he 2 Neu-NEG [Dr.Cinton]  PATHOLOGIC STAGE CLASSIFICATION (pTNM, AJCC 8th Edition):  TNM Descriptors: Not applicable  [id number]  Regional Lymph Nodes Modifier: sn  pN0  pM - Not applicable   SPECIAL STUDIES  Breast Biomarker Testing Performed on Previous Biopsy: [id number]  Estrogen Receptor (ER) Status: POSITIVE          Percentage of cells with nuclear positivity: Greater than 90%          Average intensity of staining: Strong   Progesterone Receptor (PgR) Status: POSITIVE          Percentage of cells with nuclear positivity: Greater than 90%          Average intensity of staining: Moderate   HER2 (by immunohistochemistry): NEGATIVE (Score 1+)  #Cancer screening : colonoscopy [since 56 years; again aug 2022];smoker;        Carcinoma of overlapping sites of left breast in female, estrogen receptor positive ([place])  01/19/2021 Initial Diagnosis   Carcinoma of overlapping sites of left breast in female, estrogen receptor positive ([place])   01/19/2021 Cancer Staging   Staging form: Breast, AJCC 8th Edition - Clinical: Stage IA (cT1a, cN0, cM0, G2, ER+, PR+, HER2-) - Signed by Cammie Sickle, MD on 01/19/2021 Histologic grading system: 3 grade system    Genetic Testing   Negative genetic testing. No pathogenic variants identified on the [facility]  Multi-Cancer Panel+RNA. The report date is 03/18/2021.  The Multi-Cancer Panel + RNA offered by [facility] includes sequencing and/or deletion duplication testing of the following 84 genes: AIP, ALK, APC, ATM, AXIN2,BAP1,  BARD1, BLM, BMPR1A, BRCA1, BRCA2, BRIP1, CASR, CDC73, CDH1, CDK4, CDKN1B, CDKN1C, CDKN2A (p14ARF), CDKN2A (p16INK4a), CEBPA, CHEK2, CTNNA1, DICER1, DIS3L2, EGFR (c.2369C>T, p.Thr790Met variant only), EPCAM (Deletion/duplication testing only), FH, FLCN, GATA2, GPC3, GREM1 (Promoter region deletion/duplication testing only), HOXB13 (c.251G>A, p.Gly84Glu), HRAS, KIT, MAX, MEN1, MET, MITF (c.952G>A, p.Glu318Lys variant only), MLH1, MSH2, MSH3, MSH6, MUTYH, NBN, NF1, NF2, NTHL1, PALB2, PDGFRA, PHOX2B, PMS2, POLD1, POLE, POT1, PRKAR1A, PTCH1, PTEN, RAD50, RAD51C, RAD51D, RB1, RECQL4, RET, RUNX1, SDHAF2, SDHA (sequence changes only), SDHB, SDHC, SDHD, SMAD4, SMARCA4, SMARCB1, SMARCE1, STK11, SUFU, TERC, TERT, TMEM127, TP53, TSC1, TSC2, VHL, WRN and WT1.    HISTORY OF PRESENTING ILLNESS: Ambulating independently.  Alone.  Morgan Valdez 77 y.o.  female stage I breast cancer ER/PR positive HER2 negative-anastrozole is here for follow-up.  Patient had recent evaluation with GI for continued nausea and acid reflux.     Still having left side left breast area pain with movement that was evaluated by surgeon in 10/2022 and was told pain was due to XRT not scare tissue Denies any significant hot flashes.  Denies any worsening joint pains.  Patient admits to compliance with her anastrozole.  Review of Systems  Constitutional:  Negative for chills, diaphoresis, fever, malaise/fatigue and weight loss.  HENT:  Negative for nosebleeds and sore throat.   Eyes:  Negative for double vision.  Respiratory:  Negative for cough,  hemoptysis, sputum production, shortness of breath and wheezing.   Cardiovascular:  Negative for chest pain, palpitations, orthopnea and leg swelling.  Gastrointestinal:  Negative  for abdominal pain, blood in stool, constipation, diarrhea, heartburn, melena, nausea and vomiting.  Genitourinary:  Negative for dysuria, frequency and urgency.  Musculoskeletal:  Positive for back pain. Negative for joint pain.  Skin:  Positive for rash. Negative for itching.  Neurological:  Negative for dizziness, tingling, focal weakness, weakness and headaches.  Endo/Heme/Allergies:  Does not bruise/bleed easily.  Psychiatric/Behavioral:  Negative for depression. The patient is not nervous/anxious and does not have insomnia.      MEDICAL HISTORY:  Past Medical History:  Diagnosis Date   Allergic state    Anxiety    Arthritis    Barrett's esophagus    Breast cancer (HCC)    Left   Chronic cough    COPD (chronic obstructive pulmonary disease) (HCC)    Depression    Duodenitis    Emphysema of lung (HCC)    Esophageal motility disorder    Family history of adrenal cancer    Family history of brain cancer    Family history of colon cancer    Family history of lung cancer    Family history of melanoma    Gastritis    GERD (gastroesophageal reflux disease)    Headache    migraines - 3-4x/yr   Osteoporosis, post-menopausal    Personal history of radiation therapy 2022   breast cancer   Pre-diabetes    Tobacco abuse disorder    Varicose veins of legs     SURGICAL HISTORY: Past Surgical History:  Procedure Laterality Date   APPENDECTOMY     BREAST BIOPSY Left 01/02/2021   [facility] WITH FEATURES SUGGESTIVE OF SOLID PSEUDOPAPILLARY CARCINOMA   BREAST LUMPECTOMY Left 12/2020   SOLID PAPILLARY CARCINOMA, 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   BREAST LUMPECTOMY,RADIO FREQ LOCALIZER,AXILLARY SENTINEL LYMPH NODE BIOPSY Left 01/28/2021   Procedure: BREAST LUMPECTOMY,RADIO FREQ LOCALIZER,AXILLARY SENTINEL LYMPH NODE BIOPSY;  Surgeon: Herbert Pun, MD;  Location: [facility];  Service: General;  Laterality: Left;   CATARACT EXTRACTION W/PHACO Right 01/03/2017   Procedure: CATARACT  EXTRACTION PHACO AND INTRAOCULAR LENS PLACEMENT ([place])  Right;  Surgeon: Ronnell Freshwater, MD;  Location: [place];  Service: Ophthalmology;  Laterality: Right;   CATARACT EXTRACTION W/PHACO Left 01/17/2017   Procedure: CATARACT EXTRACTION PHACO AND INTRAOCULAR LENS PLACEMENT ([facility]);  Surgeon: Ronnell Freshwater, MD;  Location: [place];  Service: Ophthalmology;  Laterality: Left;  left   CHOLECYSTECTOMY     COLONOSCOPY WITH PROPOFOL N/A 04/06/2018   Procedure: COLONOSCOPY WITH PROPOFOL;  Surgeon: Lollie Sails, MD;  Location: [facility] ENDOSCOPY;  Service: Endoscopy;  Laterality: N/A;   COLONOSCOPY WITH PROPOFOL N/A 06/08/2021   Procedure: COLONOSCOPY WITH PROPOFOL;  Surgeon: Toledo, Benay Pike, MD;  Location: [facility] ENDOSCOPY;  Service: Gastroenterology;  Laterality: N/A;   ESOPHAGOGASTRODUODENOSCOPY N/A 09/26/2018   Procedure: ESOPHAGOGASTRODUODENOSCOPY (EGD);  Surgeon: Lollie Sails, MD;  Location: [facility] ENDOSCOPY;  Service: Endoscopy;  Laterality: N/A;   ESOPHAGOGASTRODUODENOSCOPY (EGD) WITH PROPOFOL N/A 10/14/2015   Procedure: ESOPHAGOGASTRODUODENOSCOPY (EGD) WITH PROPOFOL;  Surgeon: Lollie Sails, MD;  Location: [facility] ENDOSCOPY;  Service: Endoscopy;  Laterality: N/A;   EYE SURGERY Bilateral 2019   cataracts    FRACTURE SURGERY     HERNIA REPAIR      SOCIAL HISTORY: Social History   Socioeconomic History   Marital status: Widowed    Spouse name: Not on file  Number of children: Not on file   Years of education: Not on file   Highest education level: Not on file  Occupational History   Not on file  Tobacco Use   Smoking status: Every Day    Packs/day: 0.50    Years: 50.00    Total pack years: 25.00    Types: Cigarettes   Smokeless tobacco: Never  Vaping Use   Vaping Use: Never used  Substance and Sexual Activity   Alcohol use: Yes    Alcohol/week: 1.0 standard drink of alcohol    Types: 1 Glasses of wine per week    Comment: rare -  Holidays   Drug use: No   Sexual activity: Not on file  Other Topics Concern   Not on file  Social History Narrative   Lives in [place]; 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 adrenal gland; lung.    Dementia Father    Cancer Sister        colon cancer- 48s to liver.    Cancer Maternal Grandmother        brain cancer   Diabetes Maternal Grandfather    Stroke Maternal Grandfather    Rectal cancer Daughter        at 64 years; survived.    Melanoma Grandson        dx 22 on neck   Breast cancer Neg Hx     ALLERGIES:  is allergic to tape, nicotine, and bacitracin.  MEDICATIONS:  Current Outpatient Medications  Medication Sig Dispense Refill   ALPRAZolam (XANAX) 0.25 MG tablet Take 0.25 mg by mouth 2 (two) times daily as needed for anxiety.     anastrozole (ARIMIDEX) 1 MG tablet TAKE 1 TABLET EVERY DAY 90 tablet 1   buPROPion (WELLBUTRIN SR) 150 MG 12 hr tablet Take 150 mg by mouth daily.     clotrimazole-betamethasone (LOTRISONE) cream Apply 1 application topically 2 (two) times daily.     esomeprazole (NEXIUM) 40 MG capsule Take 40 mg by mouth 2 (two) times daily.     famotidine (PEPCID) 20 MG tablet Take 20 mg by mouth 2 (two) times daily.     Polyethyl Glycol-Propyl Glycol (SYSTANE) 0.4-0.3 % SOLN Place 1-2 drops into both eyes 3 (three) times daily as needed (dry/irritated eyes).     SPIRIVA HANDIHALER 18 MCG inhalation capsule Place 1 capsule into inhaler and inhale in the morning.     cetirizine (ZYRTEC) 10 MG tablet Take 10 mg by mouth daily as needed for allergies.  (Patient not taking: Reported on 03/25/2022)     furosemide (LASIX) 20 MG tablet Take 20 mg by mouth as needed for fluid. (Patient not taking: Reported on 03/25/2022)     gabapentin (NEURONTIN) 100 MG capsule Take 100 mg by mouth 3 (three) times daily. Two capsules 3 times a day as needed (Patient not taking: Reported on 03/25/2022)     omeprazole (PRILOSEC) 40 MG capsule Take 40 mg by mouth in the morning and at bedtime. (Patient not taking: Reported on  11/16/2022)     ondansetron (ZOFRAN) 4 MG tablet Take 4 mg by mouth 2 (two) times daily as needed for nausea/vomiting. (Patient not taking: Reported on 03/25/2022)  0   sertraline (ZOLOFT) 50 MG tablet Take 50 mg by mouth daily. (Patient not taking: Reported on 03/25/2022)     sucralfate (CARAFATE) 1 g tablet Take 1 tablet (1 g total) by mouth 4 (four) times daily. (Patient not taking: Reported on 11/16/2022) 60 tablet 00   No current facility-administered medications for this visit.      Marland Kitchen  PHYSICAL EXAMINATION: ECOG PERFORMANCE STATUS: 0 - Asymptomatic  Vitals:   11/16/22 0900  BP: 131/82  Pulse: 91  Resp: 18  Temp: (!) 96.9 F (36.1 C)   Filed Weights   11/16/22 0900  Weight: 155 lb 12.8 oz (70.7 kg)    Physical Exam HENT:     Head: Normocephalic and atraumatic.     Mouth/Throat:     Pharynx: No oropharyngeal exudate.  Eyes:     Pupils: Pupils are equal, round, and reactive to light.  Cardiovascular:     Rate and Rhythm: Normal rate and regular rhythm.  Pulmonary:     Effort: Pulmonary effort is normal. No respiratory distress.     Breath sounds: Normal breath sounds. No wheezing.  Abdominal:     General: Bowel sounds are normal. There is no distension.     Palpations: Abdomen is soft. There is no mass.     Tenderness: There is no abdominal tenderness. There is no guarding or rebound.  Musculoskeletal:        General: No tenderness. Normal range of motion.     Cervical back: Normal range of motion and neck supple.   Skin:    General: Skin is warm.  Neurological:     Mental Status: She is alert and oriented to person, place, and time.  Psychiatric:        Mood and Affect: Affect normal.   I am going to provide you   LABORATORY DATA:  I have reviewed the data as listed Lab Results  Component Value Date   WBC 8.9 11/16/2022   HGB 15.6 (H) 11/16/2022   HCT 47.9 (H) 11/16/2022   MCV 94.3 11/16/2022   PLT 273 11/16/2022   Recent Labs    03/25/22 1003 06/14/22 1928 11/16/22 0921  NA 139 138 138  K 4.1 4.3 4.7  CL 104 102 101  CO2 '28 28 29  '$ GLUCOSE 98 105* 102*  BUN '17 16 17  '$ CREATININE 0.90 0.94 0.98  CALCIUM 8.9 9.2 8.9  GFRNONAA >60 >60 >60  PROT 7.1 7.0 7.3  ALBUMIN 3.9 3.7 3.9  AST 17 11* 15  ALT '13 9 12  '$ ALKPHOS 68 62 61  BILITOT 0.7 0.6 0.3    RADIOGRAPHIC STUDIES: I have personally reviewed the radiological images as listed and agreed with the findings in the report. MM DIAG BREAST TOMO BILATERAL  Result Date: 10/20/2022 CLINICAL DATA:  Annual mammographic surveillance. Patient with a history of a left lumpectomy for breast carcinoma, performed on 01/28/2021, treated with adjuvant radiation therapy. EXAM: DIGITAL DIAGNOSTIC BILATERAL MAMMOGRAM WITH TOMOSYNTHESIS TECHNIQUE: Bilateral digital diagnostic mammography and breast tomosynthesis was performed. COMPARISON:  Previous exam(s). ACR Breast Density Category c: The breast tissue is heterogeneously dense, which may obscure small masses. FINDINGS: Post lumpectomy changes on the left. Post lumpectomy scarring has contracted compared to the previous exam. There are no breast masses, areas of nonsurgical architectural distortion, areas of  significant asymmetry or suspicious calcifications. IMPRESSION: 1. No evidence new or recurrent breast carcinoma. 2. Benign post lumpectomy changes on the left. RECOMMENDATION: Diagnostic mammography in 1 year per standard post lumpectomy protocol. I have discussed the findings and recommendations  with the patient. If applicable, a reminder letter will be sent to the patient regarding the next appointment. BI-RADS CATEGORY  2: Benign. Electronically Signed   By: Lajean Manes M.D.   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 S/p RT [finished June 1st week, 2022].  Currently on anastrozole; [Dr.Diaz] DEC 2023 BIL- mammo-WNL. Stable.   # Tolerating well- continue  anastrozole for total of 5 years [summer 2027].  # Post Lumpectomy/neuropathic pain- G-2-3- [previously on gabapentin for back].  No clinical concerns for recurrent disease.  Will try to take home dose 100 gabapentin. Discussed re: drowsiness/fatigue. Will refill if needed.   # Osteoporosis : BMD T score -2.6 [July 2022]-.on calcium plus vitamin D- will order BMD today.   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  Monitor for now.  Do not suspect any primary causes. Stable.   # Smoking: Active smoker; counseled to quit smoking [< 1/2ppd-quitting]. On LDCT-March 2023 [Dr.Fleming]- stable.   # DISPOSITION:  # follow up in 6 months- MD;  labs-cbc/cmp; BMD prior--  Dr.B  All questions were answered. The patient/family knows to call the clinic with any problems, questions or concerns.    Cammie Sickle, MD 11/16/2022 11:29 AM

## 2022-11-16 NOTE — Assessment & Plan Note (Addendum)
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 S/p RT [finished June 1st week, 2022].  Currently on anastrozole; [Dr.Diaz] DEC 2023 BIL- mammo-WNL. Stable.   # Tolerating well- continue  anastrozole for total of 5 years [summer 2027].  # Post Lumpectomy/neuropathic pain- G-2-3- [previously on gabapentin for back].  No clinical concerns for recurrent disease.  Will try to take home dose 100 gabapentin. Discussed re: drowsiness/fatigue. Will refill if needed.   # Osteoporosis : BMD T score -2.6 [July 2022]-.on calcium plus vitamin D- will order BMD today.   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  Monitor for now.  Do not suspect any primary causes. Stable.   # Smoking: Active smoker; counseled to quit smoking [< 1/2ppd-quitting]. On LDCT-March 2023 [Dr.Fleming]- stable.   # DISPOSITION:  # follow up in 6 months- MD;  labs-cbc/cmp; BMD prior--  Dr.B

## 2022-11-16 NOTE — Progress Notes (Signed)
Patient had recent evaluation with GI for continued nausea and acid reflux.    Still having left side left breast area pain with movement that was evaluated by surgeon in 10/2022 and was told pain was due to XRT not scare tissue.

## 2022-11-24 DIAGNOSIS — R7309 Other abnormal glucose: Secondary | ICD-10-CM | POA: Diagnosis not present

## 2022-11-24 DIAGNOSIS — R070 Pain in throat: Secondary | ICD-10-CM | POA: Diagnosis not present

## 2022-11-24 DIAGNOSIS — R110 Nausea: Secondary | ICD-10-CM | POA: Diagnosis not present

## 2022-11-24 DIAGNOSIS — Z720 Tobacco use: Secondary | ICD-10-CM | POA: Diagnosis not present

## 2022-11-24 DIAGNOSIS — C50812 Malignant neoplasm of overlapping sites of left female breast: Secondary | ICD-10-CM | POA: Diagnosis not present

## 2022-11-24 DIAGNOSIS — I700 Atherosclerosis of aorta: Secondary | ICD-10-CM | POA: Diagnosis not present

## 2022-11-24 DIAGNOSIS — K449 Diaphragmatic hernia without obstruction or gangrene: Secondary | ICD-10-CM | POA: Diagnosis not present

## 2022-11-24 DIAGNOSIS — E875 Hyperkalemia: Secondary | ICD-10-CM | POA: Diagnosis not present

## 2022-11-24 DIAGNOSIS — F339 Major depressive disorder, recurrent, unspecified: Secondary | ICD-10-CM | POA: Diagnosis not present

## 2022-12-01 DIAGNOSIS — R110 Nausea: Secondary | ICD-10-CM | POA: Diagnosis not present

## 2022-12-01 DIAGNOSIS — F339 Major depressive disorder, recurrent, unspecified: Secondary | ICD-10-CM | POA: Diagnosis not present

## 2022-12-01 DIAGNOSIS — Z720 Tobacco use: Secondary | ICD-10-CM | POA: Diagnosis not present

## 2022-12-01 DIAGNOSIS — M542 Cervicalgia: Secondary | ICD-10-CM | POA: Diagnosis not present

## 2022-12-01 DIAGNOSIS — F17210 Nicotine dependence, cigarettes, uncomplicated: Secondary | ICD-10-CM | POA: Diagnosis not present

## 2022-12-01 DIAGNOSIS — K219 Gastro-esophageal reflux disease without esophagitis: Secondary | ICD-10-CM | POA: Diagnosis not present

## 2022-12-01 DIAGNOSIS — Z853 Personal history of malignant neoplasm of breast: Secondary | ICD-10-CM | POA: Diagnosis not present

## 2022-12-01 DIAGNOSIS — J449 Chronic obstructive pulmonary disease, unspecified: Secondary | ICD-10-CM | POA: Diagnosis not present

## 2022-12-01 DIAGNOSIS — C50812 Malignant neoplasm of overlapping sites of left female breast: Secondary | ICD-10-CM | POA: Diagnosis not present

## 2022-12-01 DIAGNOSIS — K449 Diaphragmatic hernia without obstruction or gangrene: Secondary | ICD-10-CM | POA: Diagnosis not present

## 2022-12-01 DIAGNOSIS — F329 Major depressive disorder, single episode, unspecified: Secondary | ICD-10-CM | POA: Diagnosis not present

## 2022-12-01 DIAGNOSIS — J3089 Other allergic rhinitis: Secondary | ICD-10-CM | POA: Diagnosis not present

## 2022-12-01 DIAGNOSIS — R519 Headache, unspecified: Secondary | ICD-10-CM | POA: Diagnosis not present

## 2022-12-01 DIAGNOSIS — R070 Pain in throat: Secondary | ICD-10-CM | POA: Diagnosis not present

## 2022-12-01 DIAGNOSIS — Z0000 Encounter for general adult medical examination without abnormal findings: Secondary | ICD-10-CM | POA: Diagnosis not present

## 2022-12-01 DIAGNOSIS — R7309 Other abnormal glucose: Secondary | ICD-10-CM | POA: Diagnosis not present

## 2022-12-01 DIAGNOSIS — E875 Hyperkalemia: Secondary | ICD-10-CM | POA: Diagnosis not present

## 2022-12-03 DIAGNOSIS — M542 Cervicalgia: Secondary | ICD-10-CM | POA: Diagnosis not present

## 2022-12-09 ENCOUNTER — Emergency Department: Payer: Medicare HMO

## 2022-12-09 ENCOUNTER — Emergency Department
Admission: EM | Admit: 2022-12-09 | Discharge: 2022-12-09 | Disposition: A | Discharge status: Home / Self Care | Payer: Medicare HMO | Attending: Emergency Medicine | Admitting: Emergency Medicine

## 2022-12-09 ENCOUNTER — Other Ambulatory Visit: Payer: Self-pay

## 2022-12-09 DIAGNOSIS — R200 Anesthesia of skin: Secondary | ICD-10-CM | POA: Diagnosis not present

## 2022-12-09 DIAGNOSIS — R0602 Shortness of breath: Secondary | ICD-10-CM | POA: Diagnosis not present

## 2022-12-09 DIAGNOSIS — J439 Emphysema, unspecified: Secondary | ICD-10-CM | POA: Diagnosis not present

## 2022-12-09 DIAGNOSIS — R42 Dizziness and giddiness: Secondary | ICD-10-CM | POA: Insufficient documentation

## 2022-12-09 DIAGNOSIS — J449 Chronic obstructive pulmonary disease, unspecified: Secondary | ICD-10-CM | POA: Diagnosis not present

## 2022-12-09 DIAGNOSIS — R918 Other nonspecific abnormal finding of lung field: Secondary | ICD-10-CM | POA: Diagnosis not present

## 2022-12-09 DIAGNOSIS — N179 Acute kidney failure, unspecified: Secondary | ICD-10-CM | POA: Insufficient documentation

## 2022-12-09 DIAGNOSIS — R531 Weakness: Secondary | ICD-10-CM | POA: Insufficient documentation

## 2022-12-09 DIAGNOSIS — I491 Atrial premature depolarization: Secondary | ICD-10-CM | POA: Diagnosis not present

## 2022-12-09 DIAGNOSIS — F17210 Nicotine dependence, cigarettes, uncomplicated: Secondary | ICD-10-CM | POA: Diagnosis not present

## 2022-12-09 LAB — BASIC METABOLIC PANEL
Anion gap: 15 (ref 5–15)
BUN: 18 mg/dL (ref 8–23)
CO2: 21 mmol/L — ABNORMAL LOW (ref 22–32)
Calcium: 8.7 mg/dL — ABNORMAL LOW (ref 8.9–10.3)
Chloride: 100 mmol/L (ref 98–111)
Creatinine, Ser: 1.07 mg/dL — ABNORMAL HIGH (ref 0.44–1.00)
GFR, Estimated: 54 mL/min — ABNORMAL LOW (ref >60)
Glucose, Bld: 101 mg/dL — ABNORMAL HIGH (ref 70–99)
Potassium: 3.6 mmol/L (ref 3.5–5.1)
Sodium: 136 mmol/L (ref 135–145)

## 2022-12-09 LAB — URINALYSIS, ROUTINE W REFLEX MICROSCOPIC
Bilirubin Urine: NEGATIVE
Glucose, UA: NEGATIVE mg/dL
Hgb urine dipstick: NEGATIVE
Ketones, ur: NEGATIVE mg/dL
Nitrite: NEGATIVE
Protein, ur: NEGATIVE mg/dL
Specific Gravity, Urine: 1.014 (ref 1.005–1.030)
pH: 5 (ref 5.0–8.0)

## 2022-12-09 LAB — TROPONIN I (HIGH SENSITIVITY)
Troponin I (High Sensitivity): 7 ng/L (ref <18)
Troponin I (High Sensitivity): 7 ng/L (ref <18)

## 2022-12-09 LAB — CBC
HCT: 46.5 % — ABNORMAL HIGH (ref 36.0–46.0)
Hemoglobin: 15.1 g/dL — ABNORMAL HIGH (ref 12.0–15.0)
MCH: 30.3 pg (ref 26.0–34.0)
MCHC: 32.5 g/dL (ref 30.0–36.0)
MCV: 93.4 fL (ref 80.0–100.0)
Platelets: 329 10*3/uL (ref 150–400)
RBC: 4.98 MIL/uL (ref 3.87–5.11)
RDW: 13.4 % (ref 11.5–15.5)
WBC: 10.1 10*3/uL (ref 4.0–10.5)
nRBC: 0 % (ref 0.0–0.2)

## 2022-12-09 NOTE — ED Provider Notes (Signed)
[facility] Provider Note    Event Date/Time   First MD Initiated Contact with Patient 12/09/22 1537     (approximate)   History   Chief Complaint Dizziness, Weakness, and Shortness of Breath   HPI  Morgan Valdez is a 77 y.o. female with past medical history of COPD, GERD, and anxiety who presents to the ED complaining of shortness of breath.  Patient reports that she was laying in bed at home about an hour prior to arrival when she had sudden onset of difficulty breathing.  This was associated with feeling like her heart is racing as well as sensation of diffuse numbness and generalized weakness.  She denies any associated chest pain and has not had any recent fever or cough.  She called EMS, who reports normal vital signs and no respiratory distress.  Patient reports that difficulty breathing has been waxing and waning since then, has not described as relatively mild.  She denies any vision changes or speech changes, describes numbness and weakness as generalized.     Physical Exam   Triage Vital Signs: ED Triage Vitals  Enc Vitals Group     BP 12/09/22 1538 (!) 140/96     Pulse Rate 12/09/22 1538 100     Resp 12/09/22 1538 18     Temp 12/09/22 1538 98.1 F (36.7 C)     Temp src --      SpO2 12/09/22 1538 100 %     Weight --      Height --      Head Circumference --      Peak Flow --      Pain Score 12/09/22 1535 0     Pain Loc --      Pain Edu? --      Excl. in [place]? --     Most recent vital signs: Vitals:   12/09/22 1538 12/09/22 1816  BP: (!) 140/96   Pulse: 100   Resp: 18   Temp: 98.1 F (36.7 C)   SpO2: 100% 98%    Constitutional: Alert and oriented. Eyes: Conjunctivae are normal. Head: Atraumatic. Nose: No congestion/rhinnorhea. Mouth/Throat: Mucous membranes are moist.  Cardiovascular: Normal rate, regular rhythm. Grossly normal heart sounds.  2+ radial pulses bilaterally. Respiratory: Normal respiratory effort.  No  retractions. Lungs CTAB. Gastrointestinal: Soft and nontender. No distention. Musculoskeletal: No lower extremity tenderness nor edema.  Neurologic:  Normal speech and language. No gross focal neurologic deficits are appreciated.    ED Results / Procedures / Treatments   Labs (all labs ordered are listed, but only abnormal results are displayed) Labs Reviewed  BASIC METABOLIC PANEL - Abnormal; Notable for the following components:      Result Value   CO2 21 (*)    Glucose, Bld 101 (*)    Creatinine, Ser 1.07 (*)    Calcium 8.7 (*)    GFR, Estimated 54 (*)    All other components within normal limits  CBC - Abnormal; Notable for the following components:   Hemoglobin 15.1 (*)    HCT 46.5 (*)    All other components within normal limits  URINALYSIS, ROUTINE W REFLEX MICROSCOPIC - Abnormal; Notable for the following components:   Color, Urine YELLOW (*)    APPearance CLEAR (*)    Leukocytes,Ua MODERATE (*)    Bacteria, UA RARE (*)    All other components within normal limits  CBG MONITORING, ED  TROPONIN I (HIGH SENSITIVITY)  TROPONIN I (HIGH  SENSITIVITY)     EKG  ED ECG REPORT I, Blake Divine, the attending physician, personally viewed and interpreted this ECG.   Date: 12/09/2022  EKG Time: 15:40  Rate: 92  Rhythm: normal sinus rhythm  Axis: Normal  Intervals:none  ST&T Change: None  RADIOLOGY Chest x-ray reviewed and interpreted by me with no infiltrate, edema, or effusion.  PROCEDURES:  Critical Care performed: No  Procedures   MEDICATIONS ORDERED IN ED: Medications - No data to display   IMPRESSION / MDM / [place] / ED COURSE  I reviewed the triage vital signs and the nursing notes.                              77 y.o. female with past medical history of COPD, GERD, and anxiety who presents to the ED with acute onset difficulty breathing, palpitations, generalized numbness and weakness about an hour prior to arrival.  Patient's  presentation is most consistent with acute presentation with potential threat to life or bodily function.  Differential diagnosis includes, but is not limited to, COPD exacerbation, CHF, ACS, PE, pneumonia, pneumothorax, arrhythmia, electrode abnormality, anemia, AKI, anxiety.  Patient nontoxic-appearing and in no acute distress, vital signs are unremarkable.  She is not in any respiratory distress and maintaining oxygen saturations on room air.  She describes generalized numbness and weakness with no focal neurologic deficit on exam and I doubt stroke or other intracranial process.  EKG shows no evidence of arrhythmia or ischemia, will observe on cardiac monitor and check labs including troponin.  We will also check chest x-ray, lungs clear to auscultation bilaterally currently.  Labs are reassuring with mild AKI but no electrolyte abnormality, no significant anemia or leukocytosis noted.  2 sets of troponin are negative and patient continues to breathe comfortably on room air.  On reassessment, her lungs continue to be clear to auscultation bilaterally.  She reports improvement in symptoms and given reassuring workup, is appropriate for discharge home with PCP follow-up.  She was counseled to return to the ED for new or worsening symptoms, patient agrees with plan.      FINAL CLINICAL IMPRESSION(S) / ED DIAGNOSES   Final diagnoses:  Shortness of breath  Dizziness     Rx / DC Orders   ED Discharge Orders     None        Note:  This document was prepared using Dragon voice recognition software and may include unintentional dictation errors.   Blake Divine, MD 12/09/22 [phone]

## 2022-12-09 NOTE — ED Triage Notes (Signed)
Pt comes via EMS from home with c/o sob, weakness, numbness and dizziness. Pt had appt wit pulmonologist today for PFT. Pt went home and was ok and then had sudden onset of dizziness and numbness.  Pt states some sob. VSS per EMS

## 2022-12-13 DIAGNOSIS — R0602 Shortness of breath: Secondary | ICD-10-CM | POA: Diagnosis not present

## 2022-12-13 DIAGNOSIS — E042 Nontoxic multinodular goiter: Secondary | ICD-10-CM | POA: Diagnosis not present

## 2022-12-13 DIAGNOSIS — J449 Chronic obstructive pulmonary disease, unspecified: Secondary | ICD-10-CM | POA: Diagnosis not present

## 2022-12-13 DIAGNOSIS — Z09 Encounter for follow-up examination after completed treatment for conditions other than malignant neoplasm: Secondary | ICD-10-CM | POA: Diagnosis not present

## 2022-12-16 DIAGNOSIS — E042 Nontoxic multinodular goiter: Secondary | ICD-10-CM | POA: Diagnosis not present

## 2022-12-27 DIAGNOSIS — K224 Dyskinesia of esophagus: Secondary | ICD-10-CM | POA: Diagnosis not present

## 2022-12-27 DIAGNOSIS — R110 Nausea: Secondary | ICD-10-CM | POA: Diagnosis not present

## 2022-12-27 DIAGNOSIS — K219 Gastro-esophageal reflux disease without esophagitis: Secondary | ICD-10-CM | POA: Diagnosis not present

## 2022-12-27 DIAGNOSIS — K5904 Chronic idiopathic constipation: Secondary | ICD-10-CM | POA: Diagnosis not present

## 2022-12-27 DIAGNOSIS — K222 Esophageal obstruction: Secondary | ICD-10-CM | POA: Diagnosis not present

## 2022-12-27 DIAGNOSIS — R09A2 Foreign body sensation, throat: Secondary | ICD-10-CM | POA: Diagnosis not present

## 2023-02-16 ENCOUNTER — Other Ambulatory Visit: Payer: Self-pay | Admitting: Internal Medicine

## 2023-03-30 DIAGNOSIS — I700 Atherosclerosis of aorta: Secondary | ICD-10-CM | POA: Diagnosis not present

## 2023-03-30 DIAGNOSIS — R519 Headache, unspecified: Secondary | ICD-10-CM | POA: Diagnosis not present

## 2023-03-30 DIAGNOSIS — Z720 Tobacco use: Secondary | ICD-10-CM | POA: Diagnosis not present

## 2023-03-30 DIAGNOSIS — F3341 Major depressive disorder, recurrent, in partial remission: Secondary | ICD-10-CM | POA: Diagnosis not present

## 2023-03-30 DIAGNOSIS — J3089 Other allergic rhinitis: Secondary | ICD-10-CM | POA: Diagnosis not present

## 2023-03-30 DIAGNOSIS — R7309 Other abnormal glucose: Secondary | ICD-10-CM | POA: Diagnosis not present

## 2023-03-30 DIAGNOSIS — J449 Chronic obstructive pulmonary disease, unspecified: Secondary | ICD-10-CM | POA: Diagnosis not present

## 2023-03-30 DIAGNOSIS — M542 Cervicalgia: Secondary | ICD-10-CM | POA: Diagnosis not present

## 2023-04-06 DIAGNOSIS — H1010 Acute atopic conjunctivitis, unspecified eye: Secondary | ICD-10-CM | POA: Diagnosis not present

## 2023-04-06 DIAGNOSIS — F32A Depression, unspecified: Secondary | ICD-10-CM | POA: Diagnosis not present

## 2023-04-06 DIAGNOSIS — R7303 Prediabetes: Secondary | ICD-10-CM | POA: Diagnosis not present

## 2023-04-06 DIAGNOSIS — Z853 Personal history of malignant neoplasm of breast: Secondary | ICD-10-CM | POA: Diagnosis not present

## 2023-04-06 DIAGNOSIS — F17210 Nicotine dependence, cigarettes, uncomplicated: Secondary | ICD-10-CM | POA: Diagnosis not present

## 2023-04-06 DIAGNOSIS — M545 Low back pain, unspecified: Secondary | ICD-10-CM | POA: Diagnosis not present

## 2023-04-06 DIAGNOSIS — Z1331 Encounter for screening for depression: Secondary | ICD-10-CM | POA: Diagnosis not present

## 2023-04-06 DIAGNOSIS — J3089 Other allergic rhinitis: Secondary | ICD-10-CM | POA: Diagnosis not present

## 2023-04-06 DIAGNOSIS — E042 Nontoxic multinodular goiter: Secondary | ICD-10-CM | POA: Diagnosis not present

## 2023-04-08 DIAGNOSIS — H0100A Unspecified blepharitis right eye, upper and lower eyelids: Secondary | ICD-10-CM | POA: Diagnosis not present

## 2023-04-10 ENCOUNTER — Emergency Department: Payer: Medicare HMO

## 2023-04-10 ENCOUNTER — Other Ambulatory Visit: Payer: Self-pay

## 2023-04-10 ENCOUNTER — Emergency Department
Admission: EM | Admit: 2023-04-10 | Discharge: 2023-04-10 | Disposition: A | Discharge status: Home / Self Care | Payer: Medicare HMO | Attending: Emergency Medicine | Admitting: Emergency Medicine

## 2023-04-10 DIAGNOSIS — R42 Dizziness and giddiness: Secondary | ICD-10-CM | POA: Diagnosis not present

## 2023-04-10 DIAGNOSIS — J441 Chronic obstructive pulmonary disease with (acute) exacerbation: Secondary | ICD-10-CM | POA: Insufficient documentation

## 2023-04-10 DIAGNOSIS — F17200 Nicotine dependence, unspecified, uncomplicated: Secondary | ICD-10-CM | POA: Insufficient documentation

## 2023-04-10 DIAGNOSIS — I3139 Other pericardial effusion (noninflammatory): Secondary | ICD-10-CM | POA: Diagnosis not present

## 2023-04-10 DIAGNOSIS — R110 Nausea: Secondary | ICD-10-CM | POA: Diagnosis not present

## 2023-04-10 DIAGNOSIS — R0789 Other chest pain: Secondary | ICD-10-CM

## 2023-04-10 DIAGNOSIS — Z853 Personal history of malignant neoplasm of breast: Secondary | ICD-10-CM | POA: Insufficient documentation

## 2023-04-10 DIAGNOSIS — R079 Chest pain, unspecified: Secondary | ICD-10-CM | POA: Diagnosis not present

## 2023-04-10 DIAGNOSIS — R0902 Hypoxemia: Secondary | ICD-10-CM | POA: Diagnosis not present

## 2023-04-10 DIAGNOSIS — J439 Emphysema, unspecified: Secondary | ICD-10-CM | POA: Diagnosis not present

## 2023-04-10 DIAGNOSIS — R000 Tachycardia, unspecified: Secondary | ICD-10-CM | POA: Diagnosis not present

## 2023-04-10 LAB — CBC
HCT: 47.1 % — ABNORMAL HIGH (ref 36.0–46.0)
Hemoglobin: 15.3 g/dL — ABNORMAL HIGH (ref 12.0–15.0)
MCH: 31 pg (ref 26.0–34.0)
MCHC: 32.5 g/dL (ref 30.0–36.0)
MCV: 95.3 fL (ref 80.0–100.0)
Platelets: 236 10*3/uL (ref 150–400)
RBC: 4.94 MIL/uL (ref 3.87–5.11)
RDW: 13.3 % (ref 11.5–15.5)
WBC: 15.7 10*3/uL — ABNORMAL HIGH (ref 4.0–10.5)
nRBC: 0 % (ref 0.0–0.2)

## 2023-04-10 LAB — HEPATIC FUNCTION PANEL
ALT: 10 U/L (ref 0–44)
AST: 15 U/L (ref 15–41)
Albumin: 3.3 g/dL — ABNORMAL LOW (ref 3.5–5.0)
Alkaline Phosphatase: 55 U/L (ref 38–126)
Bilirubin, Direct: 0.4 mg/dL — ABNORMAL HIGH (ref 0.0–0.2)
Indirect Bilirubin: 0.9 mg/dL (ref 0.3–0.9)
Total Bilirubin: 1.3 mg/dL — ABNORMAL HIGH (ref 0.3–1.2)
Total Protein: 6.9 g/dL (ref 6.5–8.1)

## 2023-04-10 LAB — BASIC METABOLIC PANEL
Anion gap: 11 (ref 5–15)
BUN: 15 mg/dL (ref 8–23)
CO2: 25 mmol/L (ref 22–32)
Calcium: 8.7 mg/dL — ABNORMAL LOW (ref 8.9–10.3)
Chloride: 101 mmol/L (ref 98–111)
Creatinine, Ser: 0.86 mg/dL (ref 0.44–1.00)
GFR, Estimated: >60 mL/min (ref >60)
Glucose, Bld: 138 mg/dL — ABNORMAL HIGH (ref 70–99)
Potassium: 4.1 mmol/L (ref 3.5–5.1)
Sodium: 137 mmol/L (ref 135–145)

## 2023-04-10 LAB — LIPASE, BLOOD: Lipase: 23 U/L (ref 11–51)

## 2023-04-10 LAB — TROPONIN I (HIGH SENSITIVITY)
Troponin I (High Sensitivity): 4 ng/L (ref <18)
Troponin I (High Sensitivity): 4 ng/L (ref <18)

## 2023-04-10 MED ORDER — IPRATROPIUM-ALBUTEROL 0.5-2.5 (3) MG/3ML IN SOLN
3.0000 mL | Freq: Once | RESPIRATORY_TRACT | Status: AC
  Administered 2023-04-10: 3 mL via RESPIRATORY_TRACT
  Filled 2023-04-10: qty 3

## 2023-04-10 MED ORDER — FENTANYL CITRATE PF 50 MCG/ML IJ SOSY
50.0000 ug | PREFILLED_SYRINGE | Freq: Once | INTRAMUSCULAR | Status: AC
  Administered 2023-04-10: 50 ug via INTRAVENOUS
  Filled 2023-04-10: qty 1

## 2023-04-10 MED ORDER — KETOROLAC TROMETHAMINE 30 MG/ML IJ SOLN
15.0000 mg | Freq: Once | INTRAMUSCULAR | Status: AC
  Administered 2023-04-10: 15 mg via INTRAVENOUS
  Filled 2023-04-10: qty 1

## 2023-04-10 MED ORDER — LACTATED RINGERS IV BOLUS
1000.0000 mL | Freq: Once | INTRAVENOUS | Status: AC
  Administered 2023-04-10: 1000 mL via INTRAVENOUS

## 2023-04-10 MED ORDER — IOHEXOL 350 MG/ML SOLN
75.0000 mL | Freq: Once | INTRAVENOUS | Status: AC | PRN
  Administered 2023-04-10: 75 mL via INTRAVENOUS

## 2023-04-10 MED ORDER — PREDNISONE 50 MG PO TABS: 50.0000 mg | ORAL_TABLET | Freq: Every day | ORAL | 0 refills | Status: AC

## 2023-04-10 MED ORDER — METHYLPREDNISOLONE SODIUM SUCC 125 MG IJ SOLR
125.0000 mg | Freq: Once | INTRAMUSCULAR | Status: AC
  Administered 2023-04-10: 125 mg via INTRAVENOUS
  Filled 2023-04-10: qty 2

## 2023-04-10 NOTE — ED Provider Notes (Signed)
[facility] Provider Note    Event Date/Time   First MD Initiated Contact with Patient 04/10/23 0309     (approximate)   History   Chest Pain   HPI  Morgan Valdez is a 77 y.o. female who presents to the ED for evaluation of Chest Pain   I reviewed PCP visit from February.  History of COPD and smoking, GERD, breast cancer s/p lumpectomy and radiation.  Patient presents with severe chest pain and dyspnea on the past few hours.  Progressive and persistent over about 6 hours.  No syncope, but some mild nausea and dizziness.  No cough, fever.    Physical Exam   Triage Vital Signs: ED Triage Vitals  Enc Vitals Group     BP 04/10/23 0304 (!) 107/51     Pulse Rate 04/10/23 0304 (!) 110     Resp 04/10/23 0304 20     Temp 04/10/23 0304 98.1 F (36.7 C)     Temp Source 04/10/23 0304 Oral     SpO2 04/10/23 0304 97 %     Weight --      Height --      Head Circumference --      Peak Flow --      Pain Score 04/10/23 0254 9     Pain Loc --      Pain Edu? --      Excl. in [place]? --     Most recent vital signs: Vitals:   04/10/23 0304 04/10/23 0600  BP: (!) 107/51   Pulse: (!) 110 (!) 110  Resp: 20 14  Temp: 98.1 F (36.7 C)   SpO2: 97% 92%    General: Awake.  Hunched over, clutching her chest and looks uncomfortable. CV:  Good peripheral perfusion.  Tachycardic and regular Resp:  Mild tachypnea, diffuse wheezing slightly decreased airflow throughout.  No focal features. Abd:  No distention.  Soft MSK:  No deformity noted.  Neuro:  No focal deficits appreciated. Other:     ED Results / Procedures / Treatments   Labs (all labs ordered are listed, but only abnormal results are displayed) Labs Reviewed  CBC - Abnormal; Notable for the following components:      Result Value   WBC 15.7 (*)    Hemoglobin 15.3 (*)    HCT 47.1 (*)    All other components within normal limits  HEPATIC FUNCTION PANEL - Abnormal; Notable for the following  components:   Albumin 3.3 (*)    Total Bilirubin 1.3 (*)    Bilirubin, Direct 0.4 (*)    All other components within normal limits  BASIC METABOLIC PANEL - Abnormal; Notable for the following components:   Glucose, Bld 138 (*)    Calcium 8.7 (*)    All other components within normal limits  LIPASE, BLOOD  TROPONIN I (HIGH SENSITIVITY)  TROPONIN I (HIGH SENSITIVITY)    EKG Sinus rhythm with a rate of 114 bpm.  Normal axis and intervals.  Nonspecific ST changes inferiorly without STEMI or reciprocal changes.  Sinus tach  RADIOLOGY CXR interpreted by me without evidence of acute cardiopulmonary pathology.  Official radiology report(s): DG Chest Portable 1 View  Result Date: 04/10/2023 CLINICAL DATA:  Chest pain.  History of COPD. EXAM: PORTABLE CHEST 1 VIEW COMPARISON:  Radiographs 12/09/2022 FINDINGS: Stable cardiomediastinal silhouette. Aortic atherosclerotic calcification. Chronic bronchitic changes. No focal consolidation, pleural effusion, or pneumothorax. No displaced rib fractures. IMPRESSION: No active disease.  Chronic bronchitic changes.  Electronically Signed   By: Minerva Fester M.D.   On: 04/10/2023 03:59    PROCEDURES and INTERVENTIONS:  .1-3 Lead EKG Interpretation  Performed by: Delton Prairie, MD Authorized by: Delton Prairie, MD     Interpretation: abnormal     ECG rate:  104   ECG rate assessment: tachycardic     Rhythm: sinus tachycardia     Ectopy: none     Conduction: normal     Medications  ipratropium-albuterol (DUONEB) 0.5-2.5 (3) MG/3ML nebulizer solution 3 mL (3 mLs Nebulization Given 04/10/23 0402)  lactated ringers bolus 1,000 mL (0 mLs Intravenous Stopped 04/10/23 0623)  methylPREDNISolone sodium succinate (SOLU-MEDROL) 125 mg/2 mL injection 125 mg (125 mg Intravenous Given 04/10/23 0359)  fentaNYL (SUBLIMAZE) injection 50 mcg (50 mcg Intravenous Given 04/10/23 0357)  iohexol (OMNIPAQUE) 350 MG/ML injection 75 mL (75 mLs Intravenous Contrast Given 04/10/23  0631)  ketorolac (TORADOL) 30 MG/ML injection 15 mg (15 mg Intravenous Given 04/10/23 0625)     IMPRESSION / MDM / ASSESSMENT AND PLAN / ED COURSE  I reviewed the triage vital signs and the nursing notes.  Differential diagnosis includes, but is not limited to, ACS, PTX, PNA, muscle strain/spasm, PE, dissection, anxiety, pleural effusion  {Patient presents with symptoms of an acute illness or injury that is potentially life-threatening.  77 year old presents with rapid onset severe chest pain, tachycardia and evidence of a COP exacerbation.  Intermittent spasmodic pain is possibly muscular in etiology.  Her EKG is nonischemic and 2 negative troponins.  Essentially normal metabolic panel.  CBC with mild leukocytosis, but I doubt infectious etiology of her symptoms.  Normal lipase and clear CXR.  Awaiting CTA chest around the time of signout.  If this is reassuring then she may be suitable for outpatient management.  Clinical Course as of 04/10/23 0708  Wynelle Link Apr 10, 2023  1610 Reassessed.  Looks little bit more comfortable after the fentanyl.  Starting breathing treatments. [DS]  9604 Went to reassess. Over in CT. Boyfriend at bedside, said she's doing much better [DS]  0707 Reassessed.  Feeling much better and looks well.  She reports resolution of pain after the Toradol.  She is appreciative.  We discussed pending CTA chest and the possibility of outpatient management.  She is agreeable. [DS]    Clinical Course User Index [DS] Delton Prairie, MD     FINAL CLINICAL IMPRESSION(S) / ED DIAGNOSES   Final diagnoses:  Other chest pain  COPD exacerbation (HCC)     Rx / DC Orders   ED Discharge Orders          Ordered    predniSONE (DELTASONE) 50 MG tablet  Daily        04/10/23 0707             Note:  This document was prepared using Dragon voice recognition software and may include unintentional dictation errors.   Delton Prairie, MD 04/10/23 [phone]

## 2023-04-10 NOTE — ED Triage Notes (Signed)
Pt to ED via [facility] c/o chest pain that started Thursday and has been progressively getting worse. Pt describes pain as "sharp spasms" all over chest. Denies SOB. N/V, fevers

## 2023-04-10 NOTE — ED Provider Notes (Signed)
-----------------------------------------   6:58 AM on 04/10/2023 -----------------------------------------  Blood pressure (!) 107/51, pulse (!) 110, temperature 98.1 F (36.7 C), temperature source Oral, resp. rate 14, SpO2 92 %.  Assuming care from Dr. Katrinka Blazing.  In short, Morgan Valdez is a 77 y.o. female with a chief complaint of Chest Pain .  Refer to the original H&P for additional details.  The current plan of care is to follow-up CTA chest results, plan for d/c home if negative.  ----------------------------------------- 8:37 AM on 04/10/2023 ----------------------------------------- CTA chest negative for PE, does show small pericardial effusion that could be related to pericarditis.  Some of patient's symptoms do sound consistent with pericarditis given pleuritic pain that changes with position.  She states she cannot take NSAIDs due to GI bleeds in the past, but had been prescribed steroids by Dr. Katrinka Blazing for COPD which should also help with potential pericarditis.  She was referred to cardiology for follow-up, counseled to return to the ED for new or worsening symptoms.  Patient agrees with plan.    Chesley Noon, MD 04/10/23 [phone]

## 2023-04-14 ENCOUNTER — Ambulatory Visit: Payer: Medicare HMO | Attending: Cardiology | Admitting: Cardiology

## 2023-04-14 ENCOUNTER — Telehealth: Payer: Self-pay

## 2023-04-14 ENCOUNTER — Ambulatory Visit: Payer: Medicare HMO | Admitting: Radiation Oncology

## 2023-04-14 ENCOUNTER — Encounter: Payer: Self-pay | Admitting: Cardiology

## 2023-04-14 VITALS — BP 130/62 | HR 86 | Ht 67.0 in | Wt 144.1 lb

## 2023-04-14 DIAGNOSIS — E782 Mixed hyperlipidemia: Secondary | ICD-10-CM | POA: Diagnosis not present

## 2023-04-14 DIAGNOSIS — F17210 Nicotine dependence, cigarettes, uncomplicated: Secondary | ICD-10-CM | POA: Diagnosis not present

## 2023-04-14 DIAGNOSIS — J449 Chronic obstructive pulmonary disease, unspecified: Secondary | ICD-10-CM | POA: Diagnosis not present

## 2023-04-14 DIAGNOSIS — R072 Precordial pain: Secondary | ICD-10-CM | POA: Diagnosis not present

## 2023-04-14 DIAGNOSIS — R0602 Shortness of breath: Secondary | ICD-10-CM | POA: Diagnosis not present

## 2023-04-14 DIAGNOSIS — I3139 Other pericardial effusion (noninflammatory): Secondary | ICD-10-CM | POA: Diagnosis not present

## 2023-04-14 DIAGNOSIS — E785 Hyperlipidemia, unspecified: Secondary | ICD-10-CM | POA: Insufficient documentation

## 2023-04-14 MED ORDER — METOPROLOL TARTRATE 100 MG PO TABS: 100.0000 mg | ORAL_TABLET | Freq: Once | ORAL | 0 refills | Status: DC

## 2023-04-14 NOTE — Progress Notes (Signed)
Primary Care Provider: Barbette Reichmann, MD - [facility] Cardiologist: None Electrophysiologist: None  Clinic Note: Chief Complaint  Patient presents with   New Patient (Initial Visit)    Ref by Dr. 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 Patient shortness of breath and bilateral LE edema for the past 3 weeks. Medications reviewed by the patient verbally.    ===================================  ASSESSMENT/PLAN   Problem List Items Addressed This Visit       Cardiology Problems   Pericardial effusion    Pericardial effusion noted on CT scan.  She did have some sharp atypical chest discomfort but not having it anymore. Partly because of her exertional dyspnea and activity feeling some intermittent episodes of chest comfort will check 2D echo to ensure that there is not growing in size.      Relevant Medications   metoprolol tartrate (LOPRESSOR) 100 MG tablet   Other Relevant Orders   EKG 12-Lead   CT CORONARY MORPH W/CTA COR W/SCORE W/CA W/CM &/OR WO/CM   ECHOCARDIOGRAM COMPLETE   Hyperlipidemia (Chronic)    Her lipids from May 2024 notably worse than January 2024.  Triglycerides little better but TC and LDL are notably worse.  She initially declined any therapy, but now given the concern for chest pain, we will evaluate for presence of coronary artery disease with Coronary CTA.  Based on those results may want to be more aggressive.      Relevant Medications   metoprolol tartrate (LOPRESSOR) 100 MG tablet     Other   Precordial pain - Primary    Pretty significant episode of pain although she ruled out for MI she is still having some mild symptoms in her chest now with associated exertional dyspnea.  She had very poorly controlled lipids with r chronic smoking as risk factors.  She would not build to walk on a treadmill because of her back and knee pain.  She very much did not tolerate  Lexiscan back in 2017 with profound dyspnea and near syncope.  This leaves also Coronary CTA is the best option for evaluating for coronary artery disease.  The added benefit is that if nonocclusive disease found, we have more impetus to initiate more aggressive management of her lipids.  This stated it would not be available from a Myoview.  Plan: Coronary CTA      Relevant Orders   EKG 12-Lead    ===================================  HPI:    Morgan Valdez is a 77 y.o. 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  She was noticing feeling more depressed lately.  Chronic low back pain and knee pain.  Now on gabapentin for pain relief.  Lipids were checked and LDL is quite high.  Patient declined initiating treatment.  Wanted to try to "give lifestyle modification try ".=> Not mentioned in this note, but she was eventually switched from Wellbutrin 300 mg daily to Zoloft 50 mg, no comment of chest tightness, edema or any symptoms.  Recent Hospitalizations:  12/09/2022-[facility] ER by EMS for shortness of breath.  Normal chest x-ray, normal troponins. ->  Pulmonologist had just indicated that her "breathing was worsened.  Denied chest pain. Seen by Dayton Bailiff, PA on 12/13/2022 at [facility] for ER follow-up.  Differential was anxiety/panic attack versus aggravated thyroid disease.  Recommended continued follow-up eith pulmonary 04/10/2023-[facility] ER With Complaints of Chest Pain.  Noted severe chest pain and dyspnea for few hours.  Progressive and persistent over 6 hours.  Mild nausea and  dizziness.  Noted to be tachycardic 110 bpm.  EKG with nonspecific changes. => Thought to be COPD exacerbation.  Negative troponin x 2. Admitted to CTA Chest Ordered Feeling better after initial treatment and discharged home with prednisone taper  Reviewed  CV studies:    The following studies were reviewed today: (if available, images/films reviewed: From Epic Chart or Care Everywhere) Cardiolite 07/24/20 [facility] Cardiology): NORMAL. Normal EF.  Echo 07/24/20: [facility]) NORMAL LEFT VENTRICULAR SYSTOLIC FUNCTION  NORMAL RIGHT VENTRICULAR SYSTOLIC FUNCTION  TRIVIAL REGURGITATION NOTED (See above)  NO VALVULAR STENOSIS  TRIVIAL MR, TR  EF 55-60%  CTA Chest-PE 04/10/2023: Small pericardial effusion-reader indicates correlate for symptoms of pericarditis).  No PE.  Enlarged heterogeneous thyroid-progressive in 2019.  Some extension into the right mainstem.  Recommend thyroid ultrasound.  Emphysema and airway thickening also noted.  Interval History:   Morgan Valdez presents here today because of her ER visit.  She states that she has not had any more episodes of chest discomfort that she had taking her to the ER.  She said that was horrible pain last about 6 to 8 hours.  She described it as a pressure-like sensation that will build up in her upper abdomen and lower chest and we cannot [place] flow.  It would radiate down her arm at times.  He felt dizzy lightheaded.  She said that since starting on she believes Neurontin, things improved some. The chest pain she had not changed at all with any type of activity.  Not made worse with improved with any particular activity or treatment joints.  It resolved spontaneously and has not recurred.  She has not had any further episodes of chest pain like the episode.  She just had some occasional brief episodes of tightness lasting few seconds.  She also notes of exertional dyspnea depending what she does.  Over the last couple weeks as well as [place]  with swelling in her ankles and maybe little more shortness of breath.  Denies any real PND/orthopnea but says that she has been sleeping on 2 pillows for years-not sure if it is for breathing or GERD.  In addition to the other symptoms she occasionally has like a spasm-like sensation in her left side of her chest underneath her left breast radiating to the axilla.  This happens with certain movements or deep breaths.  She was told that it was "nerve pain from her surgery".  At this point she denies any exertional chest pain or pressure.  Stable exertional dyspnea which she has contributed to her emphysema.  She denies any rapid irregular heartbeats or palpitations.  No syncope or near syncope.  No TIA or emesis fugax.  No claudication.   REVIEWED OF SYSTEMS   Review of Systems  Constitutional:  Positive for malaise/fatigue (Wears out easily.  May be more related to MSK pain than anything else.). Negative for weight loss.  HENT:  Negative for congestion and nosebleeds.   Respiratory:  Positive for cough (On occasion.). Negative for shortness of breath (DOE noted  above) and wheezing.   Cardiovascular:  Positive for leg swelling.  Gastrointestinal:  Positive for heartburn. Negative for blood in stool and melena.  Genitourinary:  Negative for dysuria and hematuria.  Musculoskeletal:  Positive for back pain, joint pain and neck pain.  Neurological:  Positive for dizziness. Negative for focal weakness and weakness.  Psychiatric/Behavioral:  Positive for depression (Was switched from Wellbutrin to walk to 50 mg daily.  No change in symptoms.). Negative for memory loss. The patient is nervous/anxious. The patient does not have insomnia.     I have reviewed and (if needed) personally updated the patient's problem list, medications, allergies, past medical and surgical history, social and family history.   PAST MEDICAL HISTORY   Past Medical History:  Diagnosis Date   Allergic state    Anxiety     Arthritis    Barrett's esophagus    Breast cancer (HCC)    Left   Chronic cough    COPD (chronic obstructive pulmonary disease) (HCC)    Depression    Duodenitis    Emphysema of lung (HCC)    Esophageal motility disorder    Family history of adrenal cancer    Family history of brain cancer    Family history of colon cancer    Family history of lung cancer    Family history of melanoma    Gastritis    GERD (gastroesophageal reflux disease)    Headache    migraines - 3-4x/yr   Osteoporosis, post-menopausal    Personal history of radiation therapy 2022   breast cancer   Pre-diabetes    Tobacco abuse disorder    Varicose veins of legs     PAST SURGICAL HISTORY   Past Surgical History:  Procedure Laterality Date   APPENDECTOMY     BREAST BIOPSY Left 01/02/2021   [facility] WITH FEATURES SUGGESTIVE OF SOLID PSEUDOPAPILLARY CARCINOMA   BREAST LUMPECTOMY Left 12/2020   SOLID PAPILLARY CARCINOMA, 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   BREAST LUMPECTOMY,RADIO FREQ LOCALIZER,AXILLARY SENTINEL LYMPH NODE BIOPSY Left 01/28/2021   Procedure: BREAST LUMPECTOMY,RADIO FREQ LOCALIZER,AXILLARY SENTINEL LYMPH NODE BIOPSY;  Surgeon: Carolan Shiver, MD;  Location: [facility] ORS;  Service: General;  Laterality: Left;   CATARACT EXTRACTION W/PHACO Right 01/03/2017   Procedure: CATARACT EXTRACTION PHACO AND INTRAOCULAR LENS PLACEMENT (IOC)  Right;  Surgeon: Sherald Hess, MD;  Location: [facility];  Service: Ophthalmology;  Laterality: Right;   CATARACT EXTRACTION W/PHACO Left 01/17/2017   Procedure: CATARACT EXTRACTION PHACO AND INTRAOCULAR LENS PLACEMENT (IOC);  Surgeon: Sherald Hess, MD;  Location: [facility];  Service: Ophthalmology;  Laterality: Left;  left   CHOLECYSTECTOMY     COLONOSCOPY WITH PROPOFOL N/A 04/06/2018   Procedure: COLONOSCOPY WITH PROPOFOL;  Surgeon: Christena Deem, MD;  Location: [facility] END[facility]COPY;  Service: Endoscopy;  Laterality: N/A;    COLONOSCOPY WITH PROPOFOL N/A 06/08/2021   Procedure: COLONOSCOPY WITH PROPOFOL;  Surgeon: Toledo, Boykin Nearing, MD;  Location: [facility] ENDOSCOPY;  Service: Gastroenterology;  Laterality: N/A;   ESOPHAGOGASTRODUODENOSCOPY N/A 09/26/2018   Procedure: ESOPHAGOGASTRODUODENOSCOPY (EGD);  Surgeon: Christena Deem, MD;  Location: [facility] ENDOSCOPY;  Service: Endoscopy;  Laterality: N/A;   ESOPHAGOGASTRODUODENOSCOPY (EGD) WITH PROPOFOL N/A 10/14/2015   Procedure: ESOPHAGOGASTRODUODENOSCOPY (EGD) WITH PROPOFOL;  Surgeon: Christena Deem, MD;  Location: [facility] ENDOSCOPY;  Service: Endoscopy;  Laterality: N/A;   EYE SURGERY Bilateral 2019   cataracts    FRACTURE SURGERY     HERNIA REPAIR     MEDICATIONS/ALLERGIES   Current Meds  Medication Sig   anastrozole (ARIMIDEX) 1 MG tablet TAKE 1 TABLET EVERY DAY   cetirizine (ZYRTEC) 10 MG tablet Take 10 mg by mouth daily as needed for allergies.   clotrimazole-betamethasone (LOTRISONE) cream Apply 1 application  topically 2 (two) times daily.   esomeprazole (NEXIUM) 40 MG capsule Take 40 mg by mouth 2 (two) times daily.   gabapentin (NEURONTIN) 100 MG capsule Take 100 mg by mouth 3 (three) times daily. Two capsules 3 times a day as needed   ondansetron (ZOFRAN) 4 MG tablet Take 4 mg by mouth 2 (two) times daily as needed for nausea/vomiting.   sertraline (ZOLOFT) 50 MG tablet Take 50 mg by mouth daily.   SPIRIVA HANDIHALER 18 MCG inhalation capsule Place 1 capsule into inhaler and inhale in the morning.   topiramate (TOPAMAX) 25 MG tablet Take 25 mg by mouth daily as needed.    Allergies  Allergen Reactions   Tape Dermatitis    If stayed on for a long time   Nicotine     Nicotine patch and causes blisters and red   Bacitracin Rash    SOCIAL HISTORY/FAMILY HISTORY   Reviewed in Epic:   Social History   Tobacco Use   Smoking status: Some Days    Packs/day: 0.50    Years: 50.00    Additional pack years: 0.00    Total pack years: 25.00    Types:  Cigarettes   Smokeless tobacco: Never  Vaping Use   Vaping Use: Never used  Substance Use Topics   Alcohol use: Yes    Alcohol/week: 1.0 standard drink of alcohol    Types: 1 Glasses of wine per week    Comment: rare - Holidays   Drug use: No   Social History   Social History Narrative   Lives in [place]; 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 adrenal gland; lung.    Dementia Father    Cancer Sister        colon cancer- 40s to liver.    Cancer Maternal Grandmother        brain cancer   Diabetes Maternal Grandfather    Stroke Maternal Grandfather    Rectal cancer Daughter        at 60 years; survived.    Melanoma Grandson        dx 22 on neck   Breast cancer Neg Hx   - No Fam Hx of CAD  OBJCTIVE -PE, EKG, labs   Wt Readings from Last 3 Encounters:  04/14/23 144 lb 2 oz (65.4 kg)  12/09/22 156 lb 8.4 oz (71 kg)  11/16/22 155 lb 12.8 oz (70.7 kg)    Physical Exam: BP 130/62 (BP Location: Right Arm, Patient Position: Sitting, Cuff Size: Normal)   Pulse 86   Ht 5\' 7"  (1.702 m)   Wt 144 lb 2 oz (65.4 kg)   SpO2 95%   BMI 22.57 kg/m  Physical Exam Vitals reviewed.  Constitutional:      General: She is not in acute distress.    Appearance: Normal appearance. She is normal weight. She is not ill-appearing or toxic-appearing.     Comments: Well-nourished, well-groomed.  HENT:     Head: Normocephalic and atraumatic.  Neck:     Vascular: No carotid bruit or JVD.  Cardiovascular:     Rate  and Rhythm: Normal rate and regular rhythm. Frequent Extrasystoles are present.    Chest Wall: PMI is not displaced.     Pulses: Normal pulses.     Heart sounds: S1 normal and S2 normal. No murmur heard.    No friction rub. No gallop.  Pulmonary:     Effort: Pulmonary effort is normal. No respiratory distress.     Breath sounds: Normal breath sounds. No  wheezing, rhonchi or rales.  Chest:     Chest wall: No tenderness (Not tender but she is able to point to an area just above the manubrium she feels the discomfort.).  Musculoskeletal:        General: Swelling (Trivial ankle swelling) present. Normal range of motion.     Cervical back: Normal range of motion and neck supple.  Skin:    General: Skin is warm.  Neurological:     General: No focal deficit present.     Mental Status: She is alert and oriented to person, place, and time.     Cranial Nerves: No cranial nerve deficit.  Psychiatric:     Comments: A little anxious but otherwise normal affect.      Adult ECG Report  Rate: 86;  Rhythm: normal sinus rhythm, premature atrial contractions (PAC), and PACs and bigeminy pattern.  Otherwise normal axis, intervals and durations. ;   Narrative Interpretation: PVCs with bigeminy.  Recent Labs:  labs - 03/30/23   Lipid Panel w/calc LDL Component 03/30/23 11/24/22 06/04/22 02/01/22 10/01/21 06/12/21  Cholesterol, Total 273 High  213 High  196 226 High  205 High  201 High   Triglyceride 140 220 High  113 114 127 132  HDL (High Density Lipoprotein) Cholesterol 56.3 60.2 51.8 63.0 58.4 53.5  LDL Calculated 189 High  109 122 140 High      Hemoglobin A1C Component 03/30/23 11/24/22 06/04/22 02/01/22 10/01/21 06/12/21  Hemoglobin A1C 6.2 High  6.3 High  6.3 High  6.2 High      No results found for: "CHOL", "HDL", "LDLCALC", "LDLDIRECT", "TRIG", "CHOLHDL" Lab Results  Component Value Date   CREATININE 0.86 04/10/2023   BUN 15 04/10/2023   NA 137 04/10/2023   K 4.1 04/10/2023   CL 101 04/10/2023   CO2 25 04/10/2023       Latest Ref Rng & Units 04/10/2023    3:37 AM 12/09/2022    4:01 PM 11/16/2022    9:21 AM  CBC  WBC 4.0 - 10.5 K/uL 15.7  10.1  8.9   Hemoglobin 12.0 - 15.0 g/dL 16.1  09.6  04.5   Hematocrit 36.0 - 46.0 % 47.1  46.5  47.9   Platelets 150 - 400 K/uL 236  329  273     No results found for: "HGBA1C" No results found  for: "TSH"  ================================================== I spent a total of 31 minutes with the patient spent in direct patient consultation.  Additional time spent with chart review  / charting (studies, outside notes, etc): 32 min Total Time: 63 min  Current medicines are reviewed at length with the patient today.  (+/- concerns) none  Notice: This dictation was prepared with Dragon dictation along with smart phrase technology. Any transcriptional errors that result from this process are unintentional and may not be corrected upon review.   Studies Ordered:  Orders Placed This Encounter  Procedures   CT CORONARY MORPH W/CTA COR W/SCORE W/CA W/CM &/OR WO/CM   EKG 12-Lead   ECHOCARDIOGRAM COMPLETE   Meds ordered  this encounter  Medications   metoprolol tartrate (LOPRESSOR) 100 MG tablet    Sig: Take 1 tablet (100 mg total) by mouth once for 1 dose. Take 2 hours prior to test.    Dispense:  1 tablet    Refill:  0    Patient Instructions / Medication Changes & Studies & Tests Ordered   Patient Instructions  Medication Instructions:  No changes continue taking medications as prescribed. *If you need a refill on your cardiac medications before your next appointment, please call your pharmacy*   Lab Work: none If you have labs (blood work) drawn today and your tests are completely normal, you will receive your results only by: MyChart Message (if you have MyChart) OR A paper copy in the mail If you have any lab test that is abnormal or we need to change your treatment, we will call you to review the results.   Testing/Procedures: Your physician has requested that you have an echocardiogram. Echocardiography is a painless test that uses sound waves to create images of your heart. It provides your doctor with information about the size and shape of your heart and how well your heart's chambers and valves are working.   You may receive an ultrasound enhancing agent through  an IV if needed to better visualize your heart during the echo. This procedure takes approximately one hour.  There are no restrictions for this procedure.  This will take place at [address] [address], [place] [address]      Your cardiac CT will be scheduled at one of the below locations:    [facility] [address] [place], [place] [address] [phone]    Your next appointment:   3 month(s)  Provider:   Bryan Lemma, MD      Marykay Lex, MD, MS Bryan Lemma, M.D., M.S. Interventional Cardiologist  [facility]   [address] [place], [place]  [address] [phone]           Fax [phone]    Thank you for choosing [facility] in [place]!!

## 2023-04-14 NOTE — Assessment & Plan Note (Signed)
Pericardial effusion noted on CT scan.  She did have some sharp atypical chest discomfort but not having it anymore. Partly because of her exertional dyspnea and activity feeling some intermittent episodes of chest comfort will check 2D echo to ensure that there is not growing in size.

## 2023-04-14 NOTE — Assessment & Plan Note (Signed)
Her lipids from May 2024 notably worse than January 2024.  Triglycerides little better but TC and LDL are notably worse.  She initially declined any therapy, but now given the concern for chest pain, we will evaluate for presence of coronary artery disease with Coronary CTA.  Based on those results may want to be more aggressive.

## 2023-04-14 NOTE — Assessment & Plan Note (Signed)
Pretty significant episode of pain although she ruled out for MI she is still having some mild symptoms in her chest now with associated exertional dyspnea.  She had very poorly controlled lipids with r chronic smoking as risk factors.  She would not build to walk on a treadmill because of her back and knee pain.  She very much did not tolerate Lexiscan back in 2017 with profound dyspnea and near syncope.  This leaves also Coronary CTA is the best option for evaluating for coronary artery disease.  The added benefit is that if nonocclusive disease found, we have more impetus to initiate more aggressive management of her lipids.  This stated it would not be available from a Myoview.  Plan: Coronary CTA

## 2023-04-14 NOTE — Telephone Encounter (Signed)
Transition Care Management Unsuccessful Follow-up Telephone Call  Date of discharge and from where:  04/10/2023 [facility]  Attempts:  1st Attempt  Reason for unsuccessful TCM follow-up call:  Unable to leave message  [facility]  [facility] Population Health Community Resource Care Guide   [email or web address]  ?? [phone]   [email or web address]: [email or web address]

## 2023-04-14 NOTE — Patient Instructions (Signed)
Medication Instructions:  No changes continue taking medications as prescribed. *If you need a refill on your cardiac medications before your next appointment, please call your pharmacy*   Lab Work: none If you have labs (blood work) drawn today and your tests are completely normal, you will receive your results only by: MyChart Message (if you have MyChart) OR A paper copy in the mail If you have any lab test that is abnormal or we need to change your treatment, we will call you to review the results.   Testing/Procedures: Your physician has requested that you have an echocardiogram. Echocardiography is a painless test that uses sound waves to create images of your heart. It provides your doctor with information about the size and shape of your heart and how well your heart's chambers and valves are working.   You may receive an ultrasound enhancing agent through an IV if needed to better visualize your heart during the echo. This procedure takes approximately one hour.  There are no restrictions for this procedure.  This will take place at [address] ([facility] [address], [place] [address]      Your cardiac CT will be scheduled at one of the below locations:    [facility] [address] [place], [place] [address] [phone]  Someone will call to schedule this appointment with you.   If scheduled at [facility] or [facility], please arrive 15 mins early for check-in and test prep.   Please follow these instructions carefully (unless otherwise directed):  Hold all erectile dysfunction medications at least 3 days (72 hrs) prior to test. (Ie viagra, cialis, sildenafil, tadalafil, etc) We will administer nitroglycerin during this exam.   On the Night Before the Test: Be sure to Drink plenty of water. Do not consume any caffeinated/decaffeinated beverages or  chocolate 12 hours prior to your test. Do not take any antihistamines 12 hours prior to your test. Zyrtec   On the Day of the Test: Drink plenty of water until 1 hour prior to the test. Do not eat any food 1 hour prior to test. You may take your regular medications prior to the test.  Take 100 mg metoprolol (Lopressor) two hours prior to test. If you take Furosemide/Hydrochlorothiazide/Spironolactone, please HOLD on the morning of the test. FEMALES- please wear underwire-free bra if available, avoid dresses & tight clothing       After the Test: Drink plenty of water. After receiving IV contrast, you may experience a mild flushed feeling. This is normal. On occasion, you may experience a mild rash up to 24 hours after the test. This is not dangerous. If this occurs, you can take Benadryl 25 mg and increase your fluid intake. If you experience trouble breathing, this can be serious. If it is severe call 911 IMMEDIATELY. If it is mild, please call our office. If you take any of these medications: Glipizide/Metformin, Avandament, Glucavance, please do not take 48 hours after completing test unless otherwise instructed.  We will call to schedule your test 2-4 weeks out understanding that some insurance companies will need an authorization prior to the service being performed.   For non-scheduling related questions, please contact the cardiac imaging nurse navigator should you have any questions/concerns: Rockwell Alexandria, Cardiac Imaging Nurse Navigator Larey Brick, Cardiac Imaging Nurse Navigator [facility] Direct Office Dial: [phone]   For scheduling needs, including cancellations and rescheduling, please call [place],  [phone].    Follow-Up: At [facility], you and your health needs are our priority.  As part of our continuing mission to provide you with exceptional heart care, we have created designated Provider Care Teams.  These Care  Teams include your primary Cardiologist (physician) and Advanced Practice Providers (APPs -  Physician Assistants and Nurse Practitioners) who all work together to provide you with the care you need, when you need it.  We recommend signing up for the patient portal called "MyChart".  Sign up information is provided on this After Visit Summary.  MyChart is used to connect with patients for Virtual Visits (Telemedicine).  Patients are able to view lab/test results, encounter notes, upcoming appointments, etc.  Non-urgent messages can be sent to your provider as well.   To learn more about what you can do with MyChart, go to [email or web address].    Your next appointment:   3 month(s)  Provider:   Bryan Lemma, MD

## 2023-04-15 ENCOUNTER — Telehealth: Payer: Self-pay

## 2023-04-15 NOTE — Telephone Encounter (Signed)
Transition Care Management Unsuccessful Follow-up Telephone Call  Date of discharge and from where:  04/10/2023 [facility]  Attempts:  2nd Attempt  Reason for unsuccessful TCM follow-up call:  Unable to leave message  Ladawna Walgren [facility]  [facility] Population Health Community Resource Care Guide   [email or web address]  ?? [phone]   Website: [email or web address]  [email or web address]

## 2023-04-18 ENCOUNTER — Telehealth: Payer: Self-pay | Admitting: Cardiology

## 2023-04-18 NOTE — Telephone Encounter (Signed)
Pt c/o of Chest Pain: STAT if CP now or developed within 24 hours  1. Are you having CP right now?   Yes  2. Are you experiencing any other symptoms (ex. SOB, nausea, vomiting, sweating)?   A little nausea, wakes up drenched in sweat and SOB  3. How long have you been experiencing CP?   Over a week  4. Is your CP continuous or coming and going?   Coming and going  5. Have you taken Nitroglycerin?  No  Patient stated she has been taking ibuprofen for the chest pain but it is not helping enough.  Patient stated she would like to get pain medicine.  Patient stated she had been taking Prednisone which had helped. ?

## 2023-04-19 NOTE — Telephone Encounter (Signed)
I am really reluctant to treat and let know what is going on.  But I think we can treat with colchicine, and high-dose ibuprofen at least until I see her echocardiogram.  If the echo confirms pericardial effusion, then we will probably continue;  Lets start with:   Colchicine 0.6 mg p.o. twice daily for 2 weeks then reduce to once daily to complete 2 months. Colchicine 0.5 mg p.o. twice daily x 2 weeks, then reduce to 1 tablet daily for 6 additional weeks.;  Dispense #70, no refill Ibuprofen 600 mg 3 times daily x 3 days, 600 mg twice daily x 3 days, 400 mg twice daily x 3 days, 200 mg twice daily x 3 days With each dose of ibuprofen ensure that she has eaten at least a substantial snack, and has a whole glass of water.   If the echocardiogram does not show pericardial effusion, we can cut the dosing shorter. \ Bryan Lemma, MD

## 2023-04-20 NOTE — Telephone Encounter (Signed)
Patient called to follow up on her phone call from Monday.  She would like a callback.

## 2023-04-21 ENCOUNTER — Other Ambulatory Visit: Payer: Self-pay

## 2023-04-21 MED ORDER — COLCHICINE 0.6 MG PO TABS: ORAL_TABLET | ORAL | 0 refills | Status: DC

## 2023-04-21 MED ORDER — IBUPROFEN 200 MG PO CAPS: ORAL_CAPSULE | ORAL | 0 refills | Status: AC

## 2023-04-21 NOTE — Telephone Encounter (Signed)
Spoke to patient and informed her of the provider's recommendations as follows:  "I am really reluctant to treat and let know what is going on.  But I think we can treat with colchicine, and high-dose ibuprofen at least until I see her echocardiogram.  If the echo confirms pericardial effusion, then we will probably continue;   Lets start with:     Colchicine 0.6 mg p.o. twice daily for 2 weeks then reduce to once daily to complete 2 months.  Ibuprofen 600 mg 3 times daily x 3 days, 600 mg twice daily x 3 days, 400 mg twice daily x 3 days, 200 mg twice daily x 3 days ? With each dose of ibuprofen ensure that she has eaten at least a substantial snack, and has a whole glass of water."  Message sent to MyChart as well as requested by patient

## 2023-04-22 ENCOUNTER — Ambulatory Visit: Payer: Medicare HMO | Attending: Cardiology

## 2023-04-22 ENCOUNTER — Telehealth: Payer: Self-pay | Admitting: Cardiology

## 2023-04-22 DIAGNOSIS — I3139 Other pericardial effusion (noninflammatory): Secondary | ICD-10-CM

## 2023-04-22 NOTE — Telephone Encounter (Signed)
Pt c/o medication issue:  1. Name of Medication:   colchicine 0.6 MG tablet    2. How are you currently taking this medication (dosage and times per day)?   3. Are you having a reaction (difficulty breathing--STAT)?   4. What is your medication issue? Patient states that the pharmacy that this was sent to is showing no record of this and she would like for this to be sent once more.

## 2023-04-22 NOTE — Telephone Encounter (Signed)
I called [facility] and the staff member verbally confirmed that the prescription for colchicine is in  process of being filled. I call the patient to inform her this information. Patient verbalized understanding.

## 2023-04-23 LAB — ECHOCARDIOGRAM COMPLETE
Area-P 1/2: 3.48 cm2
S' Lateral: 3.6 cm

## 2023-04-25 ENCOUNTER — Other Ambulatory Visit: Payer: Self-pay

## 2023-04-25 MED ORDER — COLCHICINE 0.6 MG PO TABS: ORAL_TABLET | ORAL | 0 refills | Status: DC

## 2023-05-02 ENCOUNTER — Telehealth: Payer: Self-pay | Admitting: Cardiology

## 2023-05-02 NOTE — Telephone Encounter (Signed)
Pt c/o medication issue:  1. Name of Medication: colchicine 0.6 MG tablet   2. How are you currently taking this medication (dosage and times per day)?    3. Are you having a reaction (difficulty breathing--STAT)? no  4. What is your medication issue? Patient states the medication is making her sick to her stomach. The chest pain is worst and its giving her diarrhea. Patient states that she stop taking it on Thursday. Please advise

## 2023-05-02 NOTE — Telephone Encounter (Signed)
Spoke with Morgan Valdez regarding stopping her colchicine due to it causing her to have diarrhea and makes chest pain worst. Morgan Valdez states that she is now taking Ibuprofen 400mg  twice daily and it is making the chest pain tolerable but doesn't relieve pain competely. Morgan Valdez states that the pain is in the center of her chest. Morgan Valdez states that she is frustrated because this has been going on for almost 3 weeks now. She states that she can't walk from one from to another without feeling short of breath.  Morgan Valdez is wondering if there is an alternative to colchicine that will help with her shortness of breath. Will route to provider to advise.

## 2023-05-03 ENCOUNTER — Encounter: Payer: Self-pay | Admitting: Cardiology

## 2023-05-03 NOTE — Telephone Encounter (Signed)
Patient calling back for update. Please advise  

## 2023-05-03 NOTE — Telephone Encounter (Signed)
Patient is aware we are waiting for provider to provide recommendations

## 2023-05-03 NOTE — Telephone Encounter (Signed)
Colchicine can frequently cause diarrhea but shouldn't be worsening her chest pain. Management of chest pain is outside our scope of practice, will need to wait for physician input.

## 2023-05-03 NOTE — Telephone Encounter (Signed)
Patient is aware of pharmD message. Did advise if she is not getting any relief advised to go to the ED.

## 2023-05-04 NOTE — Telephone Encounter (Signed)
We still need to see the results of Coronary CTA to know for sure if no CAD.  Try taking 1/2 tab Colchicine & increase to 600 mg Motrin BID for now. Can also do trial of NTG SL 0.4 mg to see if that helps.  Bryan Lemma, MD

## 2023-05-06 NOTE — Telephone Encounter (Signed)
Patient is aware of provider message and recommendations. She verbalized understanding.

## 2023-05-09 ENCOUNTER — Encounter (HOSPITAL_COMMUNITY): Payer: Self-pay

## 2023-05-10 ENCOUNTER — Telehealth (HOSPITAL_COMMUNITY): Payer: Self-pay | Admitting: Emergency Medicine

## 2023-05-10 NOTE — Telephone Encounter (Signed)
Reaching out to patient to offer assistance regarding upcoming cardiac imaging study; pt verbalizes understanding of appt date/time, parking situation and where to check in, pre-test NPO status and medications ordered, and verified current allergies; name and call back number provided for further questions should they arise Blane Worthington RN Navigator Cardiac Imaging [facility] [place] [phone] office [phone] cell 

## 2023-05-11 ENCOUNTER — Encounter (HOSPITAL_COMMUNITY): Payer: Self-pay

## 2023-05-11 ENCOUNTER — Ambulatory Visit
Admission: RE | Admit: 2023-05-11 | Discharge: 2023-05-11 | Disposition: A | Discharge status: Home / Self Care | Payer: Medicare HMO | Source: Ambulatory Visit | Attending: Cardiology | Admitting: Cardiology

## 2023-05-11 DIAGNOSIS — I3139 Other pericardial effusion (noninflammatory): Secondary | ICD-10-CM

## 2023-05-11 MED ORDER — NITROGLYCERIN 0.4 MG SL SUBL
0.8000 mg | SUBLINGUAL_TABLET | Freq: Once | SUBLINGUAL | Status: DC
  Filled 2023-05-11: qty 25

## 2023-05-11 MED ORDER — IOHEXOL 350 MG/ML SOLN: 100.0000 mL | Freq: Once | INTRAVENOUS | Status: DC | PRN

## 2023-05-11 MED ORDER — METOPROLOL TARTRATE 5 MG/5ML IV SOLN
5.0000 mg | Freq: Once | INTRAVENOUS | Status: DC
  Filled 2023-05-11: qty 5

## 2023-05-11 NOTE — Progress Notes (Signed)
Pt bp on the lower side, called cardiology and advised pt to take more medication and to reschedule. Pt agreeable.

## 2023-05-12 ENCOUNTER — Emergency Department
Admission: EM | Admit: 2023-05-12 | Discharge: 2023-05-12 | Disposition: A | Discharge status: Home / Self Care | Payer: Medicare HMO | Attending: Emergency Medicine | Admitting: Emergency Medicine

## 2023-05-12 ENCOUNTER — Emergency Department: Payer: Medicare HMO

## 2023-05-12 ENCOUNTER — Telehealth: Payer: Self-pay | Admitting: Cardiology

## 2023-05-12 ENCOUNTER — Telehealth: Payer: Self-pay | Admitting: Emergency Medicine

## 2023-05-12 ENCOUNTER — Encounter: Payer: Self-pay | Admitting: Emergency Medicine

## 2023-05-12 ENCOUNTER — Other Ambulatory Visit: Payer: Self-pay

## 2023-05-12 DIAGNOSIS — Z853 Personal history of malignant neoplasm of breast: Secondary | ICD-10-CM | POA: Diagnosis not present

## 2023-05-12 DIAGNOSIS — J90 Pleural effusion, not elsewhere classified: Secondary | ICD-10-CM | POA: Diagnosis not present

## 2023-05-12 DIAGNOSIS — J181 Lobar pneumonia, unspecified organism: Secondary | ICD-10-CM | POA: Insufficient documentation

## 2023-05-12 DIAGNOSIS — R051 Acute cough: Secondary | ICD-10-CM

## 2023-05-12 DIAGNOSIS — J432 Centrilobular emphysema: Secondary | ICD-10-CM | POA: Diagnosis not present

## 2023-05-12 DIAGNOSIS — R918 Other nonspecific abnormal finding of lung field: Secondary | ICD-10-CM | POA: Diagnosis not present

## 2023-05-12 DIAGNOSIS — R079 Chest pain, unspecified: Secondary | ICD-10-CM

## 2023-05-12 DIAGNOSIS — J449 Chronic obstructive pulmonary disease, unspecified: Secondary | ICD-10-CM | POA: Insufficient documentation

## 2023-05-12 DIAGNOSIS — J189 Pneumonia, unspecified organism: Secondary | ICD-10-CM

## 2023-05-12 DIAGNOSIS — R0789 Other chest pain: Secondary | ICD-10-CM | POA: Diagnosis not present

## 2023-05-12 DIAGNOSIS — I10 Essential (primary) hypertension: Secondary | ICD-10-CM | POA: Diagnosis not present

## 2023-05-12 DIAGNOSIS — I3139 Other pericardial effusion (noninflammatory): Secondary | ICD-10-CM | POA: Diagnosis not present

## 2023-05-12 DIAGNOSIS — J168 Pneumonia due to other specified infectious organisms: Secondary | ICD-10-CM | POA: Diagnosis not present

## 2023-05-12 DIAGNOSIS — R0602 Shortness of breath: Secondary | ICD-10-CM | POA: Diagnosis present

## 2023-05-12 LAB — BASIC METABOLIC PANEL
Anion gap: 10 (ref 5–15)
BUN: 17 mg/dL (ref 8–23)
CO2: 27 mmol/L (ref 22–32)
Calcium: 8.7 mg/dL — ABNORMAL LOW (ref 8.9–10.3)
Chloride: 100 mmol/L (ref 98–111)
Creatinine, Ser: 0.79 mg/dL (ref 0.44–1.00)
GFR, Estimated: >60 mL/min (ref >60)
Glucose, Bld: 120 mg/dL — ABNORMAL HIGH (ref 70–99)
Potassium: 4.2 mmol/L (ref 3.5–5.1)
Sodium: 137 mmol/L (ref 135–145)

## 2023-05-12 LAB — CBC
HCT: 41.6 % (ref 36.0–46.0)
Hemoglobin: 13.2 g/dL (ref 12.0–15.0)
MCH: 29.3 pg (ref 26.0–34.0)
MCHC: 31.7 g/dL (ref 30.0–36.0)
MCV: 92.2 fL (ref 80.0–100.0)
Platelets: 536 10*3/uL — ABNORMAL HIGH (ref 150–400)
RBC: 4.51 MIL/uL (ref 3.87–5.11)
RDW: 13.2 % (ref 11.5–15.5)
WBC: 17.7 10*3/uL — ABNORMAL HIGH (ref 4.0–10.5)
nRBC: 0 % (ref 0.0–0.2)

## 2023-05-12 LAB — BRAIN NATRIURETIC PEPTIDE: B Natriuretic Peptide: 79.4 pg/mL (ref 0.0–100.0)

## 2023-05-12 LAB — TSH: TSH: 0.345 u[IU]/mL — ABNORMAL LOW (ref 0.350–4.500)

## 2023-05-12 LAB — TROPONIN I (HIGH SENSITIVITY)
Troponin I (High Sensitivity): 4 ng/L (ref <18)
Troponin I (High Sensitivity): 4 ng/L (ref <18)

## 2023-05-12 LAB — T4, FREE: Free T4: 1.28 ng/dL — ABNORMAL HIGH (ref 0.61–1.12)

## 2023-05-12 MED ORDER — IOHEXOL 350 MG/ML SOLN
75.0000 mL | Freq: Once | INTRAVENOUS | Status: AC | PRN
  Administered 2023-05-12: 75 mL via INTRAVENOUS

## 2023-05-12 MED ORDER — DOXYCYCLINE HYCLATE 100 MG PO TABS
100.0000 mg | ORAL_TABLET | Freq: Once | ORAL | Status: AC
  Administered 2023-05-12: 100 mg via ORAL
  Filled 2023-05-12: qty 1

## 2023-05-12 MED ORDER — AMOXICILLIN-POT CLAVULANATE 875-125 MG PO TABS: 1.0000 | ORAL_TABLET | Freq: Two times a day (BID) | ORAL | 0 refills | Status: AC

## 2023-05-12 MED ORDER — ONDANSETRON 4 MG PO TBDP
4.0000 mg | ORAL_TABLET | Freq: Once | ORAL | Status: AC
  Administered 2023-05-12: 4 mg via ORAL
  Filled 2023-05-12: qty 1

## 2023-05-12 MED ORDER — DOXYCYCLINE HYCLATE 50 MG PO CAPS: 100.0000 mg | ORAL_CAPSULE | Freq: Two times a day (BID) | ORAL | 0 refills | Status: AC

## 2023-05-12 MED ORDER — FUROSEMIDE 20 MG PO TABS: 20.0000 mg | ORAL_TABLET | Freq: Every day | ORAL | 0 refills | Status: DC

## 2023-05-12 MED ORDER — AMOXICILLIN-POT CLAVULANATE 875-125 MG PO TABS
1.0000 | ORAL_TABLET | Freq: Once | ORAL | Status: AC
  Administered 2023-05-12: 1 via ORAL
  Filled 2023-05-12: qty 1

## 2023-05-12 NOTE — Telephone Encounter (Signed)
Called Morgan Valdez to inform of diuretic prescription to pick up in the morning.

## 2023-05-12 NOTE — Telephone Encounter (Signed)
Pt states her she has BP issue prior to her CT and they would not let her continue through the test. She states she did take the metoprolol but they told her they would prescribe her something else. Please advise.

## 2023-05-12 NOTE — Telephone Encounter (Signed)
Spoke to patient and she stated that she was unable to complete her CT due her "level" being too high. Asked patient what level she was referring to and she just said "they said my level was too high and it couldn't be done." Informed patient that I would contact the ordering provider and she stated that she couldn't wait for a response and was going to present to the nearest emergency department.

## 2023-05-12 NOTE — Discharge Instructions (Addendum)
Take antibiotics as prescribed for the full 5-day course.  Please follow-up with your cardiologist, pulmonologist, and your oncologist to discuss your symptoms.    Your cardiologist should continue to manage your pericardial effusion and pericarditis (continue taking motrin 400mg  every 6 hours) and call Dr Herbie Baltimore for an appointment.   Your pulmonologist should discuss your shortness of breath and pleural effusion (small amount of fluid around your left lung)   Contact your oncologist about your cancer history and your new symptoms of night sweats and weight loss.  Thank you for choosing [place] for your health care today!  Please see your primary doctor this week for a follow up appointment.   If you have any new, worsening, or unexpected symptoms call your doctor right away or come back to the emergency department for reevaluation.  It was my pleasure to care for you today.   Daneil Dan Modesto Charon, MD

## 2023-05-12 NOTE — ED Provider Notes (Addendum)
[facility] Provider Note    Event Date/Time   First MD Initiated Contact with Patient 05/12/23 1123     (approximate)   History   Chest Pain   HPI  Morgan Valdez is a 77 y.o. female   Past medical history of breast cancer, COPD, who presents to the emergency department with ongoing shortness of breath and occasional chest pain for the past 1 month.  She was seen in the emergency department 1 month ago and got a CT angiogram negative for PE, diagnosed with small pericardial effusion with urinary imaging done by cardiology follow-up with instructions to treat with nonsteroidal anti-inflammatories for pericarditis.    She returns because her symptoms are ongoing and unchanged.  Has not worsened but has not improved.  She states that she has tried to get a follow-up appointment with cardiology but has not been able to get in touch.  Notes that she has been having night sweats and weight loss.  She has poor p.o. intake.  She has had approximately 15 pound weight loss in the last month.  Denies cough or fever.  Denies GI or GU complaints.  Independent Historian contributed to assessment above: Her friend Reuel Boom is at bedside to corroborate information given above  External Medical Documents Reviewed: Echo performed by cardiology earlier weeks ago with pericardial effusion instructions to start on colchicine and nonsteroidal anti-inflammatories for pericarditis presumed     Physical Exam   Triage Vital Signs: ED Triage Vitals  Encounter Vitals Group     BP 05/12/23 0919 (!) 145/114     Systolic BP Percentile --      Diastolic BP Percentile --      Pulse Rate 05/12/23 0919 (!) 104     Resp 05/12/23 0919 18     Temp 05/12/23 0919 98.1 F (36.7 C)     Temp Source 05/12/23 0919 Oral     SpO2 05/12/23 0919 98 %     Weight --      Height --      Head Circumference --      Peak Flow --      Pain Score 05/12/23 0917 8     Pain Loc --      Pain Education  --      Exclude from Growth Chart --     Most recent vital signs: Vitals:   05/12/23 0919 05/12/23 1340  BP: (!) 145/114 101/79  Pulse: (!) 104 87  Resp: 18 18  Temp: 98.1 F (36.7 C) 97.9 F (36.6 C)  SpO2: 98% 95%    General: Awake, no distress.  CV:  Good peripheral perfusion.  Resp:  Normal effort. Abd:  No distention.  Other:  Comfortable appearing patient in no acute distress.  Mild tachycardia at 100 normalized on recheck otherwise vital signs within normal limits, hypertension, no hypoxemia no respiratory distress and clear lungs to auscultation to the bilateral lung fields without wheezing or focality.  Soft nontender abdomen.  She appears euvolemic.  He appears nontoxic.   ED Results / Procedures / Treatments   Labs (all labs ordered are listed, but only abnormal results are displayed) Labs Reviewed  BASIC METABOLIC PANEL - Abnormal; Notable for the following components:      Result Value   Glucose, Bld 120 (*)    Calcium 8.7 (*)    All other components within normal limits  CBC - Abnormal; Notable for the following components:   WBC 17.7 (*)  Platelets 536 (*)    All other components within normal limits  TSH - Abnormal; Notable for the following components:   TSH 0.345 (*)    All other components within normal limits  T4, FREE - Abnormal; Notable for the following components:   Free T4 1.28 (*)    All other components within normal limits  BRAIN NATRIURETIC PEPTIDE  TROPONIN I (HIGH SENSITIVITY)  TROPONIN I (HIGH SENSITIVITY)     I ordered and reviewed the above labs they are notable for white blood cell count of 17, not markedly changed from prior testing last month at 15.7.  Electrolytes within normal limits and initial troponin is 4, consistent with prior testing last month.  EKG  ED ECG REPORT I, Pilar Jarvis, the attending physician, personally viewed and interpreted this ECG.   Date: 05/12/2023  EKG Time: 0918  Rate: 100  Rhythm: sinus  tachycardia  Axis: nl  Intervals:none  ST&T Change: no stemi    RADIOLOGY I independently reviewed and interpreted this x-ray and see a small left pleural effusion.   PROCEDURES:  Critical Care performed: No  Procedures   MEDICATIONS ORDERED IN ED: Medications  ondansetron (ZOFRAN-ODT) disintegrating tablet 4 mg (4 mg Oral Given 05/12/23 1157)  iohexol (OMNIPAQUE) 350 MG/ML injection 75 mL (75 mLs Intravenous Contrast Given 05/12/23 1234)  amoxicillin-clavulanate (AUGMENTIN) 875-125 MG per tablet 1 tablet (1 tablet Oral Given 05/12/23 1433)  doxycycline (VIBRA-TABS) tablet 100 mg (100 mg Oral Given 05/12/23 1433)   IMPRESSION / MDM / ASSESSMENT AND PLAN / ED COURSE  I reviewed the triage vital signs and the nursing notes.                                Patient's presentation is most consistent with acute presentation with potential threat to life or bodily function.  Differential diagnosis includes, but is not limited to, ACS, PE, malignancy, respiratory infection, pneumothorax   The patient is on the cardiac monitor to evaluate for evidence of arrhythmia and/or significant heart rate changes.  MDM: This patient with 1 month of ongoing symptoms known to have a small pericardial effusion treated as pericarditis started on nonsteroidal inflammatories but not improving.  She has a history of breast cancer and also suspicious symptoms concerning for worsening malignancy with night sweats, weight loss.  With her history of breast cancer she is high risk for PE and her symptoms do correlate to some degree with negative CT angiogram last month we will rescan today to assess for PE or malignancy, worsening effusions.  Considered ACS but I think less likely as symptomatology does not match, EKG is not ischemic but will follow-up with serial troponins.  She has a pulmonologist, cardiologist, and an oncologist for which she should follow-up with all testing today is  unremarkable.  --- Patient's vital signs within normal limits, stable nontoxic comfortable in the emergency department during the entirety of her stay.  I considered admission or observation with an inpatient stay however given no respiratory distress, normal vital signs, no hypoxemia, no dyspnea at rest and the chronicity of her symptoms unchanged for 1 month I do not think she needs emergent thoracentesis today nor does she appear septic --  but did emphasize close follow up with pulm and to certainly return should symptoms worsen. Prescribed lasix for pleural effusion.   Will treat evidence of infx (wbc and LLL infiltrate) w outpt abx.   She will  follow-up with oncologist for her weight loss and night sweats and cardiology regarding stable pericardial effusion.        FINAL CLINICAL IMPRESSION(S) / ED DIAGNOSES   Final diagnoses:  Nonspecific chest pain  Pleural effusion  Acute cough  Pneumonia due to infectious organism, unspecified laterality, unspecified part of lung     Rx / DC Orders   ED Discharge Orders          Ordered    amoxicillin-clavulanate (AUGMENTIN) 875-125 MG tablet  2 times daily        05/12/23 1413    doxycycline (VIBRAMYCIN) 50 MG capsule  2 times daily        05/12/23 1413             Note:  This document was prepared using Dragon voice recognition software and may include unintentional dictation errors.       Pilar Jarvis, MD 05/12/23 1610    Pilar Jarvis, MD 05/12/23 2245

## 2023-05-12 NOTE — ED Triage Notes (Signed)
Patient to ED via POV for mid-sternal Chest Pain since 04/08/23. Pain radiates to left shoulder.  States she was referred to cardiology but they "haven't done anything." Was suppose to get outpatient CT scan yesterday but was unable to due "to her levels being too high." States decrease appetite and weight loss.

## 2023-05-17 ENCOUNTER — Other Ambulatory Visit: Payer: Medicare HMO

## 2023-05-17 DIAGNOSIS — K29 Acute 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: Secondary | ICD-10-CM | POA: Diagnosis not present

## 2023-05-17 DIAGNOSIS — R918 Other nonspecific abnormal finding of lung field: Secondary | ICD-10-CM | POA: Diagnosis not present

## 2023-05-17 DIAGNOSIS — D72829 Elevated white blood cell count, unspecified: Secondary | ICD-10-CM | POA: Diagnosis not present

## 2023-05-17 DIAGNOSIS — J449 Chronic obstructive pulmonary disease, unspecified: Secondary | ICD-10-CM | POA: Diagnosis not present

## 2023-05-18 ENCOUNTER — Other Ambulatory Visit: Payer: Self-pay | Admitting: *Deleted

## 2023-05-18 ENCOUNTER — Telehealth: Payer: Self-pay

## 2023-05-18 DIAGNOSIS — Z170 Estrogen receptor positive status [ER+]: Secondary | ICD-10-CM

## 2023-05-18 NOTE — Telephone Encounter (Signed)
Transition Care Management Unsuccessful Follow-up Telephone Call  Date of discharge and from where:  05/12/2023 [facility]  Attempts:  2nd Attempt  Reason for unsuccessful TCM follow-up call:  Unable to leave message  Morgan Valdez [facility]  [facility] Population Health Community Resource Care Guide   [email or web address]  ?? [phone]   Website: [email or web address]  [email or web address]

## 2023-05-18 NOTE — Telephone Encounter (Signed)
Transition Care Management Unsuccessful Follow-up Telephone Call  Date of discharge and from where:  05/12/2023 [facility]  Attempts:  1st Attempt  Reason for unsuccessful TCM follow-up call:  Unable to leave message  Jeray Shugart [facility]  [facility] Population Health Community Resource Care Guide   [email or web address]  ?? [phone]   Website: [email or web address]

## 2023-05-19 ENCOUNTER — Other Ambulatory Visit (HOSPITAL_COMMUNITY): Payer: Self-pay | Admitting: *Deleted

## 2023-05-19 MED ORDER — IVABRADINE HCL 5 MG PO TABS: ORAL_TABLET | ORAL | 0 refills | Status: DC

## 2023-05-19 MED ORDER — METOPROLOL TARTRATE 100 MG PO TABS: 100.0000 mg | ORAL_TABLET | Freq: Once | ORAL | 0 refills | Status: DC

## 2023-05-24 ENCOUNTER — Inpatient Hospital Stay: Payer: Medicare HMO | Admitting: Internal Medicine

## 2023-05-24 ENCOUNTER — Inpatient Hospital Stay: Payer: Medicare HMO

## 2023-05-24 ENCOUNTER — Telehealth: Payer: Self-pay | Admitting: Cardiology

## 2023-05-24 NOTE — Telephone Encounter (Signed)
Ok - I guess we will just wait it out [facility]

## 2023-05-24 NOTE — Telephone Encounter (Signed)
Patient called in to review medications that pharmacy had ready for her. She inquired about the colchicine and what was it for. Reviewed this was started based on her echocardiogram results from 04/28/23. She states that she did not take the colchicine due to stomach discomfort. She did take lower dose ibuprofen at 200 mg daily. Advised that I would let provider know that she could not take it due to stomach discomfort.   She then wanted to know what the medications are for her test. Reviewed both medications and answered all of her questions. She verbalized understanding with no further questions at this time.    Marykay Lex, MD 04/28/2023  5:21 PM EDT     Transthoracic echo shows normal left ventricle pump function with abnormal relaxation that is normal for age.  The right ventricle seems to be somewhat thicker than usual and the recommendation is to either consider cardiac CTA or cardiac MRI.  We are actually going to be looking at a cardiac CTA soon.   There is a small to moderate pericardial effusion predominantly on the right ventricle.  No evidence of increased pressures.  There does appear to be elevated pulmonary pressures.   I would like to see the results of the CT scan, may or may not be playing a role in shortness of breath, but could explain some discomfort in the chest.  Would like to start colchicine and NSAID taper   Colchicine 0.6 mg p.o. twice daily for 1 week and 1 tablet daily for 4 weeks (20 days); dispense #42 tablets, no refill Takes over-the-counter ibuprofen 600 mg twice daily x 3 days, 400 mg twice daily x 3 days then 20 mg twice daily for 3 days (ensure that this is taking with food in the stomach, and at least 1 glass of water per dose)   Still would like to see the results of the CT angiogram, but would like to try to treat pericardial effusion as potentially pericarditis.   Bryan Lemma, MD

## 2023-05-24 NOTE — Telephone Encounter (Signed)
Pt c/o medication issue:  1. Name of Medication:   colchicine 0.6 MG tablet    2. How are you currently taking this medication (dosage and times per day)?  Take 1 tablet (0.6) mg by mouth twice daily for 2 weeks then reduce to once daily to complete 2 months. Call office when finished for further refills       3. Are you having a reaction (difficulty breathing--STAT)? No  4. What is your medication issue? Pt is requesting a callback to see why this medication was prescribed to her. It was ready for pick up when she went to pick up another medication but she left it there because she was unsure about it until discussing further with nurse. Please advise.

## 2023-06-07 ENCOUNTER — Telehealth (HOSPITAL_COMMUNITY): Payer: Self-pay | Admitting: *Deleted

## 2023-06-07 NOTE — Telephone Encounter (Signed)
Attempted to call patient regarding upcoming cardiac CT appointment. # not accepting calls at this time, unable to leave VM.   Johney Frame RN Navigator Cardiac Imaging [facility] [phone] Office

## 2023-06-08 ENCOUNTER — Ambulatory Visit
Admission: RE | Admit: 2023-06-08 | Discharge: 2023-06-08 | Disposition: A | Discharge status: Home / Self Care | Payer: Medicare HMO | Source: Ambulatory Visit | Attending: Cardiology | Admitting: Cardiology

## 2023-06-08 DIAGNOSIS — R0609 Other forms of dyspnea: Secondary | ICD-10-CM | POA: Diagnosis not present

## 2023-06-08 DIAGNOSIS — R079 Chest pain, unspecified: Secondary | ICD-10-CM | POA: Insufficient documentation

## 2023-06-08 DIAGNOSIS — I3139 Other pericardial effusion (noninflammatory): Secondary | ICD-10-CM | POA: Insufficient documentation

## 2023-06-08 DIAGNOSIS — I700 Atherosclerosis of aorta: Secondary | ICD-10-CM | POA: Insufficient documentation

## 2023-06-08 MED ORDER — NITROGLYCERIN 0.4 MG SL SUBL
0.4000 mg | SUBLINGUAL_TABLET | SUBLINGUAL | Status: DC | PRN
  Administered 2023-06-08: 0.4 mg via SUBLINGUAL
  Filled 2023-06-08 (×2): qty 25

## 2023-06-08 MED ORDER — IOHEXOL 350 MG/ML SOLN
80.0000 mL | Freq: Once | INTRAVENOUS | Status: AC | PRN
  Administered 2023-06-08: 80 mL via INTRAVENOUS

## 2023-06-08 NOTE — Progress Notes (Signed)

## 2023-07-01 DIAGNOSIS — S86911A Strain of unspecified muscle(s) and tendon(s) at lower leg level, right leg, initial encounter: Secondary | ICD-10-CM | POA: Diagnosis not present

## 2023-07-21 ENCOUNTER — Encounter: Payer: Self-pay | Admitting: Cardiology

## 2023-07-21 ENCOUNTER — Ambulatory Visit: Payer: Medicare HMO | Attending: Cardiology | Admitting: Cardiology

## 2023-07-21 VITALS — BP 128/86 | HR 98 | Ht 67.0 in | Wt 132.8 lb

## 2023-07-21 DIAGNOSIS — E782 Mixed hyperlipidemia: Secondary | ICD-10-CM | POA: Diagnosis not present

## 2023-07-21 DIAGNOSIS — I3139 Other pericardial effusion (noninflammatory): Secondary | ICD-10-CM | POA: Diagnosis not present

## 2023-07-21 DIAGNOSIS — J432 Centrilobular emphysema: Secondary | ICD-10-CM | POA: Diagnosis not present

## 2023-07-21 DIAGNOSIS — R072 Precordial pain: Secondary | ICD-10-CM | POA: Diagnosis not present

## 2023-07-21 DIAGNOSIS — J439 Emphysema, unspecified: Secondary | ICD-10-CM | POA: Insufficient documentation

## 2023-07-21 NOTE — Patient Instructions (Signed)
Medication Instructions:  - Your physician recommends that you continue on your current medications as directed. Please refer to the Current Medication list given to you today.  *If you need a refill on your cardiac medications before your next appointment, please call your pharmacy*   Lab Work: - none ordered  If you have labs (blood work) drawn today and your tests are completely normal, you will receive your results only by: MyChart Message (if you have MyChart) OR A paper copy in the mail If you have any lab test that is abnormal or we need to change your treatment, we will call you to review the results.   Testing/Procedures: 1) Limited Echocardiogram:- in 2 months  - Your physician has requested that you have a limited echocardiogram. Echocardiography is a painless test that uses sound waves to create images of your heart. It provides your doctor with information about the size and shape of your heart and how well your heart's chambers and valves are working. This procedure takes approximately one hour. There are no restrictions for this procedure. There is a possibility that an IV may need to be started during your test to inject an image enhancing agent. This is done to obtain more optimal pictures of your heart. Therefore we ask that you do at least drink some water prior to coming in to hydrate your veins.   Please do NOT wear cologne, perfume, aftershave, or lotions (deodorant is allowed). Please arrive 15 minutes prior to your appointment time.    Follow-Up: At [facility], you and your health needs are our priority.  As part of our continuing mission to provide you with exceptional heart care, we have created designated Provider Care Teams.  These Care Teams include your primary Cardiologist (physician) and Advanced Practice Providers (APPs -  Physician Assistants and Nurse Practitioners) who all work together to provide you with the care you need, when you need  it.  We recommend signing up for the patient portal called "MyChart".  Sign up information is provided on this After Visit Summary.  MyChart is used to connect with patients for Virtual Visits (Telemedicine).  Patients are able to view lab/test results, encounter notes, upcoming appointments, etc.  Non-urgent messages can be sent to your provider as well.   To learn more about what you can do with MyChart, go to [email or web address].    Your next appointment:   6 month(s)  Provider:   You may see Bryan Lemma, MD or one of the following Advanced Practice Providers on your designated Care Team:   Nicolasa Ducking, NP Eula Listen, PA-C Cadence Fransico Michael, PA-C Charlsie Quest, NP    Other Instructions  Echocardiogram An echocardiogram is a test that uses sound waves to make images of your heart. This way of making images is often called ultrasound. The images from this test can help find out many things about your heart, including: The size and shape of your heart. The strength of your heart muscle and how well it's working. The size, thickness, and movement of your heart's walls. How your heart valves are working. Problems such as: A tumor or a growth from an infection around the heart valves. Areas of heart muscle that aren't working well because of poor blood flow or injury from a heart attack. An aneurysm. This is a weak or damaged part of an artery wall. An artery is a blood vessel. Tell a health care provider about: Any allergies you have. All medicines you're taking, including  vitamins, herbs, eye drops, creams, and over-the-counter medicines. Any bleeding problems you have. Any surgeries you've had. Any medical problems you have. Whether you're pregnant or may be pregnant. What are the risks? Your health care provider will talk with you about risks. These may include an allergic reaction to IV dye that may be used during the test. What happens before the test? You don't need  to do anything to get ready for this test. You may eat and drink normally. What happens during the test?  You'll take off your clothes from the waist up and put on a hospital gown. Sticky patches called electrodes may be placed on your chest. These will be connected to a machine that monitors your heart rate and rhythm. You'll lie down on a table for the exam. A wand covered in gel will be moved over your chest. Sound waves from the wand will go to your heart and bounce back--or "echo" back. The sound waves will go to a computer that uses them to make images of your heart. The images can be viewed on a monitor. The images will also be recorded on the computer so your provider can look at them later. You may be asked to change positions or hold your breath for a short time. This makes it easier to get different views or better views of your heart. In some cases, you may be given a dye through an IV. The IV is put into one of your veins. This dye can make the areas of your heart easier to see. The procedure may vary among providers and hospitals. What can I expect after the test? You may return to your normal diet, activities, and medicines unless your provider tells you not to. If an IV was placed for the test, it will be removed. It's up to you to get the results of your test. Ask your provider, or the department that's doing the test, when your results will be ready. This information is not intended to replace advice given to you by your health care provider. Make sure you discuss any questions you have with your health care provider. Document Revised: 12/17/2022 Document Reviewed: 12/17/2022 Elsevier Patient Education  2024 [employer].

## 2023-07-21 NOTE — Progress Notes (Signed)
Cardiology Office Note:  .   Date:  07/21/2023  ID:  Morgan Valdez, DOB 05-07-1946, MRN [id number] PCP: Barbette Reichmann, MD  [facility] Providers Cardiologist:  None     Chief Complaint  Patient presents with   Follow-up    3 month follow up. Patient feels well today with the exception of some back pain. Medications reviewed verbally.    History of Present Illness: .     Morgan Valdez is a  77 y.o. female "former smoker - quit 3 months ago" with a PMH notable for COPD who presents here for 65-month follow-up to discuss test results at the request of Barbette Reichmann, MD.  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  CT scan suggested possible pericardial effusion.  We did order 2D echocardiogram but also Coronary CTA.  Based on echo results, via telephone, I recommend that she start colchicine and ibuprofen taper: Colchicine 0.6 mg p.o. twice daily for 1 week and 1 tablet daily for 4 weeks (20 days); dispense #42 tablets, no refill Takes over-the-counter ibuprofen 600 mg twice daily x 3 days, 400 mg twice daily x 3 days then 20 mg twice daily for 3 days (ensure that this is taking with food in the stomach, and at least 1 glass of water per dose)  Was seen in the [facility] ER on May 12, 2023 for chest pain.  Stated that her symptoms were ongoing and unchanged.    Subjective  INTERVAL HISTORY Morgan Valdez returns for f/u -- no longer having the CP. Not able to tolerate ibuprofen & colchicine - nausea &  diarrhea.  But did help Sx. interestingly, she took the medicines for about a week or so but then it really upset her stomach.  Since he stopped the medicines however taking them seem to help with chest pain and eventually went  away.  Still has SOB & DOE - slowly progressing.  Occasionally wakes up SOB - but not PND or orthopnea.   Cardiovascular ROS: positive for - dyspnea on exertion and shortness of breath negative for - chest pain, edema, irregular heartbeat, orthopnea, palpitations, paroxysmal nocturnal dyspnea, rapid heart rate, or syncope/nar syncope/ TIA/ amaurosis fugax, claudication.   ROS:   Review of Systems - Negative except mild cough --improved with smoking cessation;  back pain/ head aches. Sinus drainage / allergies .  R Ankle sprain - swelling      Objective   Studies Reviewed: Marland Kitchen        TTE 04/22/2023: Normal LVEF 55 to 60%.  No RWMA.  GR 1 DD.  Normal RV size and function.  Severely increased RV thickness in the basal region with septation and mid to apical RV free wall.  Small to moderate pericardial effusion around the RV free wall.  No tamponade.  Mild MR.  AOV sclerosis no stenosis.  Mild RA dilation and elevated RAP. I personally reviewed the echo films and CT scans with both Drs. Agbor-Etang and Dr. Lennie Odor.  Their impression of the RV abnormality was that this was a pericardial fat pad and not of concern.  They also were less convinced that there was a significant amount of pericardial effusion. Coronary CTA 06/08/2023: Coronary Calcium Score 0.  Normal coronary arteries on the right dominance.  No evidence of CAD.  CAD-RADS 0.  Small left pleural effusion with partial compressive atelectasis of the left lower lobe.  Aortic atherosclerosis and emphysema.  Normal pulmonary artery size.  Risk Assessment/Calculations:          Physical Exam:   VS:  BP 128/86 (BP Location: Right Arm, Patient Position: Sitting, Cuff Size: Normal)   Pulse 98   Ht 5\' 7"  (1.702 m)   Wt 132 lb 12.8 oz (60.2 kg)   SpO2 96%   BMI 20.80 kg/m    Wt Readings from Last 3 Encounters:  07/21/23 132 lb 12.8 oz (60.2 kg)  04/14/23 144 lb 2 oz (65.4 kg)  12/09/22 156 lb 8.4 oz (71 kg)    GEN: Well nourished,  well developed in no acute distress; well-groomed.  Somewhat confused.  Poor historian. NECK: No JVD; No carotid bruits CARDIAC: Normal S1, S2; RRR, no murmurs, rubs, gallops RESPIRATORY:  Clear to auscultation without rales, wheezing or rhonchi ; nonlabored, good air movement. ABDOMEN: Soft, non-tender, non-distended EXTREMITIES:  No edema; No deformity     ASSESSMENT AND PLAN: .    Problem List Items Addressed This Visit       Cardiology Problems   Hyperlipidemia (Chronic)    Would probably benefit from lipid management to reduce LDL least down to below 100 for general principal, but however the fact that her coronary Score was 0 and there is no significant disease with indicate this is not as urgent.  Will defer to PCP.      Pericardial effusion - Primary (Chronic)    She still has pericardial fusion noted on her echo as well as a CT scan.  We scheduled a follow-up CT scan as well.  Plan will be to reassess with limited echo in about a month or so to make sure is not progressing in size.  If looks stable if improved, would simply just monitor for symptom change.  She did not tolerate colchicine and ibuprofen but did say that when she took it for little bit her.  Symptoms are relieved.  Hopefully there will be some reduction in size.      Relevant Orders   ECHOCARDIOGRAM LIMITED     Other   Emphysema lung (HCC) (Chronic)    I suspect this is the main reason for her dyspnea.  We discussed smoking cessation      Relevant Medications   albuterol (VENTOLIN HFA) 108 (90 Base) MCG/ACT inhaler   Precordial pain (Chronic)    Thankfully, pain seems improved.  Coronary CTA was essentially normal with Coronary Calcium Score essentially Zero and no evidence of CAD.               Dispo: Return in about 6 months (around 01/18/2024) for Follow-up in [place], 6 month follow-up with me.  Total time spent: 25 min spent with patient + 18 min spent charting = 43  min      Signed, Marykay Lex, MD, MS Bryan Lemma, M.D., M.S. Interventional Cardiologist  [facility]  Pager # [phone] Phone # [phone] [address] [place], [place] [address]

## 2023-07-21 NOTE — Assessment & Plan Note (Signed)
Would probably benefit from lipid management to reduce LDL least down to below 100 for general principal, but however the fact that her coronary Score was 0 and there is no significant disease with indicate this is not as urgent.  Will defer to PCP.

## 2023-07-21 NOTE — Assessment & Plan Note (Signed)
Thankfully, pain seems improved.  Coronary CTA was essentially normal with Coronary Calcium Score essentially Zero and no evidence of CAD.

## 2023-07-21 NOTE — Assessment & Plan Note (Addendum)
She still has pericardial fusion noted on her echo as well as a CT scan.  We scheduled a follow-up CT scan as well.  Plan will be to reassess with limited echo in about a month or so to make sure is not progressing in size.  If looks stable if improved, would simply just monitor for symptom change.  She did not tolerate colchicine and ibuprofen but did say that when she took it for little bit her.  Symptoms are relieved.  Hopefully there will be some reduction in size.

## 2023-07-21 NOTE — Assessment & Plan Note (Signed)
I suspect this is the main reason for her dyspnea.  We discussed smoking cessation

## 2023-08-02 ENCOUNTER — Ambulatory Visit
Admission: RE | Admit: 2023-08-02 | Discharge: 2023-08-02 | Disposition: A | Discharge status: Home / Self Care | Payer: Medicare HMO | Source: Ambulatory Visit | Attending: Internal Medicine | Admitting: Internal Medicine

## 2023-08-02 DIAGNOSIS — C50812 Malignant neoplasm of overlapping sites of left female breast: Secondary | ICD-10-CM | POA: Diagnosis not present

## 2023-08-02 DIAGNOSIS — J4489 Other specified chronic obstructive pulmonary disease: Secondary | ICD-10-CM | POA: Diagnosis not present

## 2023-08-02 DIAGNOSIS — F17200 Nicotine dependence, unspecified, uncomplicated: Secondary | ICD-10-CM | POA: Insufficient documentation

## 2023-08-02 DIAGNOSIS — Z923 Personal history of irradiation: Secondary | ICD-10-CM | POA: Diagnosis not present

## 2023-08-02 DIAGNOSIS — Z13820 Encounter for screening for osteoporosis: Secondary | ICD-10-CM | POA: Diagnosis not present

## 2023-08-02 DIAGNOSIS — Z780 Asymptomatic menopausal state: Secondary | ICD-10-CM | POA: Diagnosis not present

## 2023-08-02 DIAGNOSIS — Z170 Estrogen receptor positive status [ER+]: Secondary | ICD-10-CM | POA: Insufficient documentation

## 2023-08-02 DIAGNOSIS — M810 Age-related osteoporosis without current pathological fracture: Secondary | ICD-10-CM | POA: Insufficient documentation

## 2023-08-09 ENCOUNTER — Encounter: Payer: Self-pay | Admitting: Nurse Practitioner

## 2023-08-09 ENCOUNTER — Inpatient Hospital Stay: Payer: Medicare HMO | Attending: Radiation Oncology

## 2023-08-09 ENCOUNTER — Inpatient Hospital Stay: Payer: Medicare HMO | Admitting: Nurse Practitioner

## 2023-08-09 VITALS — BP 146/88 | HR 93 | Temp 97.8°F | Wt 137.0 lb

## 2023-08-09 DIAGNOSIS — Z800 Family history of malignant neoplasm of digestive organs: Secondary | ICD-10-CM | POA: Diagnosis not present

## 2023-08-09 DIAGNOSIS — Z923 Personal history of irradiation: Secondary | ICD-10-CM | POA: Diagnosis not present

## 2023-08-09 DIAGNOSIS — F17210 Nicotine dependence, cigarettes, uncomplicated: Secondary | ICD-10-CM | POA: Insufficient documentation

## 2023-08-09 DIAGNOSIS — C50812 Malignant neoplasm of overlapping sites of left female breast: Secondary | ICD-10-CM | POA: Diagnosis not present

## 2023-08-09 DIAGNOSIS — Z801 Family history of malignant neoplasm of trachea, bronchus and lung: Secondary | ICD-10-CM | POA: Insufficient documentation

## 2023-08-09 DIAGNOSIS — M810 Age-related osteoporosis without current pathological fracture: Secondary | ICD-10-CM | POA: Insufficient documentation

## 2023-08-09 DIAGNOSIS — Z808 Family history of malignant neoplasm of other organs or systems: Secondary | ICD-10-CM | POA: Insufficient documentation

## 2023-08-09 DIAGNOSIS — Z79899 Other long term (current) drug therapy: Secondary | ICD-10-CM | POA: Insufficient documentation

## 2023-08-09 DIAGNOSIS — Z08 Encounter for follow-up examination after completed treatment for malignant neoplasm: Secondary | ICD-10-CM

## 2023-08-09 DIAGNOSIS — Z170 Estrogen receptor positive status [ER+]: Secondary | ICD-10-CM

## 2023-08-09 DIAGNOSIS — Z853 Personal history of malignant neoplasm of breast: Secondary | ICD-10-CM | POA: Diagnosis not present

## 2023-08-09 DIAGNOSIS — Z5181 Encounter for therapeutic drug level monitoring: Secondary | ICD-10-CM

## 2023-08-09 DIAGNOSIS — D751 Secondary polycythemia: Secondary | ICD-10-CM | POA: Insufficient documentation

## 2023-08-09 DIAGNOSIS — Z79811 Long term (current) use of aromatase inhibitors: Secondary | ICD-10-CM

## 2023-08-09 LAB — CBC WITH DIFFERENTIAL/PLATELET
Abs Immature Granulocytes: 0.12 10*3/uL — ABNORMAL HIGH (ref 0.00–0.07)
Basophils Absolute: 0.1 10*3/uL (ref 0.0–0.1)
Basophils Relative: 1 %
Eosinophils Absolute: 0.1 10*3/uL (ref 0.0–0.5)
Eosinophils Relative: 1 %
HCT: 48.7 % — ABNORMAL HIGH (ref 36.0–46.0)
Hemoglobin: 15.8 g/dL — ABNORMAL HIGH (ref 12.0–15.0)
Immature Granulocytes: 1 %
Lymphocytes Relative: 30 %
Lymphs Abs: 2.7 10*3/uL (ref 0.7–4.0)
MCH: 29.9 pg (ref 26.0–34.0)
MCHC: 32.4 g/dL (ref 30.0–36.0)
MCV: 92.2 fL (ref 80.0–100.0)
Monocytes Absolute: 0.7 10*3/uL (ref 0.1–1.0)
Monocytes Relative: 7 %
Neutro Abs: 5.5 10*3/uL (ref 1.7–7.7)
Neutrophils Relative %: 60 %
Platelets: 310 10*3/uL (ref 150–400)
RBC: 5.28 MIL/uL — ABNORMAL HIGH (ref 3.87–5.11)
RDW: 14.6 % (ref 11.5–15.5)
WBC: 9.2 10*3/uL (ref 4.0–10.5)
nRBC: 0 % (ref 0.0–0.2)

## 2023-08-09 LAB — COMPREHENSIVE METABOLIC PANEL
ALT: 12 U/L (ref 0–44)
AST: 16 U/L (ref 15–41)
Albumin: 4 g/dL (ref 3.5–5.0)
Alkaline Phosphatase: 63 U/L (ref 38–126)
Anion gap: 9 (ref 5–15)
BUN: 10 mg/dL (ref 8–23)
CO2: 27 mmol/L (ref 22–32)
Calcium: 9.3 mg/dL (ref 8.9–10.3)
Chloride: 102 mmol/L (ref 98–111)
Creatinine, Ser: 0.77 mg/dL (ref 0.44–1.00)
GFR, Estimated: >60 mL/min (ref >60)
Glucose, Bld: 105 mg/dL — ABNORMAL HIGH (ref 70–99)
Potassium: 4.9 mmol/L (ref 3.5–5.1)
Sodium: 138 mmol/L (ref 135–145)
Total Bilirubin: 0.5 mg/dL (ref 0.3–1.2)
Total Protein: 7.5 g/dL (ref 6.5–8.1)

## 2023-08-09 NOTE — Progress Notes (Signed)
[facility] CONSULT NOTE  Patient Care Team: Barbette Reichmann, MD as PCP - General (Internal Medicine) Scarlett Presto, RN (Inactive) as Oncology Nurse Navigator Earna Coder, MD as Consulting Physician (Internal Medicine) Carmina Miller, MD as Referring Physician (Radiation Oncology) Carolan Shiver, MD as Consulting Physician (General Surgery)  CHIEF COMPLAINTS/PURPOSE OF CONSULTATION: Breast cancer  #  Oncology History Overview Note  #  BREAST, LEFT 2:00; ULTRASOUND-GUIDED BIOPSY:  - INVASIVE MAMMARY CARCINOMA WITH FEATURES SUGGESTIVE OF SOLID  PSEUDOPAPILLARY CARCINOMA; ER >90%; PR > 90%; he 2 Neu-NEG [Dr.Cinton]  PATHOLOGIC STAGE CLASSIFICATION (pTNM, AJCC 8th Edition):  TNM Descriptors: Not applicable  pT1a  Regional Lymph Nodes Modifier: sn  pN0  pM - Not applicable   SPECIAL STUDIES  Breast Biomarker Testing Performed on Previous Biopsy: [id number]  Estrogen Receptor (ER) Status: POSITIVE          Percentage of cells with nuclear positivity: Greater than 90%          Average intensity of staining: Strong   Progesterone Receptor (PgR) Status: POSITIVE          Percentage of cells with nuclear positivity: Greater than 90%          Average intensity of staining: Moderate   HER2 (by immunohistochemistry): NEGATIVE (Score 1+)  #Cancer screening : colonoscopy [since 56 years; again aug 2022];smoker;        Carcinoma of overlapping sites of left breast in female, estrogen receptor positive (HCC)  01/19/2021 Initial Diagnosis   Carcinoma of overlapping sites of left breast in female, estrogen receptor positive (HCC)   01/19/2021 Cancer Staging   Staging form: Breast, AJCC 8th Edition - Clinical: Stage IA (cT1a, cN0, cM0, G2, ER+, PR+, HER2-) - Signed by Earna Coder, MD on 01/19/2021 Histologic grading system: 3 grade system    Genetic Testing   Negative genetic testing. No pathogenic variants identified on the [facility]  Multi-Cancer Panel+RNA. The report date is 03/18/2021.  The Multi-Cancer Panel + RNA offered by [facility] includes sequencing and/or deletion duplication testing of the following 84 genes: AIP, ALK, APC, ATM, AXIN2,BAP1,  BARD1, BLM, BMPR1A, BRCA1, BRCA2, BRIP1, CASR, CDC73, CDH1, CDK4, CDKN1B, CDKN1C, CDKN2A (p14ARF), CDKN2A (p16INK4a), CEBPA, CHEK2, CTNNA1, DICER1, DIS3L2, EGFR (c.2369C>T, p.Thr790Met variant only), EPCAM (Deletion/duplication testing only), FH, FLCN, GATA2, GPC3, GREM1 (Promoter region deletion/duplication testing only), HOXB13 (c.251G>A, p.Gly84Glu), HRAS, KIT, MAX, MEN1, MET, MITF (c.952G>A, p.Glu318Lys variant only), MLH1, MSH2, MSH3, MSH6, MUTYH, NBN, NF1, NF2, NTHL1, PALB2, PDGFRA, PHOX2B, PMS2, POLD1, POLE, POT1, PRKAR1A, PTCH1, PTEN, RAD50, RAD51C, RAD51D, RB1, RECQL4, RET, RUNX1, SDHAF2, SDHA (sequence changes only), SDHB, SDHC, SDHD, SMAD4, SMARCA4, SMARCB1, SMARCE1, STK11, SUFU, TERC, TERT, TMEM127, TP53, TSC1, TSC2, VHL, WRN and WT1.    HISTORY OF PRESENTING ILLNESS: Ambulating independently.  Alone.  Morgan Valdez 77 y.o.  female stage I breast cancer ER/PR positive HER2 negative-anastrozole is here for follow-up. She has ongoing left sided breast pain with specific movements that is stable and unchanged. She denies new lumps, skin changes, or discharge. She reports compliance with her anastrozole. She has chronic aches and pains but no worse. She was seen in ER this summer for chest pain which was thought to be secondary to pericarditis, no improvement with NSAIDs, negative PE studies, thought to be secondary to pneumonia and treated with antibiotics. Symptoms have now resolved. S   Review of Systems  Constitutional:  Negative for chills, diaphoresis, fever, malaise/fatigue and weight loss.  HENT:  Negative for nosebleeds and sore  throat.   Eyes:  Negative for double vision.  Respiratory:  Negative for cough, hemoptysis, sputum production, shortness of breath and  wheezing.   Cardiovascular:  Negative for chest pain, palpitations, orthopnea and leg swelling.  Gastrointestinal:  Negative for abdominal pain, blood in stool, constipation, diarrhea, heartburn, melena, nausea and vomiting.  Genitourinary:  Negative for dysuria, frequency and urgency.  Musculoskeletal:  Negative for back pain and joint pain.  Skin:  Negative for itching and rash.  Neurological:  Negative for dizziness, tingling, focal weakness, weakness and headaches.  Endo/Heme/Allergies:  Does not bruise/bleed easily.  Psychiatric/Behavioral:  Negative for depression. The patient is not nervous/anxious and does not have insomnia.      MEDICAL HISTORY:  Past Medical History:  Diagnosis Date   Allergic state    Anxiety    Arthritis    Barrett's esophagus    Breast cancer (HCC)    Left   Chronic cough    COPD (chronic obstructive pulmonary disease) (HCC)    Depression    Duodenitis    Emphysema of lung (HCC)    Esophageal motility disorder    Family history of adrenal cancer    Family history of brain cancer    Family history of colon cancer    Family history of lung cancer    Family history of melanoma    Gastritis    GERD (gastroesophageal reflux disease)    Headache    migraines - 3-4x/yr   Osteoporosis, post-menopausal    Personal history of radiation therapy 2022   breast cancer   Pre-diabetes    Tobacco abuse disorder    Varicose veins of legs     SURGICAL HISTORY: Past Surgical History:  Procedure Laterality Date   APPENDECTOMY     BREAST BIOPSY Left 01/02/2021   [facility] WITH FEATURES SUGGESTIVE OF SOLID PSEUDOPAPILLARY CARCINOMA   BREAST LUMPECTOMY Left 12/2020   SOLID PAPILLARY CARCINOMA, 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   BREAST LUMPECTOMY,RADIO FREQ LOCALIZER,AXILLARY SENTINEL LYMPH NODE BIOPSY Left 01/28/2021   Procedure: BREAST LUMPECTOMY,RADIO FREQ LOCALIZER,AXILLARY SENTINEL LYMPH NODE BIOPSY;  Surgeon: Carolan Shiver, MD;  Location: [facility] ORS;  Service:  General;  Laterality: Left;   CATARACT EXTRACTION W/PHACO Right 01/03/2017   Procedure: CATARACT EXTRACTION PHACO AND INTRAOCULAR LENS PLACEMENT (IOC)  Right;  Surgeon: Sherald Hess, MD;  Location: [facility];  Service: Ophthalmology;  Laterality: Right;   CATARACT EXTRACTION W/PHACO Left 01/17/2017   Procedure: CATARACT EXTRACTION PHACO AND INTRAOCULAR LENS PLACEMENT (IOC);  Surgeon: Sherald Hess, MD;  Location: [facility];  Service: Ophthalmology;  Laterality: Left;  left   CHOLECYSTECTOMY     COLONOSCOPY WITH PROPOFOL N/A 04/06/2018   Procedure: COLONOSCOPY WITH PROPOFOL;  Surgeon: Christena Deem, MD;  Location: [facility] ENDOSCOPY;  Service: Endoscopy;  Laterality: N/A;   COLONOSCOPY WITH PROPOFOL N/A 06/08/2021   Procedure: COLONOSCOPY WITH PROPOFOL;  Surgeon: Toledo, Boykin Nearing, MD;  Location: [facility] ENDOSCOPY;  Service: Gastroenterology;  Laterality: N/A;   ESOPHAGOGASTRODUODENOSCOPY N/A 09/26/2018   Procedure: ESOPHAGOGASTRODUODENOSCOPY (EGD);  Surgeon: Christena Deem, MD;  Location: [facility] ENDOSCOPY;  Service: Endoscopy;  Laterality: N/A;   ESOPHAGOGASTRODUODENOSCOPY (EGD) WITH PROPOFOL N/A 10/14/2015   Procedure: ESOPHAGOGASTRODUODENOSCOPY (EGD) WITH PROPOFOL;  Surgeon: Christena Deem, MD;  Location: [facility] ENDOSCOPY;  Service: Endoscopy;  Laterality: N/A;   EYE SURGERY Bilateral 2019   cataracts    FRACTURE SURGERY     HERNIA REPAIR      SOCIAL HISTORY: Social History   Socioeconomic History  Marital status: Widowed    Spouse name: Not on file   Number of children: Not on file   Years of education: Not on file   Highest education level: Not on file  Occupational History   Not on file  Tobacco Use   Smoking status: Some Days    Current packs/day: 0.50    Average packs/day: 0.5 packs/day for 50.0 years (25.0 ttl pk-yrs)    Types: Cigarettes   Smokeless tobacco: Never  Vaping Use   Vaping status: Never Used  Substance and Sexual  Activity   Alcohol use: Yes    Alcohol/week: 1.0 standard drink of alcohol    Types: 1 Glasses of wine per week    Comment: rare - Holidays   Drug use: No   Sexual activity: Not on file  Other Topics Concern   Not on file  Social History Narrative   Lives in [place]; 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; smoke- 1/2 ppd [since 17 years]; no alcohol   Social Determinants of Health   Financial Resource Strain: Low Risk  (04/06/2023)   Received from [facility], [facility]   Overall Financial Resource Strain (CARDIA)    Difficulty of Paying Living Expenses: Not hard at all  Food Insecurity: No Food Insecurity (04/06/2023)   Received from [facility], [facility]   Hunger Vital Sign    Worried About Running Out of Food in the Last Year: Never true    Ran Out of Food in the Last Year: Never true  Transportation Needs: No Transportation Needs (04/06/2023)   Received from [facility], [facility]   [facility] - Transportation    In the past 12 months, has lack of transportation kept you from medical appointments or from getting medications?: No    Lack of Transportation (Non-Medical): No  Physical Activity: Not on file  Stress: Not on file  Social Connections: Not on file  Intimate Partner Violence: Not on file    FAMILY HISTORY: Family History  Problem Relation Age of Onset   Cancer Mother        esophagus, spine. adrenal gland; lung.    Dementia Father    Cancer Sister        colon cancer- 40s to liver.    Cancer Maternal Grandmother        brain cancer   Diabetes Maternal Grandfather    Stroke Maternal Grandfather    Rectal cancer Daughter        at 24 years; survived.    Melanoma Grandson        dx 22 on neck   Breast cancer Neg Hx     ALLERGIES:  is allergic to tape, nicotine, and bacitracin.  MEDICATIONS:  Current Outpatient Medications  Medication Sig Dispense Refill    albuterol (VENTOLIN HFA) 108 (90 Base) MCG/ACT inhaler Inhale into the lungs.     ALPRAZolam (XANAX) 0.25 MG tablet Take 0.25 mg by mouth 2 (two) times daily as needed for anxiety. prn     anastrozole (ARIMIDEX) 1 MG tablet TAKE 1 TABLET EVERY DAY 90 tablet 3   buPROPion (WELLBUTRIN XL) 300 MG 24 hr tablet Take by mouth.     cetirizine (ZYRTEC) 10 MG tablet Take 10 mg by mouth daily as needed for allergies.     esomeprazole (NEXIUM) 40 MG capsule Take 40 mg by mouth 2 (two) times daily.     gabapentin (NEURONTIN) 100 MG capsule Take  100 mg by mouth 3 (three) times daily. Two capsules 3 times a day as needed     ondansetron (ZOFRAN) 4 MG tablet Take 4 mg by mouth 2 (two) times daily as needed for nausea/vomiting.  0   SPIRIVA HANDIHALER 18 MCG inhalation capsule Place 1 capsule into inhaler and inhale in the morning.     topiramate (TOPAMAX) 25 MG tablet Take 25 mg by mouth daily as needed.     No current facility-administered medications for this visit.      Marland Kitchen  PHYSICAL EXAMINATION: ECOG PERFORMANCE STATUS: 0 - Asymptomatic  Vitals:   08/09/23 0934  BP: (!) 146/88  Pulse: 93  Temp: 97.8 F (36.6 C)  SpO2: 100%   Filed Weights   08/09/23 0934  Weight: 137 lb (62.1 kg)    Physical Exam Constitutional:      Appearance: She is not ill-appearing.  HENT:     Head: Normocephalic and atraumatic.     Mouth/Throat:     Pharynx: No oropharyngeal exudate.  Cardiovascular:     Rate and Rhythm: Normal rate and regular rhythm.  Pulmonary:     Effort: Pulmonary effort is normal. No respiratory distress.     Breath sounds: Normal breath sounds. No wheezing.  Chest:     Comments: Declined breast exam Abdominal:     General: There is no distension.     Palpations: Abdomen is soft.     Tenderness: There is no abdominal tenderness. There is no guarding.  Musculoskeletal:        General: No tenderness.     Cervical back: Normal range of motion and neck supple.  Skin:    General: Skin  is warm.     Coloration: Skin is not pale.  Neurological:     Mental Status: She is alert and oriented to person, place, and time.  Psychiatric:        Mood and Affect: Affect normal.     LABORATORY DATA:  I have reviewed the data as listed Lab Results  Component Value Date   WBC 9.2 08/09/2023   HGB 15.8 (H) 08/09/2023   HCT 48.7 (H) 08/09/2023   MCV 92.2 08/09/2023   PLT 310 08/09/2023   Recent Labs    11/16/22 0921 12/09/22 1601 04/10/23 0336 04/10/23 0418 05/12/23 0918 08/09/23 0916  NA 138   < >  --  137 137 138  K 4.7   < >  --  4.1 4.2 4.9  CL 101   < >  --  101 100 102  CO2 29   < >  --  25 27 27   GLUCOSE 102*   < >  --  138* 120* 105*  BUN 17   < >  --  15 17 10   CREATININE 0.98   < >  --  0.86 0.79 0.77  CALCIUM 8.9   < >  --  8.7* 8.7* 9.3  GFRNONAA >60   < >  --  >60 >60 >60  PROT 7.3  --  6.9  --   --  7.5  ALBUMIN 3.9  --  3.3*  --   --  4.0  AST 15  --  15  --   --  16  ALT 12  --  10  --   --  12  ALKPHOS 61  --  55  --   --  63  BILITOT 0.3  --  1.3*  --   --  0.5  BILIDIR  --   --  0.4*  --   --   --   IBILI  --   --  0.9  --   --   --    < > = values in this interval not displayed.    RADIOGRAPHIC STUDIES: I have personally reviewed the radiological images as listed and agreed with the findings in the report. DG Bone Density  Result Date: 08/02/2023 EXAM: DUAL X-RAY ABSORPTIOMETRY (DXA) FOR BONE MINERAL DENSITY IMPRESSION: Your patient Eveleen Denunzio completed a BMD test on 08/02/2023 using the [employer] DXA System (software version: 14.10) manufactured by [employer]. The following summarizes the results of our evaluation. Technologist: [employer] PATIENT BIOGRAPHICAL: Name: Brezlynn, Holman Patient ID: [id number] Birth Date: May 02, 1946 Height: 67.0 in. Gender: Female Exam Date: 08/02/2023 Weight: 133.4 lbs. Indications: Advanced Age, Asthma, Caucasian, Chronic Glucocorticoids, COPD, History of Breast Cancer, History of Radiation,  Postmenopausal, Tobacco User Fractures: Elbow Left Treatments: Anastrozole, Spiriva, Ventolin HFA DENSITOMETRY RESULTS: Site         Region     Measured Date Measured Age WHO Classification Young Adult T-score BMD         %Change vs. Previous Significant Change (*) AP Spine L1-L2 08/02/2024 76.1 Oste[age] -1.5 0.992 g/cm2 -2.4% - AP Spine L1-L2 04/22/2021 73.9 Osteopenia -1.3 1.016 g/cm2 - - DualFemur Neck Left 08/02/2023 76.1 Osteoporosis -2.8 0.652 g/cm2 -4.0% - DualFemur Neck Left 04/22/2021 73.9 Osteoporosis -2.6 0.679 g/cm2 - - DualFemur Total Mean 08/02/2023 76.1 Osteoporosis -2.5 0.699 g/cm2 -4.9% Yes DualFemur Total Mean 04/22/2021 73.9 Osteopenia -2.2 0.735 g/cm2 - - Left Forearm Radius 33% 08/02/2023 76.1 Osteopenia -2.0 0.699 g/cm2 - - ASSESSMENT: The BMD measured at Femur Neck Left is 0.652 g/cm2 with a T-score of -2.8. This patient is considered osteoporotic according to World Health Organization [facility]) criteria. The scan quality is good. L-3 & 4 was excluded due to degenerative changes. Compared with prior study, there has been no significant change in the spine. Compared with prior study, there has been significant decrease in the total hip. World Science writer [facility]) criteria for post-menopausal, Caucasian Women: Normal:                   T-score at or above -1 SD Osteopenia/low bone mass: T-score between -1 and -2.5 SD Osteoporosis:             T-score at or below -2.5 SD RECOMMENDATIONS: 1. All patients should optimize calcium and vitamin D intake. 2. Consider FDA-approved medical therapies in postmenopausal women and men aged 67 years and older, based on the following: a. A hip or vertebral(clinical or morphometric) fracture b. T-score < -2.5 at the femoral neck or spine after appropriate evaluation to exclude secondary causes c. Low bone mass (T-score between -1.0 and -2.5 at the femoral neck or spine) and a 10-year probability of a hip fracture > 3% or a 10-year probability of a major  osteoporosis-related fracture > 20% based on the [place]-adapted WHO algorithm 3. Clinician judgment and/or patient preferences may indicate treatment for people with 10-year fracture probabilities above or below these levels FOLLOW-UP: People with diagnosed cases of osteoporosis or at high risk for fracture should have regular bone mineral density tests. For patients eligible for Medicare, routine testing is allowed once every 2 years. The testing frequency can be increased to one year for patients who have rapidly progressing disease, those who are receiving or discontinuing medical therapy to restore bone mass, or have additional  risk factors. I have reviewed this report, and agree with the above findings. [facility] Electronically Signed   By: Frederico Hamman M.D.   On: 08/02/2023 10:47     ASSESSMENT & PLAN:   # Carcinoma of overlapping sites of left breast in female, estrogen receptor positive - stage I ER/PR positive HER-2 negative left breast cancer-invasive mammary carcinoma. S/p RT, completed June 2022. Currently on anastrozole. Dec 2023 mammogram was negative for new or progressive disease. Plan to repeat mammogram Dec 2024. Clinically asymptomatic. Plan to continue endocrine therapy for total of 5 years, completing summer 2027. We discussed symptoms taht would be concerning for recurrence and warrant sooner return. Mammograms have been ordered by surgery but patient requests to be followed at cancer center due to financial and time concerns. We will order mammogram and see her in 6 months.   # Neuropathic pain/post surgical changes- G2. Tolerating reduced dose of gabapentin well. No clinical concerns for recurrent disease.    # Osteoporosis : BMD T score -2.6 [July 2022]. Repeat Bone Density scan 08/02/2023 was reviewed with T score -2.8. Stable. She has self discontinued calcium and D. Recommended restarting along with weight bearing exercise  as tolerated. Plan to repeat 2026.     # Erythrocytosis Hb 15.8. Stable. Likely secondary to smoking. No suspicious for primary cause. Monitor.     # Smoking: Active smoker. CT managed by Dr. Meredeth Ide. Encouraged continued reduction and cessation.    # Wellness- she will continue to follow up with her pcp for management of other medical conditions and ongoing cancer screenings.    DISPOSITION:  Dec 2024- mammogram 6 mo- lab (cbc, cmp), Dr Donneta Romberg- [place]   No problem-specific Assessment & Plan notes found for this encounter.  All questions were answered. The patient/family knows to call the clinic with any problems, questions or concerns.    Alinda Dooms, NP 08/09/2023

## 2023-08-15 DIAGNOSIS — R0602 Shortness of breath: Secondary | ICD-10-CM | POA: Diagnosis not present

## 2023-08-15 DIAGNOSIS — J90 Pleural effusion, not elsewhere classified: Secondary | ICD-10-CM | POA: Diagnosis not present

## 2023-08-15 DIAGNOSIS — R634 Abnormal weight loss: Secondary | ICD-10-CM | POA: Diagnosis not present

## 2023-08-15 DIAGNOSIS — J449 Chronic obstructive pulmonary disease, unspecified: Secondary | ICD-10-CM | POA: Diagnosis not present

## 2023-08-15 DIAGNOSIS — F17210 Nicotine dependence, cigarettes, uncomplicated: Secondary | ICD-10-CM | POA: Diagnosis not present

## 2023-09-02 DIAGNOSIS — H5789 Other specified disorders of eye and adnexa: Secondary | ICD-10-CM | POA: Diagnosis not present

## 2023-09-02 DIAGNOSIS — K222 Esophageal obstruction: Secondary | ICD-10-CM | POA: Diagnosis not present

## 2023-09-02 DIAGNOSIS — E042 Nontoxic multinodular goiter: Secondary | ICD-10-CM | POA: Diagnosis not present

## 2023-09-02 DIAGNOSIS — I700 Atherosclerosis of aorta: Secondary | ICD-10-CM | POA: Diagnosis not present

## 2023-09-02 DIAGNOSIS — M5441 Lumbago with sciatica, right side: Secondary | ICD-10-CM | POA: Diagnosis not present

## 2023-09-02 DIAGNOSIS — R8290 Unspecified abnormal findings in urine: Secondary | ICD-10-CM | POA: Diagnosis not present

## 2023-09-02 DIAGNOSIS — Z720 Tobacco use: Secondary | ICD-10-CM | POA: Diagnosis not present

## 2023-09-02 DIAGNOSIS — R0982 Postnasal drip: Secondary | ICD-10-CM | POA: Diagnosis not present

## 2023-09-02 DIAGNOSIS — H5713 Ocular pain, bilateral: Secondary | ICD-10-CM | POA: Diagnosis not present

## 2023-09-02 DIAGNOSIS — F17210 Nicotine dependence, cigarettes, uncomplicated: Secondary | ICD-10-CM | POA: Diagnosis not present

## 2023-09-02 DIAGNOSIS — R7303 Prediabetes: Secondary | ICD-10-CM | POA: Diagnosis not present

## 2023-09-02 DIAGNOSIS — H43393 Other vitreous opacities, bilateral: Secondary | ICD-10-CM | POA: Diagnosis not present

## 2023-09-02 DIAGNOSIS — H539 Unspecified visual disturbance: Secondary | ICD-10-CM | POA: Diagnosis not present

## 2023-09-02 DIAGNOSIS — Z853 Personal history of malignant neoplasm of breast: Secondary | ICD-10-CM | POA: Diagnosis not present

## 2023-09-02 DIAGNOSIS — M4800 Spinal stenosis, site unspecified: Secondary | ICD-10-CM | POA: Diagnosis not present

## 2023-09-02 DIAGNOSIS — J449 Chronic obstructive pulmonary disease, unspecified: Secondary | ICD-10-CM | POA: Diagnosis not present

## 2023-09-02 DIAGNOSIS — C50812 Malignant neoplasm of overlapping sites of left female breast: Secondary | ICD-10-CM | POA: Diagnosis not present

## 2023-09-02 DIAGNOSIS — F329 Major depressive disorder, single episode, unspecified: Secondary | ICD-10-CM | POA: Diagnosis not present

## 2023-09-02 DIAGNOSIS — M542 Cervicalgia: Secondary | ICD-10-CM | POA: Diagnosis not present

## 2023-09-02 DIAGNOSIS — E049 Nontoxic goiter, unspecified: Secondary | ICD-10-CM | POA: Diagnosis not present

## 2023-09-02 DIAGNOSIS — R7309 Other abnormal glucose: Secondary | ICD-10-CM | POA: Diagnosis not present

## 2023-09-02 DIAGNOSIS — F331 Major depressive disorder, recurrent, moderate: Secondary | ICD-10-CM | POA: Diagnosis not present

## 2023-09-15 DIAGNOSIS — E042 Nontoxic multinodular goiter: Secondary | ICD-10-CM | POA: Diagnosis not present

## 2023-09-20 ENCOUNTER — Ambulatory Visit: Payer: Medicare HMO | Attending: Cardiology

## 2023-09-20 DIAGNOSIS — I3139 Other pericardial effusion (noninflammatory): Secondary | ICD-10-CM

## 2023-09-21 LAB — ECHOCARDIOGRAM LIMITED
AR max vel: 2.34 cm2
AV Area VTI: 2.34 cm2
AV Area mean vel: 2.35 cm2
AV Mean grad: 2 mm[Hg]
AV Peak grad: 5.2 mm[Hg]
Ao pk vel: 1.14 m/s
Area-P 1/2: 2.71 cm2
S' Lateral: 2.6 cm

## 2023-09-22 DIAGNOSIS — H04123 Dry eye syndrome of bilateral lacrimal glands: Secondary | ICD-10-CM | POA: Diagnosis not present

## 2023-09-22 DIAGNOSIS — H43813 Vitreous degeneration, bilateral: Secondary | ICD-10-CM | POA: Diagnosis not present

## 2023-09-22 DIAGNOSIS — H0100A Unspecified blepharitis right eye, upper and lower eyelids: Secondary | ICD-10-CM | POA: Diagnosis not present

## 2023-09-22 DIAGNOSIS — Z961 Presence of intraocular lens: Secondary | ICD-10-CM | POA: Diagnosis not present

## 2023-10-24 ENCOUNTER — Ambulatory Visit
Admission: RE | Admit: 2023-10-24 | Discharge: 2023-10-24 | Disposition: A | Discharge status: Home / Self Care | Payer: Medicare HMO | Source: Ambulatory Visit | Attending: Nurse Practitioner | Admitting: Nurse Practitioner

## 2023-10-24 DIAGNOSIS — Z08 Encounter for follow-up examination after completed treatment for malignant neoplasm: Secondary | ICD-10-CM | POA: Insufficient documentation

## 2023-10-24 DIAGNOSIS — Z853 Personal history of malignant neoplasm of breast: Secondary | ICD-10-CM | POA: Insufficient documentation

## 2023-10-24 DIAGNOSIS — R92333 Mammographic heterogeneous density, bilateral breasts: Secondary | ICD-10-CM | POA: Diagnosis not present

## 2023-12-15 DIAGNOSIS — E042 Nontoxic multinodular goiter: Secondary | ICD-10-CM | POA: Diagnosis not present

## 2023-12-15 DIAGNOSIS — Z720 Tobacco use: Secondary | ICD-10-CM | POA: Diagnosis not present

## 2023-12-15 DIAGNOSIS — R7989 Other specified abnormal findings of blood chemistry: Secondary | ICD-10-CM | POA: Diagnosis not present

## 2023-12-15 DIAGNOSIS — M810 Age-related osteoporosis without current pathological fracture: Secondary | ICD-10-CM | POA: Diagnosis not present

## 2023-12-28 DIAGNOSIS — J449 Chronic obstructive pulmonary disease, unspecified: Secondary | ICD-10-CM | POA: Diagnosis not present

## 2023-12-28 DIAGNOSIS — Z8719 Personal history of other diseases of the digestive system: Secondary | ICD-10-CM | POA: Diagnosis not present

## 2023-12-28 DIAGNOSIS — K5904 Chronic idiopathic constipation: Secondary | ICD-10-CM | POA: Diagnosis not present

## 2023-12-28 DIAGNOSIS — Z860101 Personal history of adenomatous and serrated colon polyps: Secondary | ICD-10-CM | POA: Diagnosis not present

## 2023-12-28 DIAGNOSIS — R09A2 Foreign body sensation, throat: Secondary | ICD-10-CM | POA: Diagnosis not present

## 2023-12-28 DIAGNOSIS — K224 Dyskinesia of esophagus: Secondary | ICD-10-CM | POA: Diagnosis not present

## 2023-12-28 DIAGNOSIS — R110 Nausea: Secondary | ICD-10-CM | POA: Diagnosis not present

## 2023-12-28 DIAGNOSIS — K219 Gastro-esophageal reflux disease without esophagitis: Secondary | ICD-10-CM | POA: Diagnosis not present

## 2023-12-28 DIAGNOSIS — F17210 Nicotine dependence, cigarettes, uncomplicated: Secondary | ICD-10-CM | POA: Diagnosis not present

## 2024-01-02 DIAGNOSIS — F17210 Nicotine dependence, cigarettes, uncomplicated: Secondary | ICD-10-CM | POA: Diagnosis not present

## 2024-01-02 DIAGNOSIS — E785 Hyperlipidemia, unspecified: Secondary | ICD-10-CM | POA: Diagnosis not present

## 2024-01-02 DIAGNOSIS — Z0000 Encounter for general adult medical examination without abnormal findings: Secondary | ICD-10-CM | POA: Diagnosis not present

## 2024-01-02 DIAGNOSIS — R7309 Other abnormal glucose: Secondary | ICD-10-CM | POA: Diagnosis not present

## 2024-01-02 DIAGNOSIS — R110 Nausea: Secondary | ICD-10-CM | POA: Diagnosis not present

## 2024-01-02 DIAGNOSIS — Z853 Personal history of malignant neoplasm of breast: Secondary | ICD-10-CM | POA: Diagnosis not present

## 2024-01-02 DIAGNOSIS — F329 Major depressive disorder, single episode, unspecified: Secondary | ICD-10-CM | POA: Diagnosis not present

## 2024-01-02 DIAGNOSIS — J449 Chronic obstructive pulmonary disease, unspecified: Secondary | ICD-10-CM | POA: Diagnosis not present

## 2024-01-02 DIAGNOSIS — J3089 Other allergic rhinitis: Secondary | ICD-10-CM | POA: Diagnosis not present

## 2024-01-02 DIAGNOSIS — E042 Nontoxic multinodular goiter: Secondary | ICD-10-CM | POA: Diagnosis not present

## 2024-01-02 DIAGNOSIS — G4700 Insomnia, unspecified: Secondary | ICD-10-CM | POA: Diagnosis not present

## 2024-01-02 DIAGNOSIS — R29898 Other symptoms and signs involving the musculoskeletal system: Secondary | ICD-10-CM | POA: Diagnosis not present

## 2024-01-02 DIAGNOSIS — R7303 Prediabetes: Secondary | ICD-10-CM | POA: Diagnosis not present

## 2024-01-13 DIAGNOSIS — R209 Unspecified disturbances of skin sensation: Secondary | ICD-10-CM | POA: Diagnosis not present

## 2024-01-13 DIAGNOSIS — R29898 Other symptoms and signs involving the musculoskeletal system: Secondary | ICD-10-CM | POA: Diagnosis not present

## 2024-01-19 ENCOUNTER — Encounter: Payer: Self-pay | Admitting: Cardiology

## 2024-01-19 ENCOUNTER — Ambulatory Visit: Payer: Medicare HMO | Attending: Cardiology | Admitting: Cardiology

## 2024-01-19 VITALS — BP 130/74 | HR 88 | Ht 67.0 in | Wt 140.8 lb

## 2024-01-19 DIAGNOSIS — J432 Centrilobular emphysema: Secondary | ICD-10-CM | POA: Diagnosis not present

## 2024-01-19 DIAGNOSIS — I3139 Other pericardial effusion (noninflammatory): Secondary | ICD-10-CM | POA: Diagnosis not present

## 2024-01-19 DIAGNOSIS — I491 Atrial premature depolarization: Secondary | ICD-10-CM

## 2024-01-19 DIAGNOSIS — R072 Precordial pain: Secondary | ICD-10-CM | POA: Diagnosis not present

## 2024-01-19 NOTE — Patient Instructions (Signed)
 Medication Instructions:   Your Physician recommend you continue on your current medication as directed.     *If you need a refill on your cardiac medications before your next appointment, please call your pharmacy*   Lab Work: None ordered.   If you have labs (blood work) drawn today and your tests are completely normal, you will receive your results only by: MyChart Message (if you have MyChart) OR A paper copy in the mail If you have any lab test that is abnormal or we need to change your treatment, we will call you to review the results.   Testing/Procedures: None ordered.   Follow-Up: At [facility], you and your health needs are our priority.  As part of our continuing mission to provide you with exceptional heart care, we have created designated Provider Care Teams.  These Care Teams include your primary Cardiologist (physician) and Advanced Practice Providers (APPs -  Physician Assistants and Nurse Practitioners) who all work together to provide you with the care you need, when you need it.  We recommend signing up for the patient portal called "MyChart".  Sign up information is provided on this After Visit Summary.  MyChart is used to connect with patients for Virtual Visits (Telemedicine).  Patients are able to view lab/test results, encounter notes, upcoming appointments, etc.  Non-urgent messages can be sent to your provider as well.   To learn more about what you can do with MyChart, go to [email or web address].    Your next appointment:   Follow up as needed  Provider:   You may see Dr. Herbie Baltimore or one of the following Advanced Practice Providers on your designated Care Team:   Nicolasa Ducking, NP Eula Listen, PA-C Cadence Fransico Michael, PA-C Charlsie Quest, NP Carlos Levering, NP

## 2024-01-19 NOTE — Progress Notes (Signed)
 Cardiology Office Note:  .   Date:  01/22/2024  ID:  Morgan Valdez, DOB 01/08/1946, MRN [id number] PCP: Barbette Reichmann, MD  [facility] Providers Cardiologist:  None     Chief Complaint  Patient presents with   Follow-up    Has some SOB while lying in bed sometimes, medication reviewed verbally with patient     Patient Profile: .     Morgan Valdez is a  78 y.o. female currently light smoker with a PMH notable for COPD who presents here for 11-month follow-up at the request of Barbette Reichmann, MD.  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  CT scan suggested possible pericardial effusion.  We did order 2D echocardiogram but also Coronary CTA.  Based on Echo results, via telephone, I recommend that she start colchicine and ibuprofen taper: Colchicine 0.6 mg p.o. twice daily for 1 week and 1 tablet daily for 4 weeks (20 days); dispense #42 tablets, no refill Takes over-the-counter ibuprofen 600 mg twice daily x 3 days, 400 mg twice daily x 3 days then 20 mg twice daily for 3 days (ensure that this is taking with food in the stomach, and at least 1 glass of water per dose)  Was seen in the [facility] ER on May 12, 2023 for chest pain.  Stated that her symptoms were ongoing and unchanged.     Morgan Valdez was last seen on 07/21/2023 -> plan was to recheck Echo for Pericardial Effusion otherwise doing pretty well. She has been followed by [facility] most recent clinic note from February 26 reviewed.  Subjective  Discussed the use of AI scribe software for clinical note transcription with the patient, who gave verbal consent to proceed.  History of Present Illness   Morgan Valdez is a 78 year old female who  presents for follow-up of cardiac studies to re-evaluate for recurrent/persistent pericardial effusion following treatment for Pericarditis.  An echocardiogram in June 2024 (ordered for evaluation of chest pain) showed a small pericardial effusion, and a Coronary CT Angiogram in August did not reveal any significant stenoses. She mentions a recent blood flow test at [facility], but the results have not yet been posted.  She experiences occasional chest discomfort, which she attributes more to gastrointestinal issues - fleeting burning sensation not associated with any particular activity, occurring both @ rest & with exertion. No recent episodes of dizziness, syncope, or feeling like her heart is racing. She denies waking up at night with breathing difficulties. - NO PND or orthopnea.    She experiences intermittent palpitations, described as feeling like her heart beats harder than usual, but these episodes are brief and resolve spontaneously. Her son has similar palpitations, which were evaluated in [place] and found to be benign.  No prolonged episodes of rapid or irregular heartbeats.   No syncope/significant berry TIA/amaurosis fugax.  No claudication.  She sleeps on two pillows and sometimes experiences mild shortness of breath while lying down, which resolves on its own.  She notes swelling in her feet, particularly in the mornings, which improves with activity and elevating her feet.  She has been experiencing depression and attributes some of her symptoms to this condition.  She does note occasional episodes of feeling short of breath lying down at  night, but does not wake up short of breath, and is not short breath at first lying down.  Usually she just rolls over or gets something to drink.       Objective    Studies Reviewed: Marland Kitchen   EKG Interpretation Date/Time:  Thursday January 19 2024 09:23:15 EDT Ventricular Rate:  88 PR Interval:  136 QRS Duration:  72 QT Interval:  358 QTC  Calculation: 433 R Axis:   46  Text Interpretation: Sinus rhythm with Premature atrial complexes When compared with ECG of 12-May-2023 09:18, Premature atrial complexes are now Present Nonspecific T wave abnormality no longer evident in Lateral leads Confirmed by Bryan Lemma ([id number]) on 01/19/2024 9:48:09 AM    Cor CTA 05/11/2023: CAC Score 0. No evidence of CAD  ECHO 09/20/2023:  1. Left ventricular ejection fraction, by estimation, is 55 to 60%. The left ventricle has normal function. The left ventricle has no regional  wall motion abnormalities. Left ventricular diastolic parameters are consistent with Grade I diastolic dysfunction (impaired relaxation).  2. Right ventricular systolic function is normal. The right ventricular size is normal. There is normal pulmonary artery systolic pressure. The  estimated right ventricular systolic pressure is 34.4 mmHg.  3. The mitral valve is normal in structure. No evidence of mitral valve regurgitation. No evidence of mitral stenosis.  4. The aortic valve is normal in structure. Aortic valve regurgitation is not visualized. No aortic stenosis is present.  5. The inferior vena cava is normal in size with greater than 50% respiratory variability, suggesting right atrial pressure of 3 mmHg.  6. No pericardial effusion  Comparison(s) 04/2023: EF 55%, small-moderate pericardial effusion, increased RV wall thickness. RAP ~8 mmHg   Risk Assessment/Calculations:         Physical Exam:   VS:  BP 130/74 (BP Location: Right Arm, Patient Position: Sitting, Cuff Size: Normal)   Pulse 88   Ht 5\' 7"  (1.702 m)   Wt 140 lb 12.8 oz (63.9 kg)   SpO2 96%   BMI 22.05 kg/m    Wt Readings from Last 3 Encounters:  01/19/24 140 lb 12.8 oz (63.9 kg)  08/09/23 137 lb (62.1 kg)  07/21/23 132 lb 12.8 oz (60.2 kg)    GEN: Well nourished, well groomed in no acute distress; healthy-appearing NECK: No JVD; No carotid bruits CARDIAC: Normal S1, S2; RRR, no murmurs, rubs,  gallops RESPIRATORY:  Clear to auscultation without rales, wheezing or rhonchi ; nonlabored, good air movement. ABDOMEN: Soft, non-tender, non-distended EXTREMITIES:  No edema; No deformity     ASSESSMENT AND PLAN: .    Problem List Items Addressed This Visit       Cardiology Problems   Pericardial effusion - Primary (Chronic)   Resolved pericardial effusion and normalized pressures on echocardiogram, likely due to transient inflammation. - Discharge to primary care for routine follow-up.      Relevant Orders   EKG 12-Lead (Completed)   Premature atrial contractions (Chronic)     Other   Emphysema lung (HCC) (Chronic)   Mildly elevated right heart pressures possibly related to COPD. No acute exacerbation or significant respiratory symptoms. Echocardiogram findings consistent with COPD. - Continue current management for COPD. - Follow up with pulmonologist as needed.      Precordial pain (Chronic)   No longer really having much in the way of any concerning symptoms.  Nothing exertional.  Recheck of echocardiogram did not show residual pericardial effusion.  Most likely musculoskeletal chest pains.  Relevant Orders   EKG 12-Lead (Completed)     Depression Depression acknowledged as impacting physical symptoms. - Consider discussing mental health support with primary care provider.  Follow-up No further cardiology follow-up needed with normal echocardiogram and CT scan results unless new symptoms arise. - Follow up with primary care as needed. - Return to cardiology if new cardiac symptoms arise.: Return for Followup when necessary, [place] office.    Signed, Marykay Lex, MD, MS Bryan Lemma, M.D., M.S. Interventional Cardiologist  [facility]  Pager # [phone] Phone # [phone] [address] [place], [place] [address]

## 2024-01-22 ENCOUNTER — Encounter: Payer: Self-pay | Admitting: Cardiology

## 2024-01-22 DIAGNOSIS — I491 Atrial premature depolarization: Secondary | ICD-10-CM | POA: Insufficient documentation

## 2024-01-22 NOTE — Assessment & Plan Note (Signed)
 No longer really having much in the way of any concerning symptoms.  Nothing exertional.  Recheck of echocardiogram did not show residual pericardial effusion.  Most likely musculoskeletal chest pains.

## 2024-01-22 NOTE — Assessment & Plan Note (Signed)
 Resolved pericardial effusion and normalized pressures on echocardiogram, likely due to transient inflammation. - Discharge to primary care for routine follow-up.

## 2024-01-22 NOTE — Assessment & Plan Note (Signed)
 Mildly elevated right heart pressures possibly related to COPD. No acute exacerbation or significant respiratory symptoms. Echocardiogram findings consistent with COPD. - Continue current management for COPD. - Follow up with pulmonologist as needed.

## 2024-02-06 ENCOUNTER — Other Ambulatory Visit: Payer: Self-pay | Admitting: *Deleted

## 2024-02-06 DIAGNOSIS — C50812 Malignant neoplasm of overlapping sites of left female breast: Secondary | ICD-10-CM

## 2024-02-07 ENCOUNTER — Inpatient Hospital Stay (HOSPITAL_BASED_OUTPATIENT_CLINIC_OR_DEPARTMENT_OTHER): Payer: Medicare HMO | Admitting: Internal Medicine

## 2024-02-07 ENCOUNTER — Encounter: Payer: Self-pay | Admitting: Internal Medicine

## 2024-02-07 ENCOUNTER — Inpatient Hospital Stay: Payer: Medicare HMO | Attending: Internal Medicine

## 2024-02-07 VITALS — BP 129/61 | HR 85 | Temp 97.0°F | Resp 17 | Wt 140.0 lb

## 2024-02-07 DIAGNOSIS — D751 Secondary polycythemia: Secondary | ICD-10-CM | POA: Diagnosis not present

## 2024-02-07 DIAGNOSIS — M810 Age-related osteoporosis without current pathological fracture: Secondary | ICD-10-CM | POA: Insufficient documentation

## 2024-02-07 DIAGNOSIS — Z79899 Other long term (current) drug therapy: Secondary | ICD-10-CM | POA: Insufficient documentation

## 2024-02-07 DIAGNOSIS — Z1732 Human epidermal growth factor receptor 2 negative status: Secondary | ICD-10-CM | POA: Insufficient documentation

## 2024-02-07 DIAGNOSIS — C50812 Malignant neoplasm of overlapping sites of left female breast: Secondary | ICD-10-CM | POA: Insufficient documentation

## 2024-02-07 DIAGNOSIS — Z79811 Long term (current) use of aromatase inhibitors: Secondary | ICD-10-CM | POA: Insufficient documentation

## 2024-02-07 DIAGNOSIS — Z170 Estrogen receptor positive status [ER+]: Secondary | ICD-10-CM | POA: Insufficient documentation

## 2024-02-07 DIAGNOSIS — F17210 Nicotine dependence, cigarettes, uncomplicated: Secondary | ICD-10-CM | POA: Insufficient documentation

## 2024-02-07 DIAGNOSIS — Z1721 Progesterone receptor positive status: Secondary | ICD-10-CM | POA: Diagnosis not present

## 2024-02-07 LAB — CBC WITH DIFFERENTIAL (CANCER CENTER ONLY)
Abs Immature Granulocytes: 0.02 10*3/uL (ref 0.00–0.07)
Basophils Absolute: 0 10*3/uL (ref 0.0–0.1)
Basophils Relative: 1 %
Eosinophils Absolute: 0.1 10*3/uL (ref 0.0–0.5)
Eosinophils Relative: 1 %
HCT: 48.4 % — ABNORMAL HIGH (ref 36.0–46.0)
Hemoglobin: 15.9 g/dL — ABNORMAL HIGH (ref 12.0–15.0)
Immature Granulocytes: 0 %
Lymphocytes Relative: 27 %
Lymphs Abs: 2.1 10*3/uL (ref 0.7–4.0)
MCH: 31.4 pg (ref 26.0–34.0)
MCHC: 32.9 g/dL (ref 30.0–36.0)
MCV: 95.5 fL (ref 80.0–100.0)
Monocytes Absolute: 0.6 10*3/uL (ref 0.1–1.0)
Monocytes Relative: 8 %
Neutro Abs: 4.9 10*3/uL (ref 1.7–7.7)
Neutrophils Relative %: 63 %
Platelet Count: 252 10*3/uL (ref 150–400)
RBC: 5.07 MIL/uL (ref 3.87–5.11)
RDW: 13.3 % (ref 11.5–15.5)
WBC Count: 7.8 10*3/uL (ref 4.0–10.5)
nRBC: 0 % (ref 0.0–0.2)

## 2024-02-07 LAB — CMP (CANCER CENTER ONLY)
ALT: 11 U/L (ref 0–44)
AST: 15 U/L (ref 15–41)
Albumin: 3.9 g/dL (ref 3.5–5.0)
Alkaline Phosphatase: 59 U/L (ref 38–126)
Anion gap: 11 (ref 5–15)
BUN: 13 mg/dL (ref 8–23)
CO2: 24 mmol/L (ref 22–32)
Calcium: 9.1 mg/dL (ref 8.9–10.3)
Chloride: 103 mmol/L (ref 98–111)
Creatinine: 0.81 mg/dL (ref 0.44–1.00)
GFR, Estimated: >60 mL/min (ref >60)
Glucose, Bld: 106 mg/dL — ABNORMAL HIGH (ref 70–99)
Potassium: 4.6 mmol/L (ref 3.5–5.1)
Sodium: 138 mmol/L (ref 135–145)
Total Bilirubin: 0.6 mg/dL (ref 0.0–1.2)
Total Protein: 6.9 g/dL (ref 6.5–8.1)

## 2024-02-07 MED ORDER — GABAPENTIN 100 MG PO CAPS: 100.0000 mg | ORAL_CAPSULE | Freq: Two times a day (BID) | ORAL | 1 refills | Status: AC | PRN

## 2024-02-07 NOTE — Patient Instructions (Signed)
 Recommend dental clearance re: Zolendronic acid.

## 2024-02-07 NOTE — Assessment & Plan Note (Addendum)
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 S/p RT [finished June 1st week, 2022].  Currently on anastrozole; [Dr.Diaz] DEC 2024 BIL- mammo-WNL. Stable.   # Tolerating well- continue  anastrozole for total of 5 years [summer 2027].  # Post Lumpectomy/neuropathic pain- G-2-3- [previously on gabapentin for back].  No clinical concerns for recurrent disease.  Will try to take home dose 100 gabapentin-    # Osteoporosis : BMD T score -2.6 [July 2022]; OCT 2024-  T-score of -2.8 on calcium plus vitamin D-Discussed the potential risk factors for osteoporosis- age/gender/postmenopausal status/use of anti-estrogen treatments. Discussed multiple options including exercise/ calcium and vitamin D supplementation/ and also use of bisphosphonates. Discussed oral bisphosphonates versus parenteral bisphosphonate [declines to Hx of GERD]. Also like Reclast  Discussed the potential benefits and/side effects  Including but not limited to Osteonecrosis of jaw/ hypocalcemia. Recommend dental clearance re: Zolendronic acid.   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  Monitor for now.  Do not suspect any primary causes.  stable.  # Smoking: Active smoker; counseled to quit smoking [< 1/2ppd-quitting]. On LDCT-March 2023 [Dr.Fleming]- stable.   # DISPOSITION:  # follow up in 6 months- MD;  labs-cbc/cmp; possible reclast --  Dr.B

## 2024-02-07 NOTE — Progress Notes (Signed)
 [facility] CONSULT NOTE  Patient Care Team: Barbette Reichmann, MD as PCP - General (Internal Medicine) Scarlett Presto, RN (Inactive) as Oncology Nurse Navigator Earna Coder, MD as Consulting Physician (Internal Medicine) Carmina Miller, MD as Referring Physician (Radiation Oncology) Carolan Shiver, MD as Consulting Physician (General Surgery)  CHIEF COMPLAINTS/PURPOSE OF CONSULTATION: Breast cancer  #  Oncology History Overview Note  #  BREAST, LEFT 2:00; ULTRASOUND-GUIDED BIOPSY:  - INVASIVE MAMMARY CARCINOMA WITH FEATURES SUGGESTIVE OF SOLID  PSEUDOPAPILLARY CARCINOMA; ER >90%; PR > 90%; he 2 Neu-NEG [Dr.Cinton]  PATHOLOGIC STAGE CLASSIFICATION (pTNM, AJCC 8th Edition):  TNM Descriptors: Not applicable  pT1a  Regional Lymph Nodes Modifier: sn  pN0  pM - Not applicable   SPECIAL STUDIES  Breast Biomarker Testing Performed on Previous Biopsy: [id number]  Estrogen Receptor (ER) Status: POSITIVE          Percentage of cells with nuclear positivity: Greater than 90%          Average intensity of staining: Strong   Progesterone Receptor (PgR) Status: POSITIVE          Percentage of cells with nuclear positivity: Greater than 90%          Average intensity of staining: Moderate   HER2 (by immunohistochemistry): NEGATIVE (Score 1+)  #Cancer screening : colonoscopy [since 56 years; again aug 2022];smoker;        Carcinoma of overlapping sites of left breast in female, estrogen receptor positive (HCC)  01/19/2021 Initial Diagnosis   Carcinoma of overlapping sites of left breast in female, estrogen receptor positive (HCC)   01/19/2021 Cancer Staging   Staging form: Breast, AJCC 8th Edition - Clinical: Stage IA (cT1a, cN0, cM0, G2, ER+, PR+, HER2-) - Signed by Earna Coder, MD on 01/19/2021 Histologic grading system: 3 grade system    Genetic Testing   Negative genetic testing. No pathogenic variants identified on the [facility]  Multi-Cancer Panel+RNA. The report date is 03/18/2021.  The Multi-Cancer Panel + RNA offered by [facility] includes sequencing and/or deletion duplication testing of the following 84 genes: AIP, ALK, APC, ATM, AXIN2,BAP1,  BARD1, BLM, BMPR1A, BRCA1, BRCA2, BRIP1, CASR, CDC73, CDH1, CDK4, CDKN1B, CDKN1C, CDKN2A (p14ARF), CDKN2A (p16INK4a), CEBPA, CHEK2, CTNNA1, DICER1, DIS3L2, EGFR (c.2369C>T, p.Thr790Met variant only), EPCAM (Deletion/duplication testing only), FH, FLCN, GATA2, GPC3, GREM1 (Promoter region deletion/duplication testing only), HOXB13 (c.251G>A, p.Gly84Glu), HRAS, KIT, MAX, MEN1, MET, MITF (c.952G>A, p.Glu318Lys variant only), MLH1, MSH2, MSH3, MSH6, MUTYH, NBN, NF1, NF2, NTHL1, PALB2, PDGFRA, PHOX2B, PMS2, POLD1, POLE, POT1, PRKAR1A, PTCH1, PTEN, RAD50, RAD51C, RAD51D, RB1, RECQL4, RET, RUNX1, SDHAF2, SDHA (sequence changes only), SDHB, SDHC, SDHD, SMAD4, SMARCA4, SMARCB1, SMARCE1, STK11, SUFU, TERC, TERT, TMEM127, TP53, TSC1, TSC2, VHL, WRN and WT1.    HISTORY OF PRESENTING ILLNESS: Ambulating independently.  Alone.  Morgan Valdez 78 y.o.  female stage I LEFT breast cancer ER/PR positive HER2 negative-anastrozole is here for follow-up.  Patient continues to have some pain under left breast, she was told from scar tissue or nerve damage after surgery. Pt states it is getting worse and pain is moving towards her right breast.   Taking her anastrazole, denies side effect   Review of Systems  Constitutional:  Negative for chills, diaphoresis, fever, malaise/fatigue and weight loss.  HENT:  Negative for nosebleeds and sore throat.   Eyes:  Negative for double vision.  Respiratory:  Negative for cough, hemoptysis, sputum production, shortness of breath and wheezing.   Cardiovascular:  Negative for chest pain, palpitations, orthopnea and  leg swelling.  Gastrointestinal:  Negative for abdominal pain, blood in stool, constipation, diarrhea, heartburn, melena, nausea and vomiting.   Genitourinary:  Negative for dysuria, frequency and urgency.  Musculoskeletal:  Positive for back pain. Negative for joint pain.  Skin:  Negative for itching.  Neurological:  Negative for dizziness, tingling, focal weakness, weakness and headaches.  Endo/Heme/Allergies:  Does not bruise/bleed easily.  Psychiatric/Behavioral:  Negative for depression. The patient is not nervous/anxious and does not have insomnia.      MEDICAL HISTORY:  Past Medical History:  Diagnosis Date   Allergic state    Anxiety    Arthritis    Barrett's esophagus    Breast cancer (HCC)    Left   Chronic cough    COPD (chronic obstructive pulmonary disease) (HCC)    Depression    Duodenitis    Emphysema of lung (HCC)    Esophageal motility disorder    Family history of adrenal cancer    Family history of brain cancer    Family history of colon cancer    Family history of lung cancer    Family history of melanoma    Gastritis    GERD (gastroesophageal reflux disease)    Headache    migraines - 3-4x/yr   Osteoporosis, post-menopausal    Personal history of radiation therapy 2022   breast cancer   Pre-diabetes    Tobacco abuse disorder    Varicose veins of legs     SURGICAL HISTORY: Past Surgical History:  Procedure Laterality Date   APPENDECTOMY     BREAST BIOPSY Left 01/02/2021   [facility] WITH FEATURES SUGGESTIVE OF SOLID PSEUDOPAPILLARY CARCINOMA   BREAST LUMPECTOMY Left 12/2020   SOLID PAPILLARY CARCINOMA, 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   BREAST LUMPECTOMY,RADIO FREQ LOCALIZER,AXILLARY SENTINEL LYMPH NODE BIOPSY Left 01/28/2021   Procedure: BREAST LUMPECTOMY,RADIO FREQ LOCALIZER,AXILLARY SENTINEL LYMPH NODE BIOPSY;  Surgeon: Carolan Shiver, MD;  Location: [facility] ORS;  Service: General;  Laterality: Left;   CATARACT EXTRACTION W/PHACO Right 01/03/2017   Procedure: CATARACT EXTRACTION PHACO AND INTRAOCULAR LENS PLACEMENT (IOC)  Right;  Surgeon: Sherald Hess, MD;  Location[facility];  Service: Ophthalmology;  Laterality: Right;   CATARACT EXTRACTION W/PHACO Left 01/17/2017   Procedure: CATARACT EXTRACTION PHACO AND INTRAOCULAR LENS PLACEMENT (IOC);  Surgeon: Sherald Hess, MD;  Location: [facility]ERY [facility];  Service: Ophthalmology;  Laterality: Left;  left   CHOLECYSTECTOMY     COLONOSCOPY WITH PROPOFOL N/A 04/06/2018   Procedure: COLONOSCOPY WITH PROPOFOL;  Surgeon: Christena Deem, MD;  Location: [facility] ENDOSCOPY;  Service: Endoscopy;  Laterality: N/A;   COLONOSCOPY WITH PROPOFOL N/A 06/08/2021   Procedure: COLONOSCOPY WITH PROPOFOL;  Surgeon: Toledo, Boykin Nearing, MD;  Location: [facility] ENDOSCOPY;  Service: Gastroenterology;  Laterality: N/A;   ESOPHAGOGASTRODUODENOSCOPY N/A 09/26/2018   Procedure: ESOPHAGOGASTRODUODENOSCOPY (EGD);  Surgeon: Christena Deem, MD;  Location: [facility] ENDOSCOPY;  Service: Endoscopy;  Laterality: N/A;   ESOPHAGOGASTRODUODENOSCOPY (EGD) WITH PROPOFOL N/A 10/14/2015   Procedure: ESOPHAGOGASTRODUODENOSCOPY (EGD) WITH PROPOFOL;  Surgeon: Christena Deem, MD;  Location: [facility] ENDOSCOPY;  Service: Endoscopy;  Laterality: N/A;   EYE SURGERY Bilateral 2019   cataracts    FRACTURE SURGERY     HERNIA REPAIR      SOCIAL HISTORY: Social History   Socioeconomic History   Marital status: Widowed    Spouse name: Not on file   Number of children: Not on file   Years of education: Not on file   Highest education level: Not on  file  Occupational History   Not on file  Tobacco Use   Smoking status: Some Days    Current packs/day: 0.50    Average packs/day: 0.5 packs/day for 50.0 years (25.0 ttl pk-yrs)    Types: Cigarettes   Smokeless tobacco: Never  Vaping Use   Vaping status: Never Used  Substance and Sexual Activity   Alcohol use: Yes    Alcohol/week: 1.0 standard drink of alcohol    Types: 1 Glasses of wine per week    Comment: rare - Holidays   Drug use: No   Sexual activity: Not on file  Other Topics Concern    Not on file  Social History Narrative   Lives in [place]; 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; smoke- 1/2 ppd [since 17 years]; no alcohol   Social Drivers of Corporate investment banker Strain: Low Risk  (01/02/2024)   Received from [facility]   Overall Financial Resource Strain (CARDIA)    Difficulty of Paying Living Expenses: Not hard at all  Food Insecurity: No Food Insecurity (01/02/2024)   Received from [facility]   Hunger Vital Sign    Worried About Running Out of Food in the Last Year: Never true    Ran Out of Food in the Last Year: Never true  Transportation Needs: No Transportation Needs (01/02/2024)   Received from [facility] - Transportation    In the past 12 months, has lack of transportation kept you from medical appointments or from getting medications?: No    Lack of Transportation (Non-Medical): No  Physical Activity: Not on file  Stress: Not on file  Social Connections: Not on file  Intimate Partner Violence: Not on file    FAMILY HISTORY: Family History  Problem Relation Age of Onset   Cancer Mother        esophagus, spine. adrenal gland; lung.    Dementia Father    Cancer Sister        colon cancer- 40s to liver.    Cancer Maternal Grandmother        brain cancer   Diabetes Maternal Grandfather    Stroke Maternal Grandfather    Rectal cancer Daughter        at 15 years; survived.    Melanoma Grandson        dx 22 on neck   Breast cancer Neg Hx     ALLERGIES:  is allergic to tape, nicotine, and bacitracin.  MEDICATIONS:  Current Outpatient Medications  Medication Sig Dispense Refill   albuterol (VENTOLIN HFA) 108 (90 Base) MCG/ACT inhaler Inhale into the lungs.     anastrozole (ARIMIDEX) 1 MG tablet TAKE 1 TABLET EVERY DAY 90 tablet 3   cetirizine (ZYRTEC) 10 MG tablet Take 10 mg by mouth daily as needed for allergies.     cyanocobalamin (VITAMIN B12) 1000 MCG tablet Take by mouth.      esomeprazole (NEXIUM) 40 MG capsule Take 40 mg by mouth 2 (two) times daily.     SPIRIVA HANDIHALER 18 MCG inhalation capsule Place 1 capsule into inhaler and inhale in the morning.     topiramate (TOPAMAX) 25 MG tablet Take 25 mg by mouth daily as needed.     traZODone (DESYREL) 50 MG tablet Take 1 tablet by mouth at bedtime.     Vitamin D, Ergocalciferol, (DRISDOL) 1.25 MG (50000 UNIT) CAPS capsule Take 50,000 Units by mouth once a week.     buPROPion [facility]  XL) 300 MG 24 hr tablet Take by mouth. (Patient not taking: Reported on 02/07/2024)     gabapentin (NEURONTIN) 100 MG capsule Take 1 capsule (100 mg total) by mouth 2 (two) times daily as needed. 180 capsule 1   ondansetron (ZOFRAN) 4 MG tablet Take 4 mg by mouth 2 (two) times daily as needed for nausea/vomiting. (Patient not taking: Reported on 02/07/2024)  0   No current facility-administered medications for this visit.      Marland Kitchen  PHYSICAL EXAMINATION: ECOG PERFORMANCE STATUS: 0 - Asymptomatic  Vitals:   02/07/24 0936  BP: 129/61  Pulse: 85  Resp: 17  Temp: (!) 97 F (36.1 C)  SpO2: 98%    Filed Weights   02/07/24 0936  Weight: 140 lb (63.5 kg)     Physical Exam HENT:     Head: Normocephalic and atraumatic.     Mouth/Throat:     Pharynx: No oropharyngeal exudate.  Eyes:     Pupils: Pupils are equal, round, and reactive to light.  Cardiovascular:     Rate and Rhythm: Normal rate and regular rhythm.  Pulmonary:     Effort: Pulmonary effort is normal. No respiratory distress.     Breath sounds: Normal breath sounds. No wheezing.  Abdominal:     General: Bowel sounds are normal. There is no distension.     Palpations: Abdomen is soft. There is no mass.     Tenderness: There is no abdominal tenderness. There is no guarding or rebound.  Musculoskeletal:        General: No tenderness. Normal range of motion.     Cervical back: Normal range of motion and neck supple.  Skin:    General: Skin is warm.  Neurological:      Mental Status: She is alert and oriented to person, place, and time.  Psychiatric:        Mood and Affect: Affect normal.   I am going to provide you   LABORATORY DATA:  I have reviewed the data as listed Lab Results  Component Value Date   WBC 7.8 02/07/2024   HGB 15.9 (H) 02/07/2024   HCT 48.4 (H) 02/07/2024   MCV 95.5 02/07/2024   PLT 252 02/07/2024   Recent Labs    04/10/23 0336 04/10/23 0418 05/12/23 0918 08/09/23 0916 02/07/24 0856  NA  --    < > 137 138 138  K  --    < > 4.2 4.9 4.6  CL  --    < > 100 102 103  CO2  --    < > 27 27 24   GLUCOSE  --    < > 120* 105* 106*  BUN  --    < > 17 10 13   CREATININE  --    < > 0.79 0.77 0.81  CALCIUM  --    < > 8.7* 9.3 9.1  GFRNONAA  --    < > >60 >60 >60  PROT 6.9  --   --  7.5 6.9  ALBUMIN 3.3*  --   --  4.0 3.9  AST 15  --   --  16 15  ALT 10  --   --  12 11  ALKPHOS 55  --   --  63 59  BILITOT 1.3*  --   --  0.5 0.6  BILIDIR 0.4*  --   --   --   --   IBILI 0.9  --   --   --   --    < > =  values in this interval not displayed.    RADIOGRAPHIC STUDIES: I have personally reviewed the radiological images as listed and agreed with the findings in the report. No results found.   ASSESSMENT & PLAN:   Carcinoma of overlapping sites of left breast in female, estrogen receptor positive (HCC) #  stage I ER/PR positive HER-2 negative left breast cancer-invasive mammary carcinoma. S/p RT [finished June 1st week, 2022].  Currently on anastrozole; [Dr.Diaz] DEC 2024 BIL- mammo-WNL. Stable.   # Tolerating well- continue  anastrozole for total of 5 years [summer 2027].  # Post Lumpectomy/neuropathic pain- G-2-3- [previously on gabapentin for back].  No clinical concerns for recurrent disease.  Will try to take home dose 100 gabapentin-    # Osteoporosis : BMD T score -2.6 [July 2022]; OCT 2024-  T-score of -2.8 on calcium plus vitamin D-Discussed the potential risk factors for osteoporosis- age/gender/postmenopausal  status/use of anti-estrogen treatments. Discussed multiple options including exercise/ calcium and vitamin D supplementation/ and also use of bisphosphonates. Discussed oral bisphosphonates versus parenteral bisphosphonate [declines to Hx of GERD]. Also like Reclast  Discussed the potential benefits and/side effects  Including but not limited to Osteonecrosis of jaw/ hypocalcemia. Recommend dental clearance re: Zolendronic acid.   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  Monitor for now.  Do not suspect any primary causes.  stable.  # Smoking: Active smoker; counseled to quit smoking [< 1/2ppd-quitting]. On LDCT-March 2023 [Dr.Fleming]- stable.   # DISPOSITION:  # follow up in 6 months- MD;  labs-cbc/cmp; possible reclast --  Dr.B  All questions were answered. The patient/family knows to call the clinic with any problems, questions or concerns.    Earna Coder, MD 02/07/2024 9:56 AM

## 2024-02-07 NOTE — Progress Notes (Signed)
 Fatigued. Having some pain under left breast, she was told from scar tissue or nerve damage after surgery. Pt states it is getting worse and pain is moving towards her right breast. Taking her anastrazole, denies side effects.

## 2024-04-02 ENCOUNTER — Other Ambulatory Visit: Payer: Self-pay | Admitting: Internal Medicine

## 2024-05-02 DIAGNOSIS — Z170 Estrogen receptor positive status [ER+]: Secondary | ICD-10-CM | POA: Diagnosis not present

## 2024-05-02 DIAGNOSIS — R7303 Prediabetes: Secondary | ICD-10-CM | POA: Diagnosis not present

## 2024-05-02 DIAGNOSIS — Z853 Personal history of malignant neoplasm of breast: Secondary | ICD-10-CM | POA: Diagnosis not present

## 2024-05-02 DIAGNOSIS — F329 Major depressive disorder, single episode, unspecified: Secondary | ICD-10-CM | POA: Diagnosis not present

## 2024-05-02 DIAGNOSIS — F3341 Major depressive disorder, recurrent, in partial remission: Secondary | ICD-10-CM | POA: Diagnosis not present

## 2024-05-02 DIAGNOSIS — I878 Other specified disorders of veins: Secondary | ICD-10-CM | POA: Diagnosis not present

## 2024-05-02 DIAGNOSIS — D751 Secondary polycythemia: Secondary | ICD-10-CM | POA: Diagnosis not present

## 2024-05-02 DIAGNOSIS — R29898 Other symptoms and signs involving the musculoskeletal system: Secondary | ICD-10-CM | POA: Diagnosis not present

## 2024-05-02 DIAGNOSIS — J449 Chronic obstructive pulmonary disease, unspecified: Secondary | ICD-10-CM | POA: Diagnosis not present

## 2024-05-02 DIAGNOSIS — C50812 Malignant neoplasm of overlapping sites of left female breast: Secondary | ICD-10-CM | POA: Diagnosis not present

## 2024-05-02 DIAGNOSIS — R200 Anesthesia of skin: Secondary | ICD-10-CM | POA: Diagnosis not present

## 2024-05-02 DIAGNOSIS — I700 Atherosclerosis of aorta: Secondary | ICD-10-CM | POA: Diagnosis not present

## 2024-05-02 DIAGNOSIS — F17210 Nicotine dependence, cigarettes, uncomplicated: Secondary | ICD-10-CM | POA: Diagnosis not present

## 2024-05-02 DIAGNOSIS — J3089 Other allergic rhinitis: Secondary | ICD-10-CM | POA: Diagnosis not present

## 2024-05-02 DIAGNOSIS — G4709 Other insomnia: Secondary | ICD-10-CM | POA: Diagnosis not present

## 2024-05-02 DIAGNOSIS — R7309 Other abnormal glucose: Secondary | ICD-10-CM | POA: Diagnosis not present

## 2024-05-02 DIAGNOSIS — M810 Age-related osteoporosis without current pathological fracture: Secondary | ICD-10-CM | POA: Diagnosis not present

## 2024-05-02 DIAGNOSIS — Z720 Tobacco use: Secondary | ICD-10-CM | POA: Diagnosis not present

## 2024-05-02 DIAGNOSIS — E538 Deficiency of other specified B group vitamins: Secondary | ICD-10-CM | POA: Diagnosis not present

## 2024-05-24 ENCOUNTER — Encounter: Payer: Self-pay | Admitting: Internal Medicine

## 2024-06-05 ENCOUNTER — Ambulatory Visit: Admitting: Anesthesiology

## 2024-06-05 ENCOUNTER — Encounter: Payer: Self-pay | Admitting: Internal Medicine

## 2024-06-05 ENCOUNTER — Ambulatory Visit
Admission: RE | Admit: 2024-06-05 | Discharge: 2024-06-05 | Disposition: A | Discharge status: Home / Self Care | Payer: Medicare HMO | Attending: Internal Medicine | Admitting: Internal Medicine

## 2024-06-05 ENCOUNTER — Encounter
Admission: RE | Disposition: A | Discharge status: Home / Self Care | Payer: Self-pay | Source: Home / Self Care | Attending: Internal Medicine

## 2024-06-05 DIAGNOSIS — Z1211 Encounter for screening for malignant neoplasm of colon: Secondary | ICD-10-CM | POA: Insufficient documentation

## 2024-06-05 DIAGNOSIS — K5904 Chronic idiopathic constipation: Secondary | ICD-10-CM | POA: Insufficient documentation

## 2024-06-05 DIAGNOSIS — Z79899 Other long term (current) drug therapy: Secondary | ICD-10-CM | POA: Insufficient documentation

## 2024-06-05 DIAGNOSIS — Z09 Encounter for follow-up examination after completed treatment for conditions other than malignant neoplasm: Secondary | ICD-10-CM | POA: Diagnosis not present

## 2024-06-05 DIAGNOSIS — Z860101 Personal history of adenomatous and serrated colon polyps: Secondary | ICD-10-CM | POA: Insufficient documentation

## 2024-06-05 DIAGNOSIS — F17210 Nicotine dependence, cigarettes, uncomplicated: Secondary | ICD-10-CM | POA: Diagnosis not present

## 2024-06-05 DIAGNOSIS — F419 Anxiety disorder, unspecified: Secondary | ICD-10-CM | POA: Insufficient documentation

## 2024-06-05 DIAGNOSIS — J439 Emphysema, unspecified: Secondary | ICD-10-CM | POA: Insufficient documentation

## 2024-06-05 DIAGNOSIS — F17200 Nicotine dependence, unspecified, uncomplicated: Secondary | ICD-10-CM | POA: Diagnosis not present

## 2024-06-05 DIAGNOSIS — K219 Gastro-esophageal reflux disease without esophagitis: Secondary | ICD-10-CM | POA: Insufficient documentation

## 2024-06-05 DIAGNOSIS — F32A Depression, unspecified: Secondary | ICD-10-CM | POA: Diagnosis not present

## 2024-06-05 DIAGNOSIS — J449 Chronic obstructive pulmonary disease, unspecified: Secondary | ICD-10-CM | POA: Diagnosis not present

## 2024-06-05 DIAGNOSIS — K5730 Diverticulosis of large intestine without perforation or abscess without bleeding: Secondary | ICD-10-CM | POA: Diagnosis not present

## 2024-06-05 DIAGNOSIS — Z79811 Long term (current) use of aromatase inhibitors: Secondary | ICD-10-CM | POA: Diagnosis not present

## 2024-06-05 DIAGNOSIS — R519 Headache, unspecified: Secondary | ICD-10-CM | POA: Insufficient documentation

## 2024-06-05 HISTORY — DX: Spinal stenosis, lumbar region with neurogenic claudication: M48.062

## 2024-06-05 HISTORY — DX: Esophageal obstruction: K22.2

## 2024-06-05 HISTORY — DX: Other chronic pain: G89.29

## 2024-06-05 HISTORY — DX: Atherosclerosis of aorta: I70.0

## 2024-06-05 HISTORY — DX: Estrogen receptor positive status (ER+): C50.812

## 2024-06-05 HISTORY — PX: COLONOSCOPY: SHX5424

## 2024-06-05 HISTORY — DX: Malignant neoplasm of overlapping sites of left female breast: Z17.0

## 2024-06-05 SURGERY — COLONOSCOPY
Anesthesia: General

## 2024-06-05 MED ORDER — PROPOFOL 10 MG/ML IV BOLUS
INTRAVENOUS | Status: DC | PRN
  Administered 2024-06-05: 125 ug/kg/min via INTRAVENOUS
  Administered 2024-06-05: 80 mg via INTRAVENOUS

## 2024-06-05 MED ORDER — GLYCOPYRROLATE 0.2 MG/ML IJ SOLN
INTRAMUSCULAR | Status: DC | PRN
  Administered 2024-06-05: .2 mg via INTRAVENOUS

## 2024-06-05 MED ORDER — LIDOCAINE HCL (CARDIAC) PF 100 MG/5ML IV SOSY
PREFILLED_SYRINGE | INTRAVENOUS | Status: DC | PRN
  Administered 2024-06-05: 50 mg via INTRAVENOUS

## 2024-06-05 MED ORDER — SODIUM CHLORIDE 0.9 % IV SOLN: INTRAVENOUS | Status: DC

## 2024-06-05 MED ORDER — STERILE WATER FOR IRRIGATION IR SOLN
Status: DC | PRN
  Administered 2024-06-05: 60 mL

## 2024-06-05 NOTE — Interval H&P Note (Signed)
 History and Physical Interval Note:  06/05/2024 9:45 AM  Morgan Valdez  has presented today for surgery, with the diagnosis of Hx of adenomatous colonic polyps (Z86.0101).  The various methods of treatment have been discussed with the patient and family. After consideration of risks, benefits and other options for treatment, the patient has consented to  Procedure(s): COLONOSCOPY (N/A) as a surgical intervention.  The patient's history has been reviewed, patient examined, no change in status, stable for surgery.  I have reviewed the patient's chart and labs.  Questions were answered to the patient's satisfaction.     [place], Arren Laminack

## 2024-06-05 NOTE — Anesthesia Procedure Notes (Signed)
 Date/Time: 06/05/2024 10:34 AM  Performed by: Dominica Krabbe, CRNAPre-anesthesia Checklist: Patient identified, Emergency Drugs available, Suction available, Patient being monitored and Timeout performed Patient Re-evaluated:Patient Re-evaluated prior to induction Oxygen Delivery Method: Nasal cannula Preoxygenation: Pre-oxygenation with 100% oxygen Induction Type: IV induction

## 2024-06-05 NOTE — Anesthesia Postprocedure Evaluation (Signed)
 Anesthesia Post Note  Patient: Morgan Valdez  Procedure(s) Performed: COLONOSCOPY  Patient location during evaluation: PACU Anesthesia Type: General Level of consciousness: awake and alert Pain management: pain level controlled Vital Signs Assessment: post-procedure vital signs reviewed and stable Respiratory status: spontaneous breathing, nonlabored ventilation, respiratory function stable and patient connected to nasal cannula oxygen Cardiovascular status: blood pressure returned to baseline and stable Postop Assessment: no apparent nausea or vomiting Anesthetic complications: no   No notable events documented.   Last Vitals:  Vitals:   06/05/24 1100 06/05/24 1110  BP: 115/64 129/66  Pulse: 81 87  Resp: (!) 21 (!) 22  Temp:    SpO2: 100% 100%    Last Pain:  Vitals:   06/05/24 1110  TempSrc:   PainSc: 0-No pain                 [place] KANDICE Clause

## 2024-06-05 NOTE — H&P (Signed)
 Outpatient short stay form Pre-procedure 06/05/2024 9:42 AM Morgan Valdez Morgan Valdez, M.D.  Primary Physician: Morgan Valdez, M.D.  Reason for visit:  Esophageal dysmotility (Primary Dx);  Gastroesophageal reflux disease without esophagitis;  History of esophageal stricture;  Chronic idiopathic constipation;  Chronic nausea;  Hx of adenomatous colonic polyps;  Globus sensation   History of present illness:  GERD symptoms are well-controlled on Protonix  40 mg PO daily. Continue current therapy. Refills provided for 1-year to mail pharmacy.  - Strict anti-reflux precautions reviewed with patient - Tobacco cessation encouraged  - Liquid dysphagia symptoms likely 2/2 esophageal dysmotility and spasms - Discussed potential manometry +/- pH impedance testing at tertiary center and she declines again today - No indications for repeat EGD - Repeat surveillance colonoscopy     Current Facility-Administered Medications:    0.9 %  sodium chloride  infusion, , Intravenous, Continuous, Morgan Valdez K, MD  Medications Prior to Admission  Medication Sig Dispense Refill Last Dose/Taking   albuterol  (VENTOLIN  HFA) 108 (90 Base) MCG/ACT inhaler Inhale 1 puff into the lungs every 6 (six) hours as needed for wheezing or shortness of breath.   Taking As Needed   anastrozole  (ARIMIDEX ) 1 MG tablet TAKE 1 TABLET EVERY DAY 90 tablet 3 Past Week   cetirizine (ZYRTEC) 10 MG tablet Take 10 mg by mouth daily as needed for allergies.   Past Week   cyanocobalamin  (VITAMIN B12) 1000 MCG tablet Take by mouth.   Past Week   esomeprazole (NEXIUM) 40 MG capsule Take 40 mg by mouth 2 (two) times daily.   Past Week   gabapentin  (NEURONTIN ) 100 MG capsule Take 1 capsule (100 mg total) by mouth 2 (two) times daily as needed. 180 capsule 1 Past Week   ondansetron  (ZOFRAN ) 4 MG tablet Take 4 mg by mouth every 8 (eight) hours as needed for nausea/vomiting.  0 06/05/2024 Morning   buPROPion  (WELLBUTRIN  XL) 300 MG 24 hr tablet  Take by mouth. (Patient not taking: Reported on 02/07/2024)      SPIRIVA HANDIHALER 18 MCG inhalation capsule Place 1 capsule into inhaler and inhale in the morning.      topiramate (TOPAMAX) 25 MG tablet Take 25 mg by mouth daily as needed.      traZODone (DESYREL) 50 MG tablet Take 1 tablet by mouth at bedtime.      Vitamin D, Ergocalciferol, (DRISDOL) 1.25 MG (50000 UNIT) CAPS capsule Take 50,000 Units by mouth once a week.        Allergies  Allergen Reactions   Tape Dermatitis    If stayed on for a long time   Nicotine     Nicotine patch and causes blisters and red   Bacitracin Rash     Past Medical History:  Diagnosis Date   Allergic state    Anxiety    Aortic atherosclerosis (HCC)    Arthritis    Barrett's esophagus    Breast cancer (HCC)    Left   Carcinoma of overlapping sites of left breast in female, estrogen receptor positive (HCC)    Chronic cough    Chronic right-sided low back pain without sciatica    COPD (chronic obstructive pulmonary disease) (HCC)    Depression    Duodenitis    Emphysema of lung (HCC)    Esophageal motility disorder    Esophageal stenosis    Family history of adrenal cancer    Family history of brain cancer    Family history of colon cancer    Family history  of lung cancer    Family history of melanoma    Gastritis    GERD (gastroesophageal reflux disease)    Headache    migraines - 3-4x/yr   Osteoporosis, post-menopausal    Personal history of radiation therapy 2022   breast cancer   Pre-diabetes    Spinal stenosis of lumbar region with neurogenic claudication    Tobacco abuse disorder    Varicose veins of legs     Review of systems:  Otherwise negative.    Physical Exam  Gen: Alert, oriented. Appears stated age.  HEENT: [place]/AT. PERRLA. Lungs: CTA, no wheezes. CV: RR nl S1, S2. Abd: soft, benign, no masses. BS+ Ext: No edema. Pulses 2+    Planned procedures: Proceed with colonoscopy. The patient understands the  nature of the planned procedure, indications, risks, alternatives and potential complications including but not limited to bleeding, infection, perforation, damage to internal organs and possible oversedation/side effects from anesthesia. The patient agrees and gives consent to proceed.  Please refer to procedure notes for findings, recommendations and patient disposition/instructions.     Morgan Valdez Morgan Valdez, M.D. Gastroenterology 06/05/2024  9:42 AM

## 2024-06-05 NOTE — Transfer of Care (Signed)
 Immediate Anesthesia Transfer of Care Note  Patient: Morgan Valdez  Procedure(s) Performed: COLONOSCOPY  Patient Location: Endoscopy Unit  Anesthesia Type:General  Level of Consciousness: sedated and drowsy  Airway & Oxygen Therapy: Patient Spontanous Breathing  Post-op Assessment: Report given to RN and Post -op Vital signs reviewed and stable  Post vital signs: Reviewed and stable  Last Vitals:  Vitals Value Taken Time  BP 103/62 06/05/24 10:53  Temp    Pulse 82 06/05/24 10:53  Resp 23 06/05/24 10:53  SpO2 99 % 06/05/24 10:53  Vitals shown include unfiled device data.  Last Pain:  Vitals:   06/05/24 0940  TempSrc: Temporal         Complications: No notable events documented.

## 2024-06-05 NOTE — Anesthesia Preprocedure Evaluation (Signed)
 Anesthesia Evaluation  Patient identified by MRN, date of birth, ID band Patient awake    Reviewed: Allergy & Precautions, H&P , NPO status , Patient's Chart, lab work & pertinent test results, reviewed documented beta blocker date and time   Airway Mallampati: II   Neck ROM: full    Dental  (+) Poor Dentition   Pulmonary COPD,  COPD inhaler, Current Smoker and Patient abstained from smoking.   Pulmonary exam normal        Cardiovascular Exercise Tolerance: Poor negative cardio ROS Normal cardiovascular exam Rhythm:regular Rate:Normal     Neuro/Psych  Headaches PSYCHIATRIC DISORDERS Anxiety Depression       GI/Hepatic Neg liver ROS,GERD  Medicated,,  Endo/Other  negative endocrine ROS    Renal/GU negative Renal ROS  negative genitourinary   Musculoskeletal   Abdominal   Peds  Hematology negative hematology ROS (+)   Anesthesia Other Findings Past Medical History: No date: Allergic state No date: Anxiety No date: Aortic atherosclerosis (HCC) No date: Arthritis No date: Barrett's esophagus No date: Breast cancer (HCC)     Comment:  Left No date: Carcinoma of overlapping sites of left breast in female,  estrogen receptor positive (HCC) No date: Chronic cough No date: Chronic right-sided low back pain without sciatica No date: COPD (chronic obstructive pulmonary disease) (HCC) No date: Depression No date: Duodenitis No date: Emphysema of lung (HCC) No date: Esophageal motility disorder No date: Esophageal stenosis No date: Family history of adrenal cancer No date: Family history of brain cancer No date: Family history of colon cancer No date: Family history of lung cancer No date: Family history of melanoma No date: Gastritis No date: GERD (gastroesophageal reflux disease) No date: Headache     Comment:  migraines - 3-4x/yr No date: Osteoporosis, post-menopausal 2022: Personal history of radiation  therapy     Comment:  breast cancer No date: Pre-diabetes No date: Spinal stenosis of lumbar region with neurogenic claudication No date: Tobacco abuse disorder No date: Varicose veins of legs Past Surgical History: No date: APPENDECTOMY 01/02/2021: BREAST BIOPSY; Left     Comment:  [facility] WITH FEATURES SUGGESTIVE OF SOLID PSEUDOPAPILLARY               CARCINOMA 12/2020: BREAST LUMPECTOMY; Left     Comment:  SOLID PAPILLARY CARCINOMA, 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 01/28/2021: BREAST LUMPECTOMY,RADIO FREQ LOCALIZER,AXILLARY SENTINEL  LYMPH NODE BIOPSY; Left     Comment:  Procedure: BREAST LUMPECTOMY,RADIO FREQ               LOCALIZER,AXILLARY SENTINEL LYMPH NODE BIOPSY;  Surgeon:               Rodolph Romano, MD;  Location: [facility] ORS;  Service:              General;  Laterality: Left; 01/03/2017: CATARACT EXTRACTION W/PHACO; Right     Comment:  Procedure: CATARACT EXTRACTION PHACO AND INTRAOCULAR               LENS PLACEMENT (IOC)  Right;  Surgeon: Donzell Arlyce Budd, MD;  Location: [facility];  Service:              Ophthalmology;  Laterality: Right; 01/17/2017: CATARACT EXTRACTION W/PHACO; Left     Comment:  Procedure: CATARACT EXTRACTION PHACO AND INTRAOCULAR               LENS PLACEMENT (IOC);  Surgeon: Donzell  Arlyce Budd,              MD;  Location: [facility];  Service:               Ophthalmology;  Laterality: Left;  left No date: CHOLECYSTECTOMY 04/06/2018: COLONOSCOPY WITH PROPOFOL ; N/A     Comment:  Procedure: COLONOSCOPY WITH PROPOFOL ;  Surgeon:               Gaylyn Gladis PENNER, MD;  Location: [facility] END[facility];                Service: Endoscopy;  Laterality: N/A; 06/08/2021: COLONOSCOPY WITH PROPOFOL ; N/A     Comment:  Procedure: COLONOSCOPY WITH PROPOFOL ;  Surgeon: [place],               Ladell POUR, MD;  Location: [facility] ENDOSCOPY;  Service:               Gastroenterology;  Laterality: N/A; 09/26/2018: ESOPHAGOGASTRODUODENOSCOPY;  N/A     Comment:  Procedure: ESOPHAGOGASTRODUODENOSCOPY (EGD);  Surgeon:               Gaylyn Gladis PENNER, MD;  Location: [facility] ENDOSCOPY;                Service: Endoscopy;  Laterality: N/A; 10/14/2015: ESOPHAGOGASTRODUODENOSCOPY (EGD) WITH PROPOFOL ; N/A     Comment:  Procedure: ESOPHAGOGASTRODUODENOSCOPY (EGD) WITH               PROPOFOL ;  Surgeon: Gladis PENNER Gaylyn, MD;  Location:               [facility] END[facility]COPY;  Service: Endoscopy;  Laterality: N/A; 2019: EYE SURGERY; Bilateral     Comment:  cataracts  No date: FRACTURE SURGERY No date: HERNIA REPAIR BMI    Body Mass Index: 21.14 kg/m     Reproductive/Obstetrics negative OB ROS                              Anesthesia Physical Anesthesia Plan  ASA: 3  Anesthesia Plan: General   Post-op Pain Management:    Induction:   PONV Risk Score and Plan:   Airway Management Planned:   Additional Equipment:   Intra-op Plan:   Post-operative Plan:   Informed Consent: I have reviewed the patients History and Physical, chart, labs and discussed the procedure including the risks, benefits and alternatives for the proposed anesthesia with the patient or authorized representative who has indicated his/her understanding and acceptance.     Dental Advisory Given  Plan Discussed with: CRNA  Anesthesia Plan Comments:         Anesthesia Quick Evaluation

## 2024-06-05 NOTE — Op Note (Signed)
 [facility] Patient Name: Morgan Valdez Procedure Date: 06/05/2024 10:22 AM MRN: [id number] Account #: [id number] Date of Birth: 09-Oct-1946 Admit Type: Outpatient Age: 78 Room: [facility] ENDO ROOM 3 Gender: Female Note Status: Finalized Instrument Name: Arvis [id number] Procedure:             Colonoscopy Indications:           High risk colon cancer surveillance: Personal history                         of non-advanced adenoma Providers:             Laveta Gilkey K. Aundria MD, MD Referring MD:          Tamra Leventhal, MD (Referring MD) Medicines:             Propofol  per Anesthesia Complications:         No immediate complications. Estimated blood loss: None. Procedure:             Pre-Anesthesia Assessment:                        - The risks and benefits of the procedure and the                         sedation options and risks were discussed with the                         patient. All questions were answered and informed                         consent was obtained.                        - Patient identification and proposed procedure were                         verified prior to the procedure by the nurse. The                         procedure was verified in the procedure room.                        - ASA Grade Assessment: III - A patient with severe                         systemic disease.                        - After reviewing the risks and benefits, the patient                         was deemed in satisfactory condition to undergo the                         procedure.                        After obtaining informed consent, the colonoscope was  passed under direct vision. Throughout the procedure,                         the patient's blood pressure, pulse, and oxygen                         saturations were monitored continuously. The                         Colonoscope was introduced through the anus and                          advanced to the the cecum, identified by appendiceal                         orifice and ileocecal valve. The colonoscopy was                         performed without difficulty. The patient tolerated                         the procedure well. The quality of the bowel                         preparation was adequate. The ileocecal valve,                         appendiceal orifice, and rectum were photographed. Findings:      The perianal and digital rectal examinations were normal. Pertinent       negatives include normal sphincter tone and no palpable rectal lesions.      Multiple large-mouthed and small-mouthed diverticula were found in the       sigmoid colon. There was no evidence of diverticular bleeding.      The exam was otherwise without abnormality on direct and retroflexion       views. Impression:            - Moderate diverticulosis in the sigmoid colon. There                         was no evidence of diverticular bleeding.                        - The examination was otherwise normal on direct and                         retroflexion views.                        - No specimens collected. Recommendation:        - Patient has a contact number available for                         emergencies. The signs and symptoms of potential                         delayed complications were discussed with the patient.  Return to normal activities tomorrow. Written                         discharge instructions were provided to the patient.                        - Resume previous diet.                        - Continue present medications.                        - Repeat colonoscopy in 5 years for screening purposes. Procedure Code(s):     --- Professional ---                        H9894, Colorectal cancer screening; colonoscopy on                         individual at high risk Diagnosis Code(s):     --- Professional ---                         K57.30, Diverticulosis of large intestine without                         perforation or abscess without bleeding                        Z86.010, Personal history of colonic polyps CPT copyright 2022 [employer]. All rights reserved. The codes documented in this report are preliminary and upon coder review may  be revised to meet current compliance requirements. Morgan MARLA Boss MD, MD 06/05/2024 10:51:46 AM This report has been signed electronically. Number of Addenda: 0 Note Initiated On: 06/05/2024 10:22 AM Scope Withdrawal Time: 0 hours 5 minutes 31 seconds  Total Procedure Duration: 0 hours 9 minutes 48 seconds  Estimated Blood Loss:  Estimated blood loss: none.      [facility]

## 2024-06-12 DIAGNOSIS — E042 Nontoxic multinodular goiter: Secondary | ICD-10-CM | POA: Diagnosis not present

## 2024-06-12 DIAGNOSIS — R7989 Other specified abnormal findings of blood chemistry: Secondary | ICD-10-CM | POA: Diagnosis not present

## 2024-06-12 DIAGNOSIS — Z720 Tobacco use: Secondary | ICD-10-CM | POA: Diagnosis not present

## 2024-06-12 DIAGNOSIS — M810 Age-related osteoporosis without current pathological fracture: Secondary | ICD-10-CM | POA: Diagnosis not present

## 2024-06-20 DIAGNOSIS — M810 Age-related osteoporosis without current pathological fracture: Secondary | ICD-10-CM | POA: Diagnosis not present

## 2024-07-04 ENCOUNTER — Encounter: Payer: Self-pay | Admitting: Internal Medicine

## 2024-08-07 ENCOUNTER — Ambulatory Visit: Admitting: Internal Medicine

## 2024-08-07 ENCOUNTER — Ambulatory Visit

## 2024-08-07 ENCOUNTER — Other Ambulatory Visit

## 2024-08-08 ENCOUNTER — Inpatient Hospital Stay: Attending: Internal Medicine

## 2024-08-08 ENCOUNTER — Inpatient Hospital Stay: Admitting: Internal Medicine

## 2024-08-08 ENCOUNTER — Inpatient Hospital Stay

## 2024-08-08 ENCOUNTER — Ambulatory Visit

## 2024-08-08 ENCOUNTER — Other Ambulatory Visit

## 2024-08-08 ENCOUNTER — Other Ambulatory Visit: Payer: Self-pay | Admitting: *Deleted

## 2024-08-08 ENCOUNTER — Encounter: Payer: Self-pay | Admitting: Internal Medicine

## 2024-08-08 ENCOUNTER — Ambulatory Visit: Admitting: Internal Medicine

## 2024-08-08 VITALS — BP 140/81 | HR 81 | Temp 99.0°F | Resp 16 | Ht 66.0 in | Wt 138.6 lb

## 2024-08-08 DIAGNOSIS — Z808 Family history of malignant neoplasm of other organs or systems: Secondary | ICD-10-CM | POA: Diagnosis not present

## 2024-08-08 DIAGNOSIS — Z801 Family history of malignant neoplasm of trachea, bronchus and lung: Secondary | ICD-10-CM | POA: Insufficient documentation

## 2024-08-08 DIAGNOSIS — Z1721 Progesterone receptor positive status: Secondary | ICD-10-CM | POA: Diagnosis not present

## 2024-08-08 DIAGNOSIS — Z170 Estrogen receptor positive status [ER+]: Secondary | ICD-10-CM

## 2024-08-08 DIAGNOSIS — D751 Secondary polycythemia: Secondary | ICD-10-CM | POA: Diagnosis not present

## 2024-08-08 DIAGNOSIS — Z79899 Other long term (current) drug therapy: Secondary | ICD-10-CM | POA: Insufficient documentation

## 2024-08-08 DIAGNOSIS — Z800 Family history of malignant neoplasm of digestive organs: Secondary | ICD-10-CM | POA: Insufficient documentation

## 2024-08-08 DIAGNOSIS — Z1732 Human epidermal growth factor receptor 2 negative status: Secondary | ICD-10-CM | POA: Insufficient documentation

## 2024-08-08 DIAGNOSIS — C50812 Malignant neoplasm of overlapping sites of left female breast: Secondary | ICD-10-CM

## 2024-08-08 DIAGNOSIS — M810 Age-related osteoporosis without current pathological fracture: Secondary | ICD-10-CM | POA: Insufficient documentation

## 2024-08-08 DIAGNOSIS — Z79811 Long term (current) use of aromatase inhibitors: Secondary | ICD-10-CM | POA: Diagnosis not present

## 2024-08-08 LAB — CMP (CANCER CENTER ONLY)
ALT: 8 U/L (ref 0–44)
AST: 13 U/L — ABNORMAL LOW (ref 15–41)
Albumin: 3.8 g/dL (ref 3.5–5.0)
Alkaline Phosphatase: 47 U/L (ref 38–126)
Anion gap: 7 (ref 5–15)
BUN: 12 mg/dL (ref 8–23)
CO2: 25 mmol/L (ref 22–32)
Calcium: 8.7 mg/dL — ABNORMAL LOW (ref 8.9–10.3)
Chloride: 105 mmol/L (ref 98–111)
Creatinine: 0.85 mg/dL (ref 0.44–1.00)
GFR, Estimated: >60 mL/min (ref >60)
Glucose, Bld: 101 mg/dL — ABNORMAL HIGH (ref 70–99)
Potassium: 4.1 mmol/L (ref 3.5–5.1)
Sodium: 137 mmol/L (ref 135–145)
Total Bilirubin: 0.6 mg/dL (ref 0.0–1.2)
Total Protein: 6.4 g/dL — ABNORMAL LOW (ref 6.5–8.1)

## 2024-08-08 LAB — CBC WITH DIFFERENTIAL (CANCER CENTER ONLY)
Abs Immature Granulocytes: 0.03 K/uL (ref 0.00–0.07)
Basophils Absolute: 0 K/uL (ref 0.0–0.1)
Basophils Relative: 0 %
Eosinophils Absolute: 0.1 K/uL (ref 0.0–0.5)
Eosinophils Relative: 1 %
HCT: 43.1 % (ref 36.0–46.0)
Hemoglobin: 14.4 g/dL (ref 12.0–15.0)
Immature Granulocytes: 0 %
Lymphocytes Relative: 25 %
Lymphs Abs: 1.9 K/uL (ref 0.7–4.0)
MCH: 31.5 pg (ref 26.0–34.0)
MCHC: 33.4 g/dL (ref 30.0–36.0)
MCV: 94.3 fL (ref 80.0–100.0)
Monocytes Absolute: 0.6 K/uL (ref 0.1–1.0)
Monocytes Relative: 8 %
Neutro Abs: 5.1 K/uL (ref 1.7–7.7)
Neutrophils Relative %: 66 %
Platelet Count: 227 K/uL (ref 150–400)
RBC: 4.57 MIL/uL (ref 3.87–5.11)
RDW: 13.5 % (ref 11.5–15.5)
WBC Count: 7.7 K/uL (ref 4.0–10.5)
nRBC: 0 % (ref 0.0–0.2)

## 2024-08-08 NOTE — Progress Notes (Signed)
 [facility] CONSULT NOTE  Patient Care Team: Sadie Manna, MD as PCP - General (Internal Medicine) Dannielle Arlean FALCON, RN (Inactive) as Oncology Nurse Navigator Rennie Cindy SAUNDERS, MD as Consulting Physician (Internal Medicine) Lenn Aran, MD as Referring Physician (Radiation Oncology) Rodolph Romano, MD as Consulting Physician (General Surgery)  CHIEF COMPLAINTS/PURPOSE OF CONSULTATION: Breast cancer  #  Oncology History Overview Note  #  BREAST, LEFT 2:00; ULTRASOUND-GUIDED BIOPSY:  - INVASIVE MAMMARY CARCINOMA WITH FEATURES SUGGESTIVE OF SOLID  PSEUDOPAPILLARY CARCINOMA; ER >90%; PR > 90%; he 2 Neu-NEG [Dr.Cinton]  PATHOLOGIC STAGE CLASSIFICATION (pTNM, AJCC 8th Edition):  TNM Descriptors: Not applicable  pT1a  Regional Lymph Nodes Modifier: sn  pN0  pM - Not applicable   SPECIAL STUDIES  Breast Biomarker Testing Performed on Previous Biopsy: [id number]  Estrogen Receptor (ER) Status: POSITIVE          Percentage of cells with nuclear positivity: Greater than 90%          Average intensity of staining: Strong   Progesterone Receptor (PgR) Status: POSITIVE          Percentage of cells with nuclear positivity: Greater than 90%          Average intensity of staining: Moderate   HER2 (by immunohistochemistry): NEGATIVE (Score 1+)  #Cancer screening : colonoscopy [since 56 years; again aug 2022];smoker;        Carcinoma of overlapping sites of left breast in female, estrogen receptor positive (HCC)  01/19/2021 Initial Diagnosis   Carcinoma of overlapping sites of left breast in female, estrogen receptor positive (HCC)   01/19/2021 Cancer Staging   Staging form: Breast, AJCC 8th Edition - Clinical: Stage IA (cT1a, cN0, cM0, G2, ER+, PR+, HER2-) - Signed by Rennie Cindy SAUNDERS, MD on 01/19/2021 Histologic grading system: 3 grade system    Genetic Testing   Negative genetic testing. No pathogenic variants identified on the [facility]  Multi-Cancer Panel+RNA. The report date is 03/18/2021.  The Multi-Cancer Panel + RNA offered by [facility] includes sequencing and/or deletion duplication testing of the following 84 genes: AIP, ALK, APC, ATM, AXIN2,BAP1,  BARD1, BLM, BMPR1A, BRCA1, BRCA2, BRIP1, CASR, CDC73, CDH1, CDK4, CDKN1B, CDKN1C, CDKN2A (p14ARF), CDKN2A (p16INK4a), CEBPA, CHEK2, CTNNA1, DICER1, DIS3L2, EGFR (c.2369C>T, p.Thr790Met variant only), EPCAM (Deletion/duplication testing only), FH, FLCN, GATA2, GPC3, GREM1 (Promoter region deletion/duplication testing only), HOXB13 (c.251G>A, p.Gly84Glu), HRAS, KIT, MAX, MEN1, MET, MITF (c.952G>A, p.Glu318Lys variant only), MLH1, MSH2, MSH3, MSH6, MUTYH, NBN, NF1, NF2, NTHL1, PALB2, PDGFRA, PHOX2B, PMS2, POLD1, POLE, POT1, PRKAR1A, PTCH1, PTEN, RAD50, RAD51C, RAD51D, RB1, RECQL4, RET, RUNX1, SDHAF2, SDHA (sequence changes only), SDHB, SDHC, SDHD, SMAD4, SMARCA4, SMARCB1, SMARCE1, STK11, SUFU, TERC, TERT, TMEM127, TP53, TSC1, TSC2, VHL, WRN and WT1.    HISTORY OF PRESENTING ILLNESS: Ambulating independently.  Alone.  Morgan Valdez 78 y.o.  female stage I LEFT breast cancer ER/PR positive HER2 negative-anastrozole  is  is here for follow-up.  She has been experiencing leg weakness and numbness for the past couple of months. The sensation is described as a lack of feeling rather than numbness, sometimes starting with tingling. This issue has been persistent and was associated with a sprained ankle earlier in the summer when her foot 'went to sleep,' causing her to fall. She was evaluated at an orthopedic clinic and was told it was a bad sprain. However, she still experiences occasional discomfort and favors the ankle, which she believes contributes to hip and back pain.  Recently, she has also noticed numbness in her  hands. No history of diabetes, which was discussed in relation to neuropathy. She received a B12 shot and was started on B12 tablets by a previous provider to help with the  numbness, but she has not noticed any improvement.  She takes several medications, including a cancer drug, a stool softener, and ondansetron  for nausea, which she experiences more in the late afternoon now rather than in the morning.  She reports persistent swelling in her feet, which worsens with salt intake. She tries to manage this by avoiding salty foods and elevating her feet when possible. She recalls a previous test involving blood pressure cuffs on her ankles and arms, which indicated normal pressure in her legs.  Taking her anastrazole, denies side effect   Review of Systems  Constitutional:  Negative for chills, diaphoresis, fever, malaise/fatigue and weight loss.  HENT:  Negative for nosebleeds and sore throat.   Eyes:  Negative for double vision.  Respiratory:  Negative for cough, hemoptysis, sputum production, shortness of breath and wheezing.   Cardiovascular:  Negative for chest pain, palpitations, orthopnea and leg swelling.  Gastrointestinal:  Negative for abdominal pain, blood in stool, constipation, diarrhea, heartburn, melena, nausea and vomiting.  Genitourinary:  Negative for dysuria, frequency and urgency.  Musculoskeletal:  Positive for back pain. Negative for joint pain.  Skin:  Negative for itching.  Neurological:  Negative for dizziness, tingling, focal weakness, weakness and headaches.  Endo/Heme/Allergies:  Does not bruise/bleed easily.  Psychiatric/Behavioral:  Negative for depression. The patient is not nervous/anxious and does not have insomnia.      MEDICAL HISTORY:  Past Medical History:  Diagnosis Date   Allergic state    Anxiety    Aortic atherosclerosis    Arthritis    Barrett's esophagus    Breast cancer (HCC)    Left   Carcinoma of overlapping sites of left breast in female, estrogen receptor positive (HCC)    Chronic cough    Chronic right-sided low back pain without sciatica    COPD (chronic obstructive pulmonary disease) (HCC)    Depression     Duodenitis    Emphysema of lung (HCC)    Esophageal motility disorder    Esophageal stenosis    Family history of adrenal cancer    Family history of brain cancer    Family history of colon cancer    Family history of lung cancer    Family history of melanoma    Gastritis    GERD (gastroesophageal reflux disease)    Headache    migraines - 3-4x/yr   Osteoporosis, post-menopausal    Personal history of radiation therapy 2022   breast cancer   Pre-diabetes    Spinal stenosis of lumbar region with neurogenic claudication    Tobacco abuse disorder    Varicose veins of legs     SURGICAL HISTORY: Past Surgical History:  Procedure Laterality Date   APPENDECTOMY     BREAST BIOPSY Left 01/02/2021   [facility] WITH FEATURES SUGGESTIVE OF SOLID PSEUDOPAPILLARY CARCINOMA   BREAST LUMPECTOMY Left 12/2020   SOLID PAPILLARY CARCINOMA, 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   BREAST LUMPECTOMY,RADIO FREQ LOCALIZER,AXILLARY SENTINEL LYMPH NODE BIOPSY Left 01/28/2021   Procedure: BREAST LUMPECTOMY,RADIO FREQ LOCALIZER,AXILLARY SENTINEL LYMPH NODE BIOPSY;  Surgeon: Rodolph Romano, MD;  Location: [facility];  Service: General;  Laterality: Left;   CATARACT EXTRACTION W/PHACO Right 01/03/2017   Procedure: CATARACT EXTRACTION PHACO AND INTRAOCULAR LENS PLACEMENT (IOC)  Right;  Surgeon: Donzell Arlyce Budd, MD;  Location: [facility];  Service: Ophthalmology;  Laterality: Right;   CATARACT EXTRACTION W/PHACO Left 01/17/2017   Procedure: CATARACT EXTRACTION PHACO AND INTRAOCULAR LENS PLACEMENT (IOC);  Surgeon: Donzell Arlyce Budd, MD;  Location: [facility];  Service: Ophthalmology;  Laterality: Left;  left   CHOLECYSTECTOMY     COLONOSCOPY N/A 06/05/2024   Procedure: COLONOSCOPY;  Surgeon: Toledo, Ladell POUR, MD;  Location: [facility];  Service: Gastroenterology;  Laterality: N/A;   COLONOSCOPY WITH PROPOFOL  N/A 04/06/2018   Procedure: COLONOSCOPY WITH PROPOFOL ;  Surgeon: Gaylyn Gladis PENNER, MD;  Location: [facility] END[facility];  Service: Endoscopy;  Laterality: N/A;   COLONOSCOPY WITH PROPOFOL  N/A 06/08/2021   Procedure: COLONOSCOPY WITH PROPOFOL ;  Surgeon: Toledo, Ladell POUR, MD;  Location: [facility] ENDOSCOPY;  Service: Gastroenterology;  Laterality: N/A;   ESOPHAGOGASTRODUODENOSCOPY N/A 09/26/2018   Procedure: ESOPHAGOGASTRODUODENOSCOPY (EGD);  Surgeon: Gaylyn Gladis PENNER, MD;  Location: [facility] END[facility]COPY;  Service: Endoscopy;  Laterality: N/A;   ESOPHAGOGASTRODUODENOSCOPY (EGD) WITH PROPOFOL  N/A 10/14/2015   Procedure: ESOPHAGOGASTRODUODENOSCOPY (EGD) WITH PROPOFOL ;  Surgeon: Gladis PENNER Gaylyn, MD;  Location: [facility];  Service: Endoscopy;  Laterality: N/A;   EYE SURGERY Bilateral 2019   cataracts    FRACTURE SURGERY     HERNIA REPAIR      SOCIAL HISTORY: Social History   Socioeconomic History   Marital status: Widowed    Spouse name: Not on file   Number of children: Not on file   Years of education: Not on file   Highest education level: Not on file  Occupational History   Not on file  Tobacco Use   Smoking status: Every Day    Current packs/day: 0.50    Average packs/day: 0.5 packs/day for 50.0 years (25.0 ttl pk-yrs)    Types: Cigarettes   Smokeless tobacco: Never  Vaping Use   Vaping status: Never Used  Substance and Sexual Activity   Alcohol  use: Not Currently    Alcohol /week: 1.0 standard drink of alcohol     Types: 1 Glasses of wine per week    Comment: rare - Holidays   Drug use: No   Sexual activity: Not on file  Other Topics Concern   Not on file  Social History Narrative   Lives in [place]; 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   Overall Financial Resource Strain (CARDIA)    Difficulty of Paying Living Expenses: Not hard at all  Food Insecurity: No Food Insecurity (06/12/2024)    Received from [facility]   Hunger Vital Sign    Within the past 12 months, you worried that your food would run out before you got the money to buy more.: Never true    Within the past 12 months, the food you bought just didn't last and you didn't have money to get more.: Never true  Transportation Needs: No Transportation Needs (06/12/2024)   Received from [facility] - Transportation    In the past 12 months, has lack of transportation kept you from medical appointments or from getting medications?: No    Lack of Transportation (Non-Medical): No  Physical Activity: Not on file  Stress: Not on file  Social Connections: Not on file  Intimate Partner Violence: Not on file    FAMILY HISTORY: Family History  Problem Relation Age of Onset   Cancer Mother  esophagus, spine. adrenal gland; lung.    Dementia Father    Cancer Sister        colon cancer- 40s to liver.    Cancer Maternal Grandmother        brain cancer   Diabetes Maternal Grandfather    Stroke Maternal Grandfather    Rectal cancer Daughter        at 21 years; survived.    Melanoma Grandson        dx 22 on neck   Breast cancer Neg Hx     ALLERGIES:  is allergic to tape, nicotine, and bacitracin.  MEDICATIONS:  Current Outpatient Medications  Medication Sig Dispense Refill   albuterol  (VENTOLIN  HFA) 108 (90 Base) MCG/ACT inhaler Inhale 1 puff into the lungs every 6 (six) hours as needed for wheezing or shortness of breath.     anastrozole  (ARIMIDEX ) 1 MG tablet TAKE 1 TABLET EVERY DAY 90 tablet 3   cetirizine (ZYRTEC) 10 MG tablet Take 10 mg by mouth daily as needed for allergies.     cyanocobalamin  (VITAMIN B12) 1000 MCG tablet Take by mouth.     esomeprazole (NEXIUM) 40 MG capsule Take 40 mg by mouth 2 (two) times daily. (Patient taking differently: Take 40 mg by mouth daily at 12 noon.)     gabapentin  (NEURONTIN ) 100 MG capsule Take 1 capsule (100 mg total) by  mouth 2 (two) times daily as needed. 180 capsule 1   ondansetron  (ZOFRAN ) 4 MG tablet Take 4 mg by mouth every 8 (eight) hours as needed for nausea/vomiting.  0   SPIRIVA HANDIHALER 18 MCG inhalation capsule Place 1 capsule into inhaler and inhale in the morning.     traZODone (DESYREL) 50 MG tablet Take 1 tablet by mouth at bedtime.     topiramate (TOPAMAX) 25 MG tablet Take 25 mg by mouth daily as needed.     No current facility-administered medications for this visit.      SABRA  PHYSICAL EXAMINATION: ECOG PERFORMANCE STATUS: 0 - Asymptomatic  Vitals:   08/08/24 1023 08/08/24 1121  BP: (!) 149/65 (!) 140/81  Pulse: 81   Resp: 16   Temp: 99 F (37.2 C)   SpO2: 98%     Filed Weights   08/08/24 1023  Weight: 138 lb 9.6 oz (62.9 kg)     Physical Exam HENT:     Head: Normocephalic and atraumatic.     Mouth/Throat:     Pharynx: No oropharyngeal exudate.  Eyes:     Pupils: Pupils are equal, round, and reactive to light.  Cardiovascular:     Rate and Rhythm: Normal rate and regular rhythm.  Pulmonary:     Effort: Pulmonary effort is normal. No respiratory distress.     Breath sounds: Normal breath sounds. No wheezing.  Abdominal:     General: Bowel sounds are normal. There is no distension.     Palpations: Abdomen is soft. There is no mass.     Tenderness: There is no abdominal tenderness. There is no guarding or rebound.  Musculoskeletal:        General: No tenderness. Normal range of motion.     Cervical back: Normal range of motion and neck supple.  Skin:    General: Skin is warm.  Neurological:     Mental Status: She is alert and oriented to person, place, and time.  Psychiatric:        Mood and Affect: Affect normal.   I am going to provide you  LABORATORY DATA:  I have reviewed the data as listed Lab Results  Component Value Date   WBC 7.7 08/08/2024   HGB 14.4 08/08/2024   HCT 43.1 08/08/2024   MCV 94.3 08/08/2024   PLT 227 08/08/2024   Recent Labs     02/07/24 0856 08/08/24 1020  NA 138 137  K 4.6 4.1  CL 103 105  CO2 24 25  GLUCOSE 106* 101*  BUN 13 12  CREATININE 0.81 0.85  CALCIUM 9.1 8.7*  GFRNONAA >60 >60  PROT 6.9 6.4*  ALBUMIN 3.9 3.8  AST 15 13*  ALT 11 8  ALKPHOS 59 47  BILITOT 0.6 0.6    RADIOGRAPHIC STUDIES: I have personally reviewed the radiological images as listed and agreed with the findings in the report. No results found.   ASSESSMENT & PLAN:   Carcinoma of overlapping sites of left breast in female, estrogen receptor positive (HCC) #  stage I ER/PR positive HER-2 negative left breast cancer-invasive mammary carcinoma. S/p RT [finished June 1st week, 2022].  Currently on anastrozole ; [Dr.Diaz] DEC 2024 BIL- mammo-WNL. Stable.   # Tolerating well- continue  anastrozole  for total of 5 years [summer 2027].  Clinically no evidence of recurrence.  Will order mammogram for 2025.  # Post Lumpectomy/neuropathic pain- G-2-3- [previously on gabapentin  for back].  No clinical concerns for recurrent disease.  Will try to take home dose 100 gabapentin -    # Osteoporosis : BMD T score -2.6 [July 2022]; OCT 2024-  T-score of -2.8 on calcium plus vitamin D- on reclast [PCP].   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  Monitor for now.  Do not suspect any primary causes.  stable.  # Question bilateral peripheral neuropathy defer to PCP for further evaluation.  # Bilateral lower extremity swelling-possibly dependent edema.  Recommend stockings.  Leg elevation-defer to PCP for further recommendations/workup  # Smoking: Active smoker; counseled to quit smoking [< 1/2ppd-quitting]. CTA 2024 [Dr.Fleming]- stable.   # DISPOSITION:  # NO RECLAST  # dec 2025- bil mammogram  # follow up in 6 months- MD;  labs-cbc/cmp- vit D 25-OH- -  Dr.B   All questions were answered. The patient/family knows to call the clinic with any problems, questions or concerns.    Cindy JONELLE Joe, MD 08/08/2024 1:37  PM

## 2024-08-08 NOTE — Assessment & Plan Note (Addendum)
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 S/p RT [finished June 1st week, 2022].  Currently on anastrozole ; [Dr.Diaz] DEC 2024 BIL- mammo-WNL. Stable.   # Tolerating well- continue  anastrozole  for total of 5 years [summer 2027].  Clinically no evidence of recurrence.  Will order mammogram for 2025.  # Post Lumpectomy/neuropathic pain- G-2-3- [previously on gabapentin  for back].  No clinical concerns for recurrent disease.  Will try to take home dose 100 gabapentin -    # Osteoporosis : BMD T score -2.6 [July 2022]; OCT 2024-  T-score of -2.8 on calcium plus vitamin D- on reclast [PCP].   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  Monitor for now.  Do not suspect any primary causes.  stable.  # Question bilateral peripheral neuropathy defer to PCP for further evaluation.  # Bilateral lower extremity swelling-possibly dependent edema.  Recommend stockings.  Leg elevation-defer to PCP for further recommendations/workup  # Smoking: Active smoker; counseled to quit smoking [< 1/2ppd-quitting]. CTA 2024 [Dr.Fleming]- stable.   # DISPOSITION:  # NO RECLAST  # dec 2025- bil mammogram  # follow up in 6 months- MD;  labs-cbc/cmp- vit D 25-OH- -  Dr.B

## 2024-08-08 NOTE — Progress Notes (Signed)
 C/o headaches for the past month, pain 7/10 and swelling in feet for months.   C/o legs being dead, no feeling when she tries to get up.  She states she had her reclast 3 weeks ago at Endo with Cassandra Hernandex PA.  Colonoscopy 8/25.

## 2024-08-10 ENCOUNTER — Other Ambulatory Visit: Payer: Self-pay | Admitting: Internal Medicine

## 2024-08-10 DIAGNOSIS — C50812 Malignant neoplasm of overlapping sites of left female breast: Secondary | ICD-10-CM

## 2024-09-05 DIAGNOSIS — R7309 Other abnormal glucose: Secondary | ICD-10-CM | POA: Diagnosis not present

## 2024-09-05 DIAGNOSIS — J3089 Other allergic rhinitis: Secondary | ICD-10-CM | POA: Diagnosis not present

## 2024-09-05 DIAGNOSIS — Z720 Tobacco use: Secondary | ICD-10-CM | POA: Diagnosis not present

## 2024-09-05 DIAGNOSIS — I878 Other specified disorders of veins: Secondary | ICD-10-CM | POA: Diagnosis not present

## 2024-09-05 DIAGNOSIS — E538 Deficiency of other specified B group vitamins: Secondary | ICD-10-CM | POA: Diagnosis not present

## 2024-09-05 DIAGNOSIS — R200 Anesthesia of skin: Secondary | ICD-10-CM | POA: Diagnosis not present

## 2024-09-05 DIAGNOSIS — M545 Low back pain, unspecified: Secondary | ICD-10-CM | POA: Diagnosis not present

## 2024-09-05 DIAGNOSIS — F3341 Major depressive disorder, recurrent, in partial remission: Secondary | ICD-10-CM | POA: Diagnosis not present

## 2024-09-05 DIAGNOSIS — C50812 Malignant neoplasm of overlapping sites of left female breast: Secondary | ICD-10-CM | POA: Diagnosis not present

## 2024-09-05 DIAGNOSIS — J449 Chronic obstructive pulmonary disease, unspecified: Secondary | ICD-10-CM | POA: Diagnosis not present

## 2024-09-05 DIAGNOSIS — G4700 Insomnia, unspecified: Secondary | ICD-10-CM | POA: Diagnosis not present

## 2024-09-05 DIAGNOSIS — I700 Atherosclerosis of aorta: Secondary | ICD-10-CM | POA: Diagnosis not present

## 2024-09-05 DIAGNOSIS — F17210 Nicotine dependence, cigarettes, uncomplicated: Secondary | ICD-10-CM | POA: Diagnosis not present

## 2024-09-06 DIAGNOSIS — M4800 Spinal stenosis, site unspecified: Secondary | ICD-10-CM | POA: Diagnosis not present

## 2024-09-06 DIAGNOSIS — F329 Major depressive disorder, single episode, unspecified: Secondary | ICD-10-CM | POA: Diagnosis not present

## 2024-09-06 DIAGNOSIS — F17210 Nicotine dependence, cigarettes, uncomplicated: Secondary | ICD-10-CM | POA: Diagnosis not present

## 2024-09-06 DIAGNOSIS — J449 Chronic obstructive pulmonary disease, unspecified: Secondary | ICD-10-CM | POA: Diagnosis not present

## 2024-09-06 DIAGNOSIS — R7303 Prediabetes: Secondary | ICD-10-CM | POA: Diagnosis not present

## 2024-09-06 DIAGNOSIS — R519 Headache, unspecified: Secondary | ICD-10-CM | POA: Diagnosis not present

## 2024-10-30 ENCOUNTER — Ambulatory Visit
Admission: RE | Admit: 2024-10-30 | Discharge: 2024-10-30 | Disposition: A | Discharge status: Home / Self Care | Source: Ambulatory Visit | Attending: Nurse Practitioner | Admitting: Nurse Practitioner

## 2024-10-30 DIAGNOSIS — Z170 Estrogen receptor positive status [ER+]: Secondary | ICD-10-CM | POA: Insufficient documentation

## 2024-10-30 DIAGNOSIS — C50812 Malignant neoplasm of overlapping sites of left female breast: Secondary | ICD-10-CM | POA: Insufficient documentation

## 2025-02-06 ENCOUNTER — Other Ambulatory Visit

## 2025-02-06 ENCOUNTER — Ambulatory Visit: Admitting: Internal Medicine
